# Patient Record
Sex: Female | Born: 1975
Health system: Southern US, Community
[De-identification: ages and names within clinical notes are randomized; demographics above are authoritative.]

## PROBLEM LIST (undated history)

## (undated) DIAGNOSIS — R51 Headache: Secondary | ICD-10-CM

## (undated) DIAGNOSIS — G629 Polyneuropathy, unspecified: Secondary | ICD-10-CM

## (undated) DIAGNOSIS — D649 Anemia, unspecified: Secondary | ICD-10-CM

## (undated) DIAGNOSIS — Z923 Personal history of irradiation: Secondary | ICD-10-CM

## (undated) DIAGNOSIS — M549 Dorsalgia, unspecified: Secondary | ICD-10-CM

## (undated) DIAGNOSIS — F329 Major depressive disorder, single episode, unspecified: Secondary | ICD-10-CM

## (undated) DIAGNOSIS — Z9221 Personal history of antineoplastic chemotherapy: Secondary | ICD-10-CM

## (undated) DIAGNOSIS — K259 Gastric ulcer, unspecified as acute or chronic, without hemorrhage or perforation: Secondary | ICD-10-CM

## (undated) DIAGNOSIS — C50919 Malignant neoplasm of unspecified site of unspecified female breast: Secondary | ICD-10-CM

## (undated) DIAGNOSIS — I10 Essential (primary) hypertension: Secondary | ICD-10-CM

## (undated) DIAGNOSIS — F32A Depression, unspecified: Secondary | ICD-10-CM

## (undated) DIAGNOSIS — Z8739 Personal history of other diseases of the musculoskeletal system and connective tissue: Secondary | ICD-10-CM

## (undated) DIAGNOSIS — E119 Type 2 diabetes mellitus without complications: Secondary | ICD-10-CM

## (undated) DIAGNOSIS — R05 Cough: Secondary | ICD-10-CM

## (undated) DIAGNOSIS — K219 Gastro-esophageal reflux disease without esophagitis: Secondary | ICD-10-CM

## (undated) DIAGNOSIS — M1990 Unspecified osteoarthritis, unspecified site: Secondary | ICD-10-CM

## (undated) HISTORY — DX: Depression, unspecified: F32.A

## (undated) HISTORY — DX: Dorsalgia, unspecified: M54.9

## (undated) HISTORY — DX: Malignant neoplasm of unspecified site of unspecified female breast: C50.919

## (undated) HISTORY — DX: Anemia, unspecified: D64.9

## (undated) HISTORY — DX: Personal history of other diseases of the musculoskeletal system and connective tissue: Z87.39

## (undated) HISTORY — PX: HIP PINNING: SHX1757

## (undated) HISTORY — DX: Type 2 diabetes mellitus without complications: E11.9

## (undated) HISTORY — DX: Headache: R51

## (undated) HISTORY — DX: Personal history of antineoplastic chemotherapy: Z92.21

## (undated) HISTORY — DX: Major depressive disorder, single episode, unspecified: F32.9

---

## 2006-09-25 ENCOUNTER — Emergency Department (HOSPITAL_COMMUNITY): Admission: EM | Admit: 2006-09-25 | Discharge: 2006-09-25 | Payer: Self-pay | Admitting: Emergency Medicine

## 2007-04-26 ENCOUNTER — Emergency Department (HOSPITAL_COMMUNITY): Admission: EM | Admit: 2007-04-26 | Discharge: 2007-04-27 | Payer: Self-pay | Admitting: Emergency Medicine

## 2007-05-16 ENCOUNTER — Ambulatory Visit: Payer: Self-pay | Admitting: Internal Medicine

## 2007-05-16 ENCOUNTER — Ambulatory Visit: Payer: Self-pay | Admitting: *Deleted

## 2007-05-16 LAB — CONVERTED CEMR LAB
Albumin: 3.9 g/dL (ref 3.5–5.2)
Alkaline Phosphatase: 87 units/L (ref 39–117)
BUN: 12 mg/dL (ref 6–23)
Glucose, Bld: 90 mg/dL (ref 70–99)
Sodium: 141 meq/L (ref 135–145)
Total Bilirubin: 0.6 mg/dL (ref 0.3–1.2)
Total CHOL/HDL Ratio: 3.7

## 2007-05-30 ENCOUNTER — Ambulatory Visit: Payer: Self-pay | Admitting: Internal Medicine

## 2007-06-27 ENCOUNTER — Ambulatory Visit: Payer: Self-pay | Admitting: Internal Medicine

## 2007-07-10 ENCOUNTER — Ambulatory Visit: Payer: Self-pay | Admitting: Family Medicine

## 2007-08-10 ENCOUNTER — Ambulatory Visit: Payer: Self-pay | Admitting: Internal Medicine

## 2007-08-10 LAB — CONVERTED CEMR LAB
Eosinophils Relative: 6 % — ABNORMAL HIGH (ref 0–5)
HCT: 35.2 % — ABNORMAL LOW (ref 36.0–46.0)
Lymphocytes Relative: 33 % (ref 12–46)
Lymphs Abs: 1.9 10*3/uL (ref 0.7–4.0)
Monocytes Absolute: 0.5 10*3/uL (ref 0.1–1.0)
RBC: 4.55 M/uL (ref 3.87–5.11)
TSH: 1.484 microintl units/mL (ref 0.350–5.50)

## 2007-08-13 ENCOUNTER — Ambulatory Visit: Payer: Self-pay | Admitting: Family Medicine

## 2007-09-03 ENCOUNTER — Ambulatory Visit: Payer: Self-pay | Admitting: Internal Medicine

## 2007-11-06 ENCOUNTER — Ambulatory Visit: Payer: Self-pay | Admitting: Internal Medicine

## 2008-02-20 ENCOUNTER — Emergency Department (HOSPITAL_COMMUNITY): Admission: EM | Admit: 2008-02-20 | Discharge: 2008-02-20 | Payer: Self-pay | Admitting: Emergency Medicine

## 2008-05-05 ENCOUNTER — Ambulatory Visit: Payer: Self-pay | Admitting: Internal Medicine

## 2008-05-05 LAB — CONVERTED CEMR LAB
Chlamydia, Swab/Urine, PCR: NEGATIVE
GC Probe Amp, Urine: NEGATIVE

## 2008-05-06 ENCOUNTER — Encounter (INDEPENDENT_AMBULATORY_CARE_PROVIDER_SITE_OTHER): Payer: Self-pay | Admitting: Internal Medicine

## 2008-05-26 ENCOUNTER — Ambulatory Visit: Payer: Self-pay | Admitting: Internal Medicine

## 2008-06-27 ENCOUNTER — Encounter (INDEPENDENT_AMBULATORY_CARE_PROVIDER_SITE_OTHER): Payer: Self-pay | Admitting: Adult Health

## 2008-06-27 ENCOUNTER — Ambulatory Visit: Payer: Self-pay | Admitting: Internal Medicine

## 2008-06-27 LAB — CONVERTED CEMR LAB
ALT: 13 units/L (ref 0–35)
Alkaline Phosphatase: 85 units/L (ref 39–117)
BUN: 11 mg/dL (ref 6–23)
Eosinophils Absolute: 0.2 10*3/uL (ref 0.0–0.7)
Eosinophils Relative: 4 % (ref 0–5)
Glucose, Bld: 68 mg/dL — ABNORMAL LOW (ref 70–99)
Lymphocytes Relative: 32 % (ref 12–46)
Lymphs Abs: 1.8 10*3/uL (ref 0.7–4.0)
MCV: 77.9 fL — ABNORMAL LOW (ref 78.0–100.0)
Neutro Abs: 3 10*3/uL (ref 1.7–7.7)
Total Bilirubin: 0.5 mg/dL (ref 0.3–1.2)
Total CHOL/HDL Ratio: 3.4
Triglycerides: 69 mg/dL (ref <150)

## 2008-10-13 ENCOUNTER — Ambulatory Visit: Payer: Self-pay | Admitting: Internal Medicine

## 2008-10-13 ENCOUNTER — Encounter (INDEPENDENT_AMBULATORY_CARE_PROVIDER_SITE_OTHER): Payer: Self-pay | Admitting: Adult Health

## 2008-10-13 LAB — CONVERTED CEMR LAB
ALT: 10 units/L (ref 0–35)
AST: 14 units/L (ref 0–37)
Basophils Absolute: 0 10*3/uL (ref 0.0–0.1)
Basophils Relative: 1 % (ref 0–1)
Chloride: 108 meq/L (ref 96–112)
Cortisol, Plasma: 5.8 ug/dL
Creatinine, Ser: 0.82 mg/dL (ref 0.40–1.20)
Eosinophils Absolute: 0.2 10*3/uL (ref 0.0–0.7)
HCT: 35.9 % — ABNORMAL LOW (ref 36.0–46.0)
Lymphocytes Relative: 28 % (ref 12–46)
Monocytes Absolute: 0.5 10*3/uL (ref 0.1–1.0)
Neutro Abs: 3.1 10*3/uL (ref 1.7–7.7)
Platelets: 366 10*3/uL (ref 150–400)
RBC: 4.53 M/uL (ref 3.87–5.11)
Sodium: 141 meq/L (ref 135–145)
TSH: 1.831 microintl units/mL (ref 0.350–4.50)
Total Protein: 7.1 g/dL (ref 6.0–8.3)

## 2009-01-02 ENCOUNTER — Inpatient Hospital Stay (HOSPITAL_COMMUNITY): Admission: AD | Admit: 2009-01-02 | Discharge: 2009-01-02 | Payer: Self-pay | Admitting: Obstetrics & Gynecology

## 2009-01-12 ENCOUNTER — Ambulatory Visit: Payer: Self-pay | Admitting: Internal Medicine

## 2009-02-12 ENCOUNTER — Ambulatory Visit: Payer: Self-pay | Admitting: Internal Medicine

## 2009-03-30 ENCOUNTER — Ambulatory Visit: Payer: Self-pay | Admitting: Family Medicine

## 2009-03-31 ENCOUNTER — Encounter (INDEPENDENT_AMBULATORY_CARE_PROVIDER_SITE_OTHER): Payer: Self-pay | Admitting: Adult Health

## 2009-07-01 ENCOUNTER — Ambulatory Visit: Payer: Self-pay | Admitting: Family Medicine

## 2009-07-01 ENCOUNTER — Encounter (INDEPENDENT_AMBULATORY_CARE_PROVIDER_SITE_OTHER): Payer: Self-pay | Admitting: Adult Health

## 2009-07-01 LAB — CONVERTED CEMR LAB
Albumin: 4 g/dL (ref 3.5–5.2)
Basophils Absolute: 0 10*3/uL (ref 0.0–0.1)
Basophils Relative: 1 % (ref 0–1)
CO2: 24 meq/L (ref 19–32)
Calcium: 9.5 mg/dL (ref 8.4–10.5)
Cholesterol: 176 mg/dL (ref 0–200)
Creatinine, Ser: 0.81 mg/dL (ref 0.40–1.20)
HCT: 36.5 % (ref 36.0–46.0)
HDL: 39 mg/dL — ABNORMAL LOW (ref >39)
MCHC: 30.1 g/dL (ref 30.0–36.0)
MCV: 81.1 fL (ref 78.0–100.0)
Monocytes Relative: 12 % (ref 3–12)
Neutrophils Relative %: 54 % (ref 43–77)
Platelets: 386 10*3/uL (ref 150–400)
Potassium: 4.4 meq/L (ref 3.5–5.3)
RBC: 4.5 M/uL (ref 3.87–5.11)
Total CHOL/HDL Ratio: 4.5
Total Protein: 7.1 g/dL (ref 6.0–8.3)
Triglycerides: 76 mg/dL (ref <150)
VLDL: 15 mg/dL (ref 0–40)

## 2009-09-03 ENCOUNTER — Ambulatory Visit: Payer: Self-pay | Admitting: Internal Medicine

## 2009-10-16 ENCOUNTER — Encounter (INDEPENDENT_AMBULATORY_CARE_PROVIDER_SITE_OTHER): Payer: Self-pay | Admitting: Adult Health

## 2009-10-16 ENCOUNTER — Ambulatory Visit: Payer: Self-pay | Admitting: Internal Medicine

## 2009-10-16 LAB — CONVERTED CEMR LAB
CO2: 27 meq/L (ref 19–32)
Potassium: 4.3 meq/L (ref 3.5–5.3)
Sodium: 141 meq/L (ref 135–145)

## 2009-11-11 ENCOUNTER — Inpatient Hospital Stay (HOSPITAL_COMMUNITY): Admission: AD | Admit: 2009-11-11 | Discharge: 2009-11-11 | Payer: Self-pay | Admitting: Obstetrics & Gynecology

## 2010-01-29 ENCOUNTER — Ambulatory Visit: Payer: Self-pay | Admitting: Adult Health

## 2010-02-05 ENCOUNTER — Ambulatory Visit: Payer: Self-pay | Admitting: Family Medicine

## 2010-02-11 ENCOUNTER — Ambulatory Visit: Payer: Self-pay | Admitting: Internal Medicine

## 2010-02-24 ENCOUNTER — Ambulatory Visit: Payer: Self-pay | Admitting: Internal Medicine

## 2010-02-24 ENCOUNTER — Encounter (INDEPENDENT_AMBULATORY_CARE_PROVIDER_SITE_OTHER): Payer: Self-pay | Admitting: Adult Health

## 2010-02-24 LAB — CONVERTED CEMR LAB
CO2: 24 meq/L (ref 19–32)
Calcium: 9.3 mg/dL (ref 8.4–10.5)
Chloride: 107 meq/L (ref 96–112)
Creatinine, Ser: 0.79 mg/dL (ref 0.40–1.20)
Glucose, Bld: 87 mg/dL (ref 70–99)
Potassium: 4.5 meq/L (ref 3.5–5.3)

## 2010-03-17 ENCOUNTER — Ambulatory Visit: Payer: Self-pay | Admitting: Internal Medicine

## 2010-04-07 ENCOUNTER — Ambulatory Visit: Payer: Self-pay | Admitting: Family Medicine

## 2010-08-22 DIAGNOSIS — C50919 Malignant neoplasm of unspecified site of unspecified female breast: Secondary | ICD-10-CM

## 2010-08-22 HISTORY — DX: Malignant neoplasm of unspecified site of unspecified female breast: C50.919

## 2010-09-12 ENCOUNTER — Encounter: Payer: Self-pay | Admitting: Internal Medicine

## 2010-10-26 ENCOUNTER — Emergency Department (HOSPITAL_COMMUNITY)
Admission: EM | Admit: 2010-10-26 | Discharge: 2010-10-27 | Disposition: A | Discharge status: Home / Self Care | Payer: BC Managed Care – PPO | Attending: Emergency Medicine | Admitting: Emergency Medicine

## 2010-10-26 DIAGNOSIS — R51 Headache: Secondary | ICD-10-CM | POA: Insufficient documentation

## 2010-10-26 DIAGNOSIS — I10 Essential (primary) hypertension: Secondary | ICD-10-CM | POA: Insufficient documentation

## 2010-10-26 LAB — CBC
HCT: 32.6 % — ABNORMAL LOW (ref 36.0–46.0)
MCHC: 31 g/dL (ref 30.0–36.0)
MCV: 75.3 fL — ABNORMAL LOW (ref 78.0–100.0)
Platelets: 392 10*3/uL (ref 150–400)
WBC: 5.7 10*3/uL (ref 4.0–10.5)

## 2010-10-26 LAB — DIFFERENTIAL
Lymphs Abs: 2 10*3/uL (ref 0.7–4.0)
Monocytes Relative: 11 % (ref 3–12)

## 2010-10-26 LAB — COMPREHENSIVE METABOLIC PANEL
ALT: 16 U/L (ref 0–35)
BUN: 12 mg/dL (ref 6–23)
CO2: 27 mEq/L (ref 19–32)
GFR calc Af Amer: >60 mL/min (ref >60)
Glucose, Bld: 81 mg/dL (ref 70–99)
Sodium: 141 mEq/L (ref 135–145)

## 2010-10-27 LAB — POCT PREGNANCY, URINE: Preg Test, Ur: NEGATIVE

## 2010-10-27 LAB — URINE MICROSCOPIC-ADD ON

## 2010-10-27 LAB — URINALYSIS, ROUTINE W REFLEX MICROSCOPIC
Bilirubin Urine: NEGATIVE
Glucose, UA: NEGATIVE mg/dL
Protein, ur: NEGATIVE mg/dL

## 2010-11-15 LAB — URINALYSIS, ROUTINE W REFLEX MICROSCOPIC
Bilirubin Urine: NEGATIVE
Glucose, UA: NEGATIVE mg/dL
Hgb urine dipstick: NEGATIVE
Ketones, ur: NEGATIVE mg/dL
Protein, ur: NEGATIVE mg/dL
pH: 6.5 (ref 5.0–8.0)

## 2010-11-15 LAB — DIFFERENTIAL
Basophils Relative: 1 % (ref 0–1)
Eosinophils Relative: 4 % (ref 0–5)
Monocytes Relative: 11 % (ref 3–12)
Neutrophils Relative %: 65 % (ref 43–77)

## 2010-11-15 LAB — WET PREP, GENITAL
Trich, Wet Prep: NONE SEEN
Yeast Wet Prep HPF POC: NONE SEEN

## 2010-11-15 LAB — CBC
HCT: 31.6 % — ABNORMAL LOW (ref 36.0–46.0)
Hemoglobin: 10.3 g/dL — ABNORMAL LOW (ref 12.0–15.0)
MCHC: 32.5 g/dL (ref 30.0–36.0)
Platelets: 279 10*3/uL (ref 150–400)
RBC: 4.02 MIL/uL (ref 3.87–5.11)
RDW: 15.7 % — ABNORMAL HIGH (ref 11.5–15.5)

## 2010-11-30 LAB — ABO/RH: ABO/RH(D): O POS

## 2010-11-30 LAB — CBC
MCHC: 32.8 g/dL (ref 30.0–36.0)
MCV: 78.3 fL (ref 78.0–100.0)
Platelets: 339 10*3/uL (ref 150–400)
RDW: 14.8 % (ref 11.5–15.5)

## 2010-11-30 LAB — URINALYSIS, ROUTINE W REFLEX MICROSCOPIC
Leukocytes, UA: NEGATIVE
Protein, ur: 30 mg/dL — AB
Specific Gravity, Urine: 1.025 (ref 1.005–1.030)
Urobilinogen, UA: 0.2 mg/dL (ref 0.0–1.0)

## 2010-11-30 LAB — URINE MICROSCOPIC-ADD ON

## 2010-11-30 LAB — HCG, QUANTITATIVE, PREGNANCY: hCG, Beta Chain, Quant, S: 21 m[IU]/mL — ABNORMAL HIGH (ref <5)

## 2010-11-30 LAB — GC/CHLAMYDIA PROBE AMP, GENITAL: Chlamydia, DNA Probe: NEGATIVE

## 2011-02-14 ENCOUNTER — Emergency Department (HOSPITAL_COMMUNITY)
Admission: EM | Admit: 2011-02-14 | Discharge: 2011-02-14 | Disposition: A | Discharge status: Home / Self Care | Payer: Medicaid Other | Attending: Emergency Medicine | Admitting: Emergency Medicine

## 2011-02-14 DIAGNOSIS — O9989 Other specified diseases and conditions complicating pregnancy, childbirth and the puerperium: Secondary | ICD-10-CM | POA: Insufficient documentation

## 2011-02-14 DIAGNOSIS — R51 Headache: Secondary | ICD-10-CM | POA: Insufficient documentation

## 2011-02-14 DIAGNOSIS — O169 Unspecified maternal hypertension, unspecified trimester: Secondary | ICD-10-CM | POA: Insufficient documentation

## 2011-02-14 DIAGNOSIS — Z79899 Other long term (current) drug therapy: Secondary | ICD-10-CM | POA: Insufficient documentation

## 2011-02-27 ENCOUNTER — Inpatient Hospital Stay (HOSPITAL_COMMUNITY): Payer: Medicaid Other | Admitting: Obstetrics & Gynecology

## 2011-02-27 ENCOUNTER — Inpatient Hospital Stay (HOSPITAL_COMMUNITY): Payer: Medicaid Other

## 2011-02-27 ENCOUNTER — Other Ambulatory Visit: Payer: Self-pay | Admitting: Obstetrics and Gynecology

## 2011-02-27 ENCOUNTER — Encounter (HOSPITAL_COMMUNITY): Payer: Self-pay

## 2011-02-27 ENCOUNTER — Inpatient Hospital Stay (HOSPITAL_COMMUNITY)
Admission: AD | Admit: 2011-02-27 | Discharge: 2011-02-28 | DRG: 779 | Disposition: A | Discharge status: Home / Self Care | Payer: Medicaid Other | Source: Ambulatory Visit | Attending: Obstetrics and Gynecology | Admitting: Obstetrics and Gynecology

## 2011-02-27 DIAGNOSIS — O034 Incomplete spontaneous abortion without complication: Principal | ICD-10-CM | POA: Diagnosis present

## 2011-02-27 DIAGNOSIS — O3430 Maternal care for cervical incompetence, unspecified trimester: Secondary | ICD-10-CM | POA: Diagnosis present

## 2011-02-27 DIAGNOSIS — O99019 Anemia complicating pregnancy, unspecified trimester: Secondary | ICD-10-CM | POA: Diagnosis present

## 2011-02-27 DIAGNOSIS — O10019 Pre-existing essential hypertension complicating pregnancy, unspecified trimester: Secondary | ICD-10-CM | POA: Diagnosis present

## 2011-02-27 DIAGNOSIS — D649 Anemia, unspecified: Secondary | ICD-10-CM | POA: Diagnosis present

## 2011-02-27 DIAGNOSIS — I10 Essential (primary) hypertension: Secondary | ICD-10-CM

## 2011-02-27 HISTORY — DX: Essential (primary) hypertension: I10

## 2011-02-27 LAB — COMPREHENSIVE METABOLIC PANEL
AST: 15 U/L (ref 0–37)
CO2: 22 mEq/L (ref 19–32)
Chloride: 105 mEq/L (ref 96–112)
Creatinine, Ser: 0.55 mg/dL (ref 0.50–1.10)
GFR calc non Af Amer: >60 mL/min (ref >60)
Glucose, Bld: 80 mg/dL (ref 70–99)
Total Bilirubin: 0.3 mg/dL (ref 0.3–1.2)

## 2011-02-27 LAB — DIFFERENTIAL
Basophils Absolute: 0 10*3/uL (ref 0.0–0.1)
Lymphocytes Relative: 18 % (ref 12–46)
Lymphs Abs: 1 10*3/uL (ref 0.7–4.0)
Monocytes Absolute: 0.4 10*3/uL (ref 0.1–1.0)
Monocytes Relative: 8 % (ref 3–12)
Neutro Abs: 3.8 10*3/uL (ref 1.7–7.7)

## 2011-02-27 LAB — CBC
HCT: 31.3 % — ABNORMAL LOW (ref 36.0–46.0)
Hemoglobin: 9.8 g/dL — ABNORMAL LOW (ref 12.0–15.0)
RBC: 4.07 MIL/uL (ref 3.87–5.11)
RDW: 15.5 % (ref 11.5–15.5)
WBC: 5.4 10*3/uL (ref 4.0–10.5)

## 2011-02-27 MED ORDER — DIPHENHYDRAMINE HCL 25 MG PO CAPS: 25.0000 mg | ORAL_CAPSULE | Freq: Four times a day (QID) | ORAL | Status: DC | PRN

## 2011-02-27 MED ORDER — TETANUS-DIPHTH-ACELL PERTUSSIS 5-2.5-18.5 LF-MCG/0.5 IM SUSP
0.5000 mL | Freq: Once | INTRAMUSCULAR | Status: AC
  Administered 2011-02-28: 0.5 mL via INTRAMUSCULAR
  Filled 2011-02-27: qty 0.5

## 2011-02-27 MED ORDER — SENNOSIDES-DOCUSATE SODIUM 8.6-50 MG PO TABS
1.0000 | ORAL_TABLET | Freq: Every day | ORAL | Status: DC
  Administered 2011-02-27: 2 via ORAL
  Filled 2011-02-27: qty 2

## 2011-02-27 MED ORDER — ONDANSETRON HCL 4 MG/2ML IJ SOLN: 4.0000 mg | INTRAMUSCULAR | Status: DC | PRN

## 2011-02-27 MED ORDER — FERROUS SULFATE 325 (65 FE) MG PO TABS
325.0000 mg | ORAL_TABLET | Freq: Two times a day (BID) | ORAL | Status: DC
  Administered 2011-02-27: 325 mg via ORAL
  Filled 2011-02-27: qty 1

## 2011-02-27 MED ORDER — OXYTOCIN 20 UNITS IN LACTATED RINGERS INFUSION - SIMPLE
125.0000 mL/h | INTRAVENOUS | Status: DC
  Filled 2011-02-27: qty 1000

## 2011-02-27 MED ORDER — SIMETHICONE 80 MG PO CHEW: 80.0000 mg | CHEWABLE_TABLET | ORAL | Status: DC | PRN

## 2011-02-27 MED ORDER — BENZOCAINE-MENTHOL 20-0.5 % EX AERO: 1.0000 "application " | INHALATION_SPRAY | CUTANEOUS | Status: DC | PRN

## 2011-02-27 MED ORDER — SODIUM CHLORIDE 0.9 % IJ SOLN
3.0000 mL | INTRAMUSCULAR | Status: DC | PRN
  Administered 2011-02-27: 3 mL via INTRAVENOUS

## 2011-02-27 MED ORDER — OXYTOCIN 20 UNITS IN LACTATED RINGERS INFUSION - SIMPLE
INTRAVENOUS | Status: AC
  Filled 2011-02-27: qty 1000

## 2011-02-27 MED ORDER — KETOROLAC TROMETHAMINE 30 MG/ML IM SOLN
30.0000 mg | Freq: Four times a day (QID) | INTRAMUSCULAR | Status: DC | PRN
  Filled 2011-02-27: qty 1

## 2011-02-27 MED ORDER — OXYCODONE-ACETAMINOPHEN 5-325 MG PO TABS: 1.0000 | ORAL_TABLET | ORAL | Status: DC | PRN

## 2011-02-27 MED ORDER — ONDANSETRON HCL 4 MG PO TABS: 4.0000 mg | ORAL_TABLET | ORAL | Status: DC | PRN

## 2011-02-27 MED ORDER — KETOROLAC TROMETHAMINE 30 MG/ML IJ SOLN
INTRAMUSCULAR | Status: AC
  Administered 2011-02-27: 30 mg via INTRAVENOUS
  Filled 2011-02-27: qty 1

## 2011-02-27 MED ORDER — WITCH HAZEL-GLYCERIN EX PADS: MEDICATED_PAD | CUTANEOUS | Status: DC | PRN

## 2011-02-27 MED ORDER — PRENATAL PLUS 27-1 MG PO TABS: 1.0000 | ORAL_TABLET | Freq: Every day | ORAL | Status: DC

## 2011-02-27 MED ORDER — OXYTOCIN 20 UNITS IN LACTATED RINGERS INFUSION - SIMPLE
20.0000 [IU] | INTRAVENOUS | Status: DC
  Administered 2011-02-27: 20 [IU] via INTRAVENOUS
  Filled 2011-02-27: qty 1000

## 2011-02-27 MED ORDER — IBUPROFEN 600 MG PO TABS
600.0000 mg | ORAL_TABLET | Freq: Four times a day (QID) | ORAL | Status: DC
  Administered 2011-02-27 – 2011-02-28 (×3): 600 mg via ORAL
  Filled 2011-02-27 (×3): qty 1

## 2011-02-27 MED ORDER — SODIUM CHLORIDE 0.9 % IJ SOLN: 3.0000 mL | Freq: Two times a day (BID) | INTRAMUSCULAR | Status: DC

## 2011-02-27 MED ORDER — METHYLDOPA 500 MG PO TABS
500.0000 mg | ORAL_TABLET | Freq: Three times a day (TID) | ORAL | Status: DC
  Administered 2011-02-27 – 2011-02-28 (×3): 500 mg via ORAL
  Filled 2011-02-27: qty 1

## 2011-02-27 MED ORDER — OXYTOCIN 20 UNITS IN LACTATED RINGERS INFUSION - SIMPLE: 125.0000 mL/h | INTRAVENOUS | Status: DC

## 2011-02-27 MED ORDER — ZOLPIDEM TARTRATE 5 MG PO TABS: 5.0000 mg | ORAL_TABLET | Freq: Every evening | ORAL | Status: DC | PRN

## 2011-02-27 MED ORDER — PRENATAL PLUS 27-1 MG PO TABS
1.0000 | ORAL_TABLET | Freq: Every day | ORAL | Status: DC
  Administered 2011-02-27 – 2011-02-28 (×2): 1 via ORAL
  Filled 2011-02-27 (×2): qty 1

## 2011-02-27 NOTE — Progress Notes (Signed)
Got up to go to the bathroom and felt blood running down leg, no pain, constipation small BM last night

## 2011-02-27 NOTE — Plan of Care (Signed)
Problem: Phase II Progression Outcomes Goal: Progress activity as tolerated unless otherwise ordered Outcome: Progressing Patient ambulated to bathroom with assist.Patient stated was a little dizzy but tolerated well Goal: Rh isoimmunization per orders Outcome: Not Applicable Patient is A+ and does not need Rhogam Goal: Tolerating diet Outcome: Not Progressing Patient is upset over loss of infant and does not want to eat.Patient was encouraged to at least drink juice so she was given grape juice and crackers and is tolerating the juice
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
## 2011-02-27 NOTE — Plan of Care (Signed)
Problem: Phase II Progression Outcomes Goal: Rh isoimmunization per orders Outcome: Completed/Met Date Met:  02/27/11 Patient is A+ does not require Rhogam

## 2011-02-27 NOTE — ED Notes (Signed)
Pt arrived in MAU via EMS, c/o vag bleeding at home.  Eustace Pen, CNM in to see pt, performed spec exam, noted bulging BOW at vaginal introitus.  U/s called and in to perform bedside u/s.  Pt spontaneously delivered fetus during u/s.  Marlynn Perking CNM in during delivery, Steelman called to come to bedside, who notified Dr. Estanislado Pandy.  Delivery of non-viable fetus occurred at 1355 with no signs of life.  Dr. Estanislado Pandy in to see pt, assessed status of placenta.  IV started with 18 g in right AC with LR bolus.  Toradol given at 1415 for c/o pain/cramping.  New bag of IVF of LR with 20 u Pitocin hung to infuse at bolus rate.  Delivered placenta at 1300, clean linens given, pericare given.  Bleeding reassessed to be small amt bright red.  Report given to Selena Batten, RN in [employer].  Pt taken to room 305 at 1540 via stretcher.
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
## 2011-02-27 NOTE — Progress Notes (Signed)
S: Called by CNM to come see pt who arrived by EMS with complaint of painless bleeding at 18 weeks.  Upon arrival  membranes were bulging at introitus.Before ultrasound could be performed, patient passed fetus at 13:55. 3 tight nuchal cord loops were noted. Cord was clamped and cut.  Upon my arrival, pt is c/o cramping. Mild to moderate bleeding.   O: Speculum exam revealed clots++++.   Placenta in cervical os removed with ring forceps.  No active bleeding noted.  A: Unexplained 2nd trimester fetal loss c/w  Cervical incompetence  P: Admit to Women's Unit  CBC, CMP  Placenta to pathology to r/o chorioamnionitis

## 2011-02-28 DIAGNOSIS — I10 Essential (primary) hypertension: Secondary | ICD-10-CM | POA: Insufficient documentation

## 2011-02-28 LAB — CBC
MCHC: 31.9 g/dL (ref 30.0–36.0)
MCV: 76.9 fL — ABNORMAL LOW (ref 78.0–100.0)
Platelets: 250 10*3/uL (ref 150–400)
RDW: 15.6 % — ABNORMAL HIGH (ref 11.5–15.5)
WBC: 5.8 10*3/uL (ref 4.0–10.5)

## 2011-02-28 MED ORDER — SENNOSIDES-DOCUSATE SODIUM 8.6-50 MG PO TABS: 1.0000 | ORAL_TABLET | Freq: Every day | ORAL | Status: DC

## 2011-02-28 MED ORDER — IBUPROFEN 600 MG PO TABS: 600.0000 mg | ORAL_TABLET | Freq: Four times a day (QID) | ORAL | Status: AC

## 2011-02-28 MED ORDER — BENZOCAINE-MENTHOL 20-0.5 % EX AERO: 1.0000 "application " | INHALATION_SPRAY | CUTANEOUS | Status: DC | PRN

## 2011-02-28 MED ORDER — FERROUS SULFATE 325 (65 FE) MG PO TABS: 325.0000 mg | ORAL_TABLET | Freq: Two times a day (BID) | ORAL | Status: DC

## 2011-02-28 MED ORDER — METHYLDOPA 500 MG PO TABS: 500.0000 mg | ORAL_TABLET | Freq: Three times a day (TID) | ORAL | Status: DC

## 2011-03-13 ENCOUNTER — Emergency Department (HOSPITAL_COMMUNITY)
Admission: EM | Admit: 2011-03-13 | Discharge: 2011-03-14 | Disposition: A | Discharge status: Home / Self Care | Payer: Medicaid Other | Attending: Emergency Medicine | Admitting: Emergency Medicine

## 2011-03-13 DIAGNOSIS — Z79899 Other long term (current) drug therapy: Secondary | ICD-10-CM | POA: Insufficient documentation

## 2011-03-13 DIAGNOSIS — J3489 Other specified disorders of nose and nasal sinuses: Secondary | ICD-10-CM | POA: Insufficient documentation

## 2011-03-13 DIAGNOSIS — R51 Headache: Secondary | ICD-10-CM | POA: Insufficient documentation

## 2011-03-13 DIAGNOSIS — I10 Essential (primary) hypertension: Secondary | ICD-10-CM | POA: Insufficient documentation

## 2011-03-14 ENCOUNTER — Other Ambulatory Visit: Payer: Self-pay | Admitting: Radiology

## 2011-03-15 ENCOUNTER — Other Ambulatory Visit: Payer: Self-pay | Admitting: Radiology

## 2011-03-15 DIAGNOSIS — C50911 Malignant neoplasm of unspecified site of right female breast: Secondary | ICD-10-CM

## 2011-03-16 ENCOUNTER — Other Ambulatory Visit (INDEPENDENT_AMBULATORY_CARE_PROVIDER_SITE_OTHER): Payer: Self-pay | Admitting: General Surgery

## 2011-03-16 DIAGNOSIS — C50919 Malignant neoplasm of unspecified site of unspecified female breast: Secondary | ICD-10-CM | POA: Insufficient documentation

## 2011-03-18 ENCOUNTER — Ambulatory Visit
Admission: RE | Admit: 2011-03-18 | Discharge: 2011-03-18 | Disposition: A | Discharge status: Home / Self Care | Payer: Medicaid Other | Source: Ambulatory Visit | Attending: Radiology | Admitting: Radiology

## 2011-03-18 DIAGNOSIS — C50911 Malignant neoplasm of unspecified site of right female breast: Secondary | ICD-10-CM

## 2011-03-18 MED ORDER — GADOBENATE DIMEGLUMINE 529 MG/ML IV SOLN
20.0000 mL | Freq: Once | INTRAVENOUS | Status: AC | PRN
  Administered 2011-03-18: 20 mL via INTRAVENOUS

## 2011-03-22 NOTE — Discharge Summary (Signed)
  Obstetric Discharge Summary Reason for Admission: onset of labor Prenatal Procedures: ultrasound Intrapartum Procedures: spontaneous vaginal delivery of non-viable 18.1 wk fetus Postpartum Procedures: none Complications-Operative and Postpartum: none    Hemoglobin  Date Value Range Status  02/28/2011 8.2* 12.0-15.0 (g/dL) Final     HCT  Date Value Range Status  02/28/2011 25.7* 36.0-46.0 (%) Final    Hospital Course:  Presented to MAU by EMS with BRVB. SVD in MAU of non-viable 18.1 wk fetus. Desired d/c home next day after normal pp course.   Discharge Diagnoses: Premature labor and SVD @ 18.1 wks  Discharge Information: Date: 03/22/2011 Activity: pelvic rest Diet: routine Medications:  Medication List  As of 03/22/2011  8:46 PM   START taking these medications         benzocaine-Menthol 20-0.5 % Aero   Commonly known as: DERMOPLAST   Apply 1 application topically as needed (perineal discomfort).      ferrous sulfate 325 (65 FE) MG tablet   Take 1 tablet (325 mg total) by mouth 2 (two) times daily with a meal.      methyldopa 500 MG tablet   Commonly known as: ALDOMET   Take 1 tablet (500 mg total) by mouth 3 (three) times daily.      senna-docusate 8.6-50 MG per tablet   Commonly known as: Senokot-S   Take 1-2 tablets by mouth at bedtime.          Where to get your medications    These are the prescriptions that you need to pick up.   You may get these medications from any pharmacy.         benzocaine-Menthol 20-0.5 % Aero   ferrous sulfate 325 (65 FE) MG tablet   methyldopa 500 MG tablet   senna-docusate 8.6-50 MG per tablet           Condition: stable Instructions: refer to practice specific booklet Discharge to: home   Newborn Data: Non-viable This patient has no babies on file.; APGAR 0/0 , ; weight ;  Home with Infant non-viable.  [name] 03/22/2011, 8:46 PM

## 2011-03-23 ENCOUNTER — Other Ambulatory Visit: Payer: Self-pay | Admitting: Oncology

## 2011-03-23 ENCOUNTER — Ambulatory Visit (HOSPITAL_BASED_OUTPATIENT_CLINIC_OR_DEPARTMENT_OTHER): Payer: Medicaid Other | Admitting: General Surgery

## 2011-03-23 ENCOUNTER — Encounter (INDEPENDENT_AMBULATORY_CARE_PROVIDER_SITE_OTHER): Payer: Self-pay | Admitting: General Surgery

## 2011-03-23 ENCOUNTER — Encounter (HOSPITAL_BASED_OUTPATIENT_CLINIC_OR_DEPARTMENT_OTHER): Payer: Medicaid Other | Admitting: Oncology

## 2011-03-23 VITALS — BP 170/117 | HR 74 | Temp 98.7°F | Resp 20 | Ht 66.5 in | Wt 297.1 lb

## 2011-03-23 DIAGNOSIS — C50919 Malignant neoplasm of unspecified site of unspecified female breast: Secondary | ICD-10-CM

## 2011-03-23 DIAGNOSIS — C50419 Malignant neoplasm of upper-outer quadrant of unspecified female breast: Secondary | ICD-10-CM

## 2011-03-23 LAB — CBC WITH DIFFERENTIAL/PLATELET
BASO%: 0.5 % (ref 0.0–2.0)
EOS%: 7.4 % — ABNORMAL HIGH (ref 0.0–7.0)
HCT: 32.3 % — ABNORMAL LOW (ref 34.8–46.6)
LYMPH%: 28.2 % (ref 14.0–49.7)
MCH: 23.7 pg — ABNORMAL LOW (ref 25.1–34.0)
MCHC: 31 g/dL — ABNORMAL LOW (ref 31.5–36.0)
NEUT%: 55.3 % (ref 38.4–76.8)
RBC: 4.22 10*6/uL (ref 3.70–5.45)
lymph#: 1.2 10*3/uL (ref 0.9–3.3)

## 2011-03-23 LAB — COMPREHENSIVE METABOLIC PANEL
ALT: 12 U/L (ref 0–35)
AST: 17 U/L (ref 0–37)
Creatinine, Ser: 0.75 mg/dL (ref 0.50–1.10)
Sodium: 139 mEq/L (ref 135–145)
Total Bilirubin: 0.5 mg/dL (ref 0.3–1.2)

## 2011-03-23 NOTE — Progress Notes (Signed)
Subjective:     Patient ID: [name], female   DOB: February 15, 1976, 35 y.o.   MRN: [id number]  HPI We are asked to see the patient in consultation by Dr. Isabell Jarvis to evaluate her for a right breast cancer. The patient is a 35 year old black female who recently felt a large mass in the upper outer aspect of the right breast. She has had some discomfort associated with it. She has not had any discharge from her nipple in the last 2-3 years and her previous discharge was whitish. The mass was biopsied and came back as invasive ductal cancer. She has not had any breast problems in the past. She denies any nausea or vomiting. No fevers or chills. No chest pain or shortness of breath.  Review of Systems  Constitutional: Negative.   HENT: Negative.   Eyes: Negative.   Respiratory: Negative.   Cardiovascular: Negative.   Gastrointestinal: Negative.   Genitourinary: Negative.   Musculoskeletal: Negative.   Skin: Negative.   Neurological: Negative.   Hematological: Negative.   Psychiatric/Behavioral: Negative.    Past Medical History  Diagnosis Date  . Hypertension   . Night sweats   . Insomnia   . Breast pain   . Abdominal pain   . Neck pain   . Back pain   . Wears glasses   . Sinus complaint   . Sore throat   . Hoarseness of voice   . Heart palpitations   . Shortness of breath   . Nausea & vomiting   . Heartburn   . Ulcer of abdomen wall   . Headache   . Depression   . Anemia    Past Surgical History  Procedure Date  . Hip pins 35 y/o    pins placed in both hips    Current outpatient prescriptions:methyldopa (ALDOMET) 500 MG tablet, Take 1 tablet (500 mg total) by mouth 3 (three) times daily., Disp: 90 tablet, Rfl: 1;  ranitidine (ZANTAC) 15 MG/ML syrup, Take 50 mg by mouth 2 (two) times daily.  , Disp: , Rfl: ;  benzocaine-Menthol (DERMOPLAST) 20-0.5 % AERO, Apply 1 application topically as needed (perineal discomfort)., Disp: 1 each, Rfl: 1 ferrous sulfate 325 (65 FE) MG  tablet, Take 1 tablet (325 mg total) by mouth 2 (two) times daily with a meal., Disp: 60 tablet, Rfl: 1;  senna-docusate (SENOKOT-S) 8.6-50 MG per tablet, Take 1-2 tablets by mouth at bedtime., Disp: 20 tablet, Rfl: 0  No Known Allergies     Objective:   Physical Exam  Constitutional: She is oriented to person, place, and time. She appears well-developed and well-nourished.  HENT:  Head: Normocephalic and atraumatic.  Eyes: Conjunctivae and EOM are normal. Pupils are equal, round, and reactive to light.  Neck: Normal range of motion. Neck supple.  Cardiovascular: Normal rate, regular rhythm and normal heart sounds.   Pulmonary/Chest: Effort normal and breath sounds normal.       She has a large 4-5 cm mass in the upper outer aspect of the right breast. It is mobile and not tethered to the skin or the chest wall. No palpable mass in the left breast. No axillary supraclavicular or cervical lymphadenopathy on either side.  Abdominal: Soft. Bowel sounds are normal.  Musculoskeletal: Normal range of motion.  Neurological: She is alert and oriented to person, place, and time.  Skin: Skin is warm and dry.  Psychiatric: She has a normal mood and affect. Her behavior is normal.       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    Plan:     At this point I think she would be a very good candidate for neoadjuvant chemotherapy. If she has a good response then she may have the option of both breast conservation and mastectomy in the future. She will also need a port for her treatment. I have discussed with her in detail the risk and benefits of the surgery to put the port inas well as some of the technical aspects and she understands and wishes to proceed.

## 2011-03-23 DEATH — deceased

## 2011-03-24 ENCOUNTER — Ambulatory Visit (HOSPITAL_COMMUNITY)
Admission: RE | Admit: 2011-03-24 | Discharge: 2011-03-24 | Disposition: A | Discharge status: Home / Self Care | Payer: Medicaid Other | Source: Ambulatory Visit | Attending: Oncology | Admitting: Oncology

## 2011-03-24 DIAGNOSIS — C50919 Malignant neoplasm of unspecified site of unspecified female breast: Secondary | ICD-10-CM | POA: Insufficient documentation

## 2011-03-24 DIAGNOSIS — Z01810 Encounter for preprocedural cardiovascular examination: Secondary | ICD-10-CM | POA: Insufficient documentation

## 2011-03-28 ENCOUNTER — Encounter: Payer: Medicaid Other | Admitting: Genetic Counselor

## 2011-03-31 ENCOUNTER — Encounter (HOSPITAL_COMMUNITY): Payer: Self-pay

## 2011-03-31 ENCOUNTER — Encounter (HOSPITAL_COMMUNITY)
Admission: RE | Admit: 2011-03-31 | Discharge: 2011-03-31 | Disposition: A | Discharge status: Home / Self Care | Payer: Medicaid Other | Source: Ambulatory Visit | Attending: Oncology | Admitting: Oncology

## 2011-03-31 DIAGNOSIS — C50919 Malignant neoplasm of unspecified site of unspecified female breast: Secondary | ICD-10-CM

## 2011-03-31 DIAGNOSIS — R599 Enlarged lymph nodes, unspecified: Secondary | ICD-10-CM | POA: Insufficient documentation

## 2011-03-31 MED ORDER — IOHEXOL 300 MG/ML  SOLN
100.0000 mL | Freq: Once | INTRAMUSCULAR | Status: AC | PRN
  Administered 2011-03-31: 100 mL via INTRAVENOUS

## 2011-03-31 MED ORDER — FLUDEOXYGLUCOSE F - 18 (FDG) INJECTION
17.0000 | Freq: Once | INTRAVENOUS | Status: AC | PRN
  Administered 2011-03-31: 17 via INTRAVENOUS

## 2011-04-06 ENCOUNTER — Encounter (HOSPITAL_BASED_OUTPATIENT_CLINIC_OR_DEPARTMENT_OTHER)
Admission: RE | Admit: 2011-04-06 | Discharge: 2011-04-06 | Disposition: A | Discharge status: Home / Self Care | Payer: Medicaid Other | Source: Ambulatory Visit | Attending: General Surgery | Admitting: General Surgery

## 2011-04-06 ENCOUNTER — Ambulatory Visit
Admission: RE | Admit: 2011-04-06 | Discharge: 2011-04-06 | Disposition: A | Discharge status: Home / Self Care | Payer: Medicaid Other | Source: Ambulatory Visit | Attending: General Surgery | Admitting: General Surgery

## 2011-04-06 ENCOUNTER — Other Ambulatory Visit (INDEPENDENT_AMBULATORY_CARE_PROVIDER_SITE_OTHER): Payer: Self-pay | Admitting: General Surgery

## 2011-04-06 DIAGNOSIS — Z01818 Encounter for other preprocedural examination: Secondary | ICD-10-CM

## 2011-04-06 LAB — BASIC METABOLIC PANEL
BUN: 10 mg/dL (ref 6–23)
CO2: 27 mEq/L (ref 19–32)
Chloride: 108 mEq/L (ref 96–112)
Glucose, Bld: 114 mg/dL — ABNORMAL HIGH (ref 70–99)
Potassium: 4 mEq/L (ref 3.5–5.1)

## 2011-04-08 ENCOUNTER — Ambulatory Visit (HOSPITAL_BASED_OUTPATIENT_CLINIC_OR_DEPARTMENT_OTHER)
Admission: RE | Admit: 2011-04-08 | Discharge: 2011-04-08 | Disposition: A | Discharge status: Home / Self Care | Payer: Medicaid Other | Source: Ambulatory Visit | Attending: General Surgery | Admitting: General Surgery

## 2011-04-08 ENCOUNTER — Ambulatory Visit (HOSPITAL_COMMUNITY): Payer: Medicaid Other

## 2011-04-08 ENCOUNTER — Ambulatory Visit (HOSPITAL_COMMUNITY): Payer: Medicaid Other | Attending: General Surgery

## 2011-04-08 DIAGNOSIS — E669 Obesity, unspecified: Secondary | ICD-10-CM | POA: Insufficient documentation

## 2011-04-08 DIAGNOSIS — Z01818 Encounter for other preprocedural examination: Secondary | ICD-10-CM | POA: Insufficient documentation

## 2011-04-08 DIAGNOSIS — C50919 Malignant neoplasm of unspecified site of unspecified female breast: Secondary | ICD-10-CM | POA: Insufficient documentation

## 2011-04-08 DIAGNOSIS — I10 Essential (primary) hypertension: Secondary | ICD-10-CM | POA: Insufficient documentation

## 2011-04-08 DIAGNOSIS — Z01810 Encounter for preprocedural cardiovascular examination: Secondary | ICD-10-CM | POA: Insufficient documentation

## 2011-04-08 DIAGNOSIS — Z01812 Encounter for preprocedural laboratory examination: Secondary | ICD-10-CM | POA: Insufficient documentation

## 2011-04-08 HISTORY — PX: PORTACATH PLACEMENT: SHX2246

## 2011-04-08 LAB — POCT HEMOGLOBIN-HEMACUE: Hemoglobin: 9.6 g/dL — ABNORMAL LOW (ref 12.0–15.0)

## 2011-04-13 ENCOUNTER — Encounter (HOSPITAL_BASED_OUTPATIENT_CLINIC_OR_DEPARTMENT_OTHER): Payer: Medicaid Other | Admitting: Oncology

## 2011-04-13 DIAGNOSIS — C50419 Malignant neoplasm of upper-outer quadrant of unspecified female breast: Secondary | ICD-10-CM

## 2011-04-19 NOTE — Op Note (Addendum)
[name], [name]                 ACCOUNT NO.:  [id number]  MEDICAL RECORD NO.:  [id number]  LOCATION:                                 FACILITY:  PHYSICIAN:  [name]. [name], M.D. DATE OF BIRTH:  [birth year]  DATE OF PROCEDURE:  04/08/2011 DATE OF DISCHARGE:                              OPERATIVE REPORT   PREOPERATIVE DIAGNOSIS:  Locally advanced right breast cancer.  POSTOPERATIVE DIAGNOSIS:  Locally advanced right breast cancer.  PROCEDURE:  Placement of left subclavian vein Port-A-Cath.  SURGEON:  [name]. [name], MD  ANESTHESIA:  General endotracheal.  PROCEDURE:  After informed consent was obtained, the patient was brought to the operating room, placed in supine position on the operating table. After adequate induction of general anesthesia, roll was placed between the patient's shoulder blades to extend the shoulder slightly.  Her left chest and neck area were then prepped with ChloraPrep, allowed to dry and draped in usual sterile manner.  The patient was placed in Trendelenburg position just lateral to the bend of the clavicle.  The chest wall was infiltrated with 0.25% Marcaine.  A small incision was made with #15 blade knife.  A large bore finder needle was then placed through the incision.  It was angled.  It was directed beneath the bend of the clavicle towards the sternal notch and in doing this, we were able to identify the left subclavian vein without difficulty.  A wire was placed through the needle using the Seldinger technique without difficulty.  The wire was confirmed in the central venous system using real time fluoroscopy.  At this point, the incision on the left chest wall was extended medially and laterally, a short distance with a 15 blade knife.  A subcutaneous pocket was then created inferior to the incision using a combination of blunt finger dissection and some sharp dissection with the electrocautery.  The tubing was placed on the reservoir.   The reservoir was placed in the pocket and the length of the tubing was estimated again using real time fluoroscopy.  At this point, attempt was made to place the sheath and dilator over the wire using the Seldinger technique.  At some point, it appeared as though the wire had been pulled back and came out of the vein.  A new kit was obtained.  A new needle was used to go behind the bend of the clavicle aiming towards the sternal notch and again without difficulty.  We were able to find the left subclavian vein.  The wire was fed through the needle again without difficulty using the Seldinger technique.  The sheath and dilator were then placed over the wire again using the Seldinger technique without difficulty and then the dilator and wire were removed. The thumb was placed over the opening of the sheath.  Tubing was then fed through the sheath as far as it could be fed.  The sheath was gently cracked and separated keeping the tubing in place.  Once this was accomplished, another fluoro image showed that the catheter unfortunately was curled up in the right subclavian vein as we pulled the catheter back.  We were unable to  recede it under the clavicle.  We had disconnected its attachment from the reservoir and placed a wire back down the tubing so that it was stiff enough to refeed and in doing this we were able to under direct fluoro vision feed it into the superior vena cava.  The wire was then removed.  Some of the tubing was trimmed back externally, so it was at the proper length.  The tubing was then replaced on the reservoir and the anchor was also placed over the tubing onto the reservoir.  The reservoir was then placed in pocket and anchored in the pocket with two 2-0 Prolene stitches.  The reservoir was then aspirated and it appeared to aspirate blood fairly easily.  It was then flushed initially with a dilute heparin solution then with a more concentrated heparin solution.  The  subcutaneous tissue was then closed over the port with interrupted 3-0 Vicryl stitches and the skin was closed with a running 4-0 Monocryl subcuticular stitch.  Dermabond dressing was applied.  The patient tolerated the procedure well.  At the end of the case, all needle, sponge, and instrument counts were correct. The patient was then awakened and taken to recovery in stable condition.     [name]. [name], M.D.     PST/MEDQ  D:  04/08/2011  T:  04/08/2011  Job:  [id number]  Electronically Signed by Chevis Pretty III M.D. on 04/19/2011 07:55:52 AM

## 2011-04-21 ENCOUNTER — Encounter: Payer: Medicaid Other | Admitting: Oncology

## 2011-04-28 ENCOUNTER — Encounter (HOSPITAL_BASED_OUTPATIENT_CLINIC_OR_DEPARTMENT_OTHER): Payer: Medicaid Other | Admitting: Oncology

## 2011-04-28 ENCOUNTER — Other Ambulatory Visit: Payer: Self-pay | Admitting: Oncology

## 2011-04-28 DIAGNOSIS — Z170 Estrogen receptor positive status [ER+]: Secondary | ICD-10-CM

## 2011-04-28 DIAGNOSIS — Z5111 Encounter for antineoplastic chemotherapy: Secondary | ICD-10-CM

## 2011-04-28 DIAGNOSIS — C50419 Malignant neoplasm of upper-outer quadrant of unspecified female breast: Secondary | ICD-10-CM

## 2011-04-28 LAB — CBC WITH DIFFERENTIAL/PLATELET
Basophils Absolute: 0 10*3/uL (ref 0.0–0.1)
Eosinophils Absolute: 0.2 10*3/uL (ref 0.0–0.5)
HGB: 10.3 g/dL — ABNORMAL LOW (ref 11.6–15.9)
MCV: 75.1 fL — ABNORMAL LOW (ref 79.5–101.0)
MONO#: 0.7 10*3/uL (ref 0.1–0.9)
NEUT#: 3.7 10*3/uL (ref 1.5–6.5)
RDW: 14 % (ref 11.2–14.5)
lymph#: 1.5 10*3/uL (ref 0.9–3.3)

## 2011-04-28 LAB — COMPREHENSIVE METABOLIC PANEL
Albumin: 3.9 g/dL (ref 3.5–5.2)
CO2: 23 mEq/L (ref 19–32)
Glucose, Bld: 72 mg/dL (ref 70–99)
Potassium: 4.2 mEq/L (ref 3.5–5.3)
Sodium: 136 mEq/L (ref 135–145)
Total Protein: 7.1 g/dL (ref 6.0–8.3)

## 2011-04-29 ENCOUNTER — Encounter (HOSPITAL_BASED_OUTPATIENT_CLINIC_OR_DEPARTMENT_OTHER): Payer: Medicaid Other | Admitting: Oncology

## 2011-04-29 DIAGNOSIS — C50419 Malignant neoplasm of upper-outer quadrant of unspecified female breast: Secondary | ICD-10-CM

## 2011-04-29 DIAGNOSIS — Z5189 Encounter for other specified aftercare: Secondary | ICD-10-CM

## 2011-05-05 ENCOUNTER — Encounter (HOSPITAL_BASED_OUTPATIENT_CLINIC_OR_DEPARTMENT_OTHER): Payer: Medicaid Other | Admitting: Oncology

## 2011-05-05 ENCOUNTER — Other Ambulatory Visit: Payer: Self-pay | Admitting: Oncology

## 2011-05-05 DIAGNOSIS — I10 Essential (primary) hypertension: Secondary | ICD-10-CM

## 2011-05-05 DIAGNOSIS — C50419 Malignant neoplasm of upper-outer quadrant of unspecified female breast: Secondary | ICD-10-CM

## 2011-05-05 DIAGNOSIS — M545 Low back pain: Secondary | ICD-10-CM

## 2011-05-05 LAB — CBC WITH DIFFERENTIAL/PLATELET
BASO%: 0.2 % (ref 0.0–2.0)
Eosinophils Absolute: 0.3 10*3/uL (ref 0.0–0.5)
MCHC: 31.4 g/dL — ABNORMAL LOW (ref 31.5–36.0)
MCV: 76.8 fL — ABNORMAL LOW (ref 79.5–101.0)
MONO#: 0.1 10*3/uL (ref 0.1–0.9)
MONO%: 1.3 % (ref 0.0–14.0)
NEUT#: 5.3 10*3/uL (ref 1.5–6.5)
RBC: 4.26 10*6/uL (ref 3.70–5.45)
RDW: 14 % (ref 11.2–14.5)
WBC: 6.8 10*3/uL (ref 3.9–10.3)

## 2011-05-11 ENCOUNTER — Other Ambulatory Visit: Payer: Self-pay | Admitting: Oncology

## 2011-05-11 ENCOUNTER — Encounter (HOSPITAL_BASED_OUTPATIENT_CLINIC_OR_DEPARTMENT_OTHER): Payer: Medicaid Other | Admitting: Oncology

## 2011-05-11 DIAGNOSIS — M545 Low back pain: Secondary | ICD-10-CM

## 2011-05-11 DIAGNOSIS — Z170 Estrogen receptor positive status [ER+]: Secondary | ICD-10-CM

## 2011-05-11 DIAGNOSIS — R51 Headache: Secondary | ICD-10-CM

## 2011-05-11 DIAGNOSIS — C50419 Malignant neoplasm of upper-outer quadrant of unspecified female breast: Secondary | ICD-10-CM

## 2011-05-11 LAB — CBC WITH DIFFERENTIAL/PLATELET
Eosinophils Absolute: 0.2 10*3/uL (ref 0.0–0.5)
LYMPH%: 14.2 % (ref 14.0–49.7)
MONO#: 0.6 10*3/uL (ref 0.1–0.9)
NEUT#: 7.2 10*3/uL — ABNORMAL HIGH (ref 1.5–6.5)
Platelets: 222 10*3/uL (ref 145–400)
RBC: 4.19 10*6/uL (ref 3.70–5.45)
WBC: 9.4 10*3/uL (ref 3.9–10.3)
lymph#: 1.3 10*3/uL (ref 0.9–3.3)
nRBC: 0 % (ref 0–0)

## 2011-05-11 LAB — COMPREHENSIVE METABOLIC PANEL
ALT: 9 U/L (ref 0–35)
AST: 14 U/L (ref 0–37)
Albumin: 3.7 g/dL (ref 3.5–5.2)
CO2: 28 mEq/L (ref 19–32)
Calcium: 9.1 mg/dL (ref 8.4–10.5)
Chloride: 105 mEq/L (ref 96–112)
Creatinine, Ser: 0.82 mg/dL (ref 0.50–1.10)
Potassium: 4.1 mEq/L (ref 3.5–5.3)
Sodium: 142 mEq/L (ref 135–145)
Total Protein: 6.7 g/dL (ref 6.0–8.3)

## 2011-05-12 ENCOUNTER — Encounter (HOSPITAL_BASED_OUTPATIENT_CLINIC_OR_DEPARTMENT_OTHER): Payer: Medicaid Other | Admitting: Oncology

## 2011-05-12 DIAGNOSIS — Z5111 Encounter for antineoplastic chemotherapy: Secondary | ICD-10-CM

## 2011-05-12 DIAGNOSIS — C50419 Malignant neoplasm of upper-outer quadrant of unspecified female breast: Secondary | ICD-10-CM

## 2011-05-13 ENCOUNTER — Encounter (HOSPITAL_BASED_OUTPATIENT_CLINIC_OR_DEPARTMENT_OTHER): Payer: Medicaid Other | Admitting: Oncology

## 2011-05-13 DIAGNOSIS — C50419 Malignant neoplasm of upper-outer quadrant of unspecified female breast: Secondary | ICD-10-CM

## 2011-05-13 DIAGNOSIS — Z170 Estrogen receptor positive status [ER+]: Secondary | ICD-10-CM

## 2011-05-18 ENCOUNTER — Other Ambulatory Visit: Payer: Self-pay | Admitting: Oncology

## 2011-05-18 ENCOUNTER — Encounter (HOSPITAL_BASED_OUTPATIENT_CLINIC_OR_DEPARTMENT_OTHER): Payer: Medicaid Other | Admitting: Oncology

## 2011-05-18 DIAGNOSIS — C50419 Malignant neoplasm of upper-outer quadrant of unspecified female breast: Secondary | ICD-10-CM

## 2011-05-18 DIAGNOSIS — B370 Candidal stomatitis: Secondary | ICD-10-CM

## 2011-05-18 DIAGNOSIS — Z170 Estrogen receptor positive status [ER+]: Secondary | ICD-10-CM

## 2011-05-18 LAB — CBC WITH DIFFERENTIAL/PLATELET
BASO%: 0.3 % (ref 0.0–2.0)
EOS%: 4.1 % (ref 0.0–7.0)
MCH: 24.3 pg — ABNORMAL LOW (ref 25.1–34.0)
MCHC: 32.1 g/dL (ref 31.5–36.0)
MONO#: 0.1 10*3/uL (ref 0.1–0.9)
RBC: 4.17 10*6/uL (ref 3.70–5.45)
WBC: 5.2 10*3/uL (ref 3.9–10.3)
lymph#: 0.9 10*3/uL (ref 0.9–3.3)

## 2011-05-25 ENCOUNTER — Other Ambulatory Visit: Payer: Self-pay | Admitting: Oncology

## 2011-05-25 ENCOUNTER — Encounter (HOSPITAL_BASED_OUTPATIENT_CLINIC_OR_DEPARTMENT_OTHER): Payer: Medicaid Other | Admitting: Oncology

## 2011-05-25 DIAGNOSIS — Z170 Estrogen receptor positive status [ER+]: Secondary | ICD-10-CM

## 2011-05-25 DIAGNOSIS — T451X5A Adverse effect of antineoplastic and immunosuppressive drugs, initial encounter: Secondary | ICD-10-CM

## 2011-05-25 DIAGNOSIS — C50419 Malignant neoplasm of upper-outer quadrant of unspecified female breast: Secondary | ICD-10-CM

## 2011-05-25 LAB — CBC WITH DIFFERENTIAL/PLATELET
BASO%: 0.2 % (ref 0.0–2.0)
Basophils Absolute: 0 10*3/uL (ref 0.0–0.1)
EOS%: 0.8 % (ref 0.0–7.0)
HCT: 30.1 % — ABNORMAL LOW (ref 34.8–46.6)
HGB: 9.6 g/dL — ABNORMAL LOW (ref 11.6–15.9)
MCH: 24.4 pg — ABNORMAL LOW (ref 25.1–34.0)
MCHC: 32.1 g/dL (ref 31.5–36.0)
MONO#: 0.8 10*3/uL (ref 0.1–0.9)
NEUT%: 79.1 % — ABNORMAL HIGH (ref 38.4–76.8)
RDW: 14.4 % (ref 11.2–14.5)
WBC: 9.4 10*3/uL (ref 3.9–10.3)
lymph#: 1.1 10*3/uL (ref 0.9–3.3)

## 2011-05-25 LAB — COMPREHENSIVE METABOLIC PANEL
Alkaline Phosphatase: 107 U/L (ref 39–117)
BUN: 10 mg/dL (ref 6–23)
Glucose, Bld: 110 mg/dL — ABNORMAL HIGH (ref 70–99)
Sodium: 142 mEq/L (ref 135–145)
Total Bilirubin: 0.2 mg/dL — ABNORMAL LOW (ref 0.3–1.2)
Total Protein: 6.5 g/dL (ref 6.0–8.3)

## 2011-05-26 ENCOUNTER — Encounter (HOSPITAL_BASED_OUTPATIENT_CLINIC_OR_DEPARTMENT_OTHER): Payer: Medicaid Other | Admitting: Oncology

## 2011-05-26 DIAGNOSIS — C50419 Malignant neoplasm of upper-outer quadrant of unspecified female breast: Secondary | ICD-10-CM

## 2011-05-26 DIAGNOSIS — Z5111 Encounter for antineoplastic chemotherapy: Secondary | ICD-10-CM

## 2011-05-27 ENCOUNTER — Encounter (HOSPITAL_BASED_OUTPATIENT_CLINIC_OR_DEPARTMENT_OTHER): Payer: Medicaid Other | Admitting: Oncology

## 2011-05-27 ENCOUNTER — Other Ambulatory Visit: Payer: Self-pay | Admitting: Oncology

## 2011-05-27 DIAGNOSIS — Z5189 Encounter for other specified aftercare: Secondary | ICD-10-CM

## 2011-05-27 DIAGNOSIS — R42 Dizziness and giddiness: Secondary | ICD-10-CM

## 2011-05-27 DIAGNOSIS — C50911 Malignant neoplasm of unspecified site of right female breast: Secondary | ICD-10-CM

## 2011-05-27 DIAGNOSIS — R110 Nausea: Secondary | ICD-10-CM

## 2011-05-27 DIAGNOSIS — C50419 Malignant neoplasm of upper-outer quadrant of unspecified female breast: Secondary | ICD-10-CM

## 2011-06-02 ENCOUNTER — Other Ambulatory Visit: Payer: Self-pay | Admitting: Oncology

## 2011-06-02 ENCOUNTER — Encounter (HOSPITAL_BASED_OUTPATIENT_CLINIC_OR_DEPARTMENT_OTHER): Payer: Medicaid Other | Admitting: Oncology

## 2011-06-02 DIAGNOSIS — D6481 Anemia due to antineoplastic chemotherapy: Secondary | ICD-10-CM

## 2011-06-02 DIAGNOSIS — Z79899 Other long term (current) drug therapy: Secondary | ICD-10-CM

## 2011-06-02 DIAGNOSIS — C50919 Malignant neoplasm of unspecified site of unspecified female breast: Secondary | ICD-10-CM

## 2011-06-02 DIAGNOSIS — C50419 Malignant neoplasm of upper-outer quadrant of unspecified female breast: Secondary | ICD-10-CM

## 2011-06-02 DIAGNOSIS — Z170 Estrogen receptor positive status [ER+]: Secondary | ICD-10-CM

## 2011-06-02 DIAGNOSIS — Z5189 Encounter for other specified aftercare: Secondary | ICD-10-CM

## 2011-06-02 DIAGNOSIS — Z5111 Encounter for antineoplastic chemotherapy: Secondary | ICD-10-CM

## 2011-06-02 DIAGNOSIS — T451X5A Adverse effect of antineoplastic and immunosuppressive drugs, initial encounter: Secondary | ICD-10-CM

## 2011-06-02 LAB — URINALYSIS, MICROSCOPIC - CHCC
Leukocyte Esterase: NEGATIVE
Nitrite: NEGATIVE
pH: 6.5 (ref 4.6–8.0)

## 2011-06-02 LAB — CBC WITH DIFFERENTIAL/PLATELET
Basophils Absolute: 0 10*3/uL (ref 0.0–0.1)
Eosinophils Absolute: 0.2 10*3/uL (ref 0.0–0.5)
HCT: 27.5 % — ABNORMAL LOW (ref 34.8–46.6)
HGB: 8.9 g/dL — ABNORMAL LOW (ref 11.6–15.9)
LYMPH%: 13 % — ABNORMAL LOW (ref 14.0–49.7)
MCV: 75.3 fL — ABNORMAL LOW (ref 79.5–101.0)
MONO%: 4.2 % (ref 0.0–14.0)
NEUT#: 4.2 10*3/uL (ref 1.5–6.5)
NEUT%: 78.5 % — ABNORMAL HIGH (ref 38.4–76.8)
Platelets: 170 10*3/uL (ref 145–400)

## 2011-06-04 LAB — URINE CULTURE

## 2011-06-09 ENCOUNTER — Encounter (HOSPITAL_BASED_OUTPATIENT_CLINIC_OR_DEPARTMENT_OTHER): Payer: Medicaid Other | Admitting: Oncology

## 2011-06-09 ENCOUNTER — Other Ambulatory Visit: Payer: Self-pay | Admitting: Oncology

## 2011-06-09 DIAGNOSIS — C50419 Malignant neoplasm of upper-outer quadrant of unspecified female breast: Secondary | ICD-10-CM

## 2011-06-09 DIAGNOSIS — Z170 Estrogen receptor positive status [ER+]: Secondary | ICD-10-CM

## 2011-06-09 DIAGNOSIS — Z5111 Encounter for antineoplastic chemotherapy: Secondary | ICD-10-CM

## 2011-06-09 LAB — CBC WITH DIFFERENTIAL/PLATELET
Eosinophils Absolute: 0.1 10*3/uL (ref 0.0–0.5)
HCT: 29.4 % — ABNORMAL LOW (ref 34.8–46.6)
LYMPH%: 12.5 % — ABNORMAL LOW (ref 14.0–49.7)
MONO#: 1.2 10*3/uL — ABNORMAL HIGH (ref 0.1–0.9)
NEUT#: 7.4 10*3/uL — ABNORMAL HIGH (ref 1.5–6.5)
Platelets: 278 10*3/uL (ref 145–400)
RBC: 3.99 10*6/uL (ref 3.70–5.45)
WBC: 10 10*3/uL (ref 3.9–10.3)
lymph#: 1.3 10*3/uL (ref 0.9–3.3)
nRBC: 1 % — ABNORMAL HIGH (ref 0–0)

## 2011-06-10 ENCOUNTER — Encounter (HOSPITAL_BASED_OUTPATIENT_CLINIC_OR_DEPARTMENT_OTHER): Payer: Medicaid Other | Admitting: Oncology

## 2011-06-10 DIAGNOSIS — C50419 Malignant neoplasm of upper-outer quadrant of unspecified female breast: Secondary | ICD-10-CM

## 2011-06-10 DIAGNOSIS — Z5189 Encounter for other specified aftercare: Secondary | ICD-10-CM

## 2011-06-10 LAB — COMPREHENSIVE METABOLIC PANEL
ALT: 13 U/L (ref 0–35)
AST: 15 U/L (ref 0–37)
Albumin: 3.6 g/dL (ref 3.5–5.2)
CO2: 28 mEq/L (ref 19–32)
Calcium: 9.5 mg/dL (ref 8.4–10.5)
Chloride: 101 mEq/L (ref 96–112)
Creatinine, Ser: 0.69 mg/dL (ref 0.50–1.10)
Potassium: 3.6 mEq/L (ref 3.5–5.3)
Total Protein: 6.8 g/dL (ref 6.0–8.3)

## 2011-06-11 ENCOUNTER — Other Ambulatory Visit: Payer: Medicaid Other

## 2011-06-14 ENCOUNTER — Ambulatory Visit
Admission: RE | Admit: 2011-06-14 | Discharge: 2011-06-14 | Disposition: A | Discharge status: Home / Self Care | Payer: Medicaid Other | Source: Ambulatory Visit | Attending: Oncology | Admitting: Oncology

## 2011-06-14 DIAGNOSIS — C50911 Malignant neoplasm of unspecified site of right female breast: Secondary | ICD-10-CM

## 2011-06-14 MED ORDER — GADOBENATE DIMEGLUMINE 529 MG/ML IV SOLN
20.0000 mL | Freq: Once | INTRAVENOUS | Status: AC | PRN
  Administered 2011-06-14: 20 mL via INTRAVENOUS

## 2011-06-23 ENCOUNTER — Other Ambulatory Visit: Payer: Self-pay | Admitting: Oncology

## 2011-06-23 ENCOUNTER — Encounter (HOSPITAL_BASED_OUTPATIENT_CLINIC_OR_DEPARTMENT_OTHER): Payer: Medicaid Other | Admitting: Oncology

## 2011-06-23 DIAGNOSIS — Z5111 Encounter for antineoplastic chemotherapy: Secondary | ICD-10-CM

## 2011-06-23 DIAGNOSIS — C50419 Malignant neoplasm of upper-outer quadrant of unspecified female breast: Secondary | ICD-10-CM

## 2011-06-23 DIAGNOSIS — Z170 Estrogen receptor positive status [ER+]: Secondary | ICD-10-CM

## 2011-06-23 DIAGNOSIS — Z452 Encounter for adjustment and management of vascular access device: Secondary | ICD-10-CM

## 2011-06-23 DIAGNOSIS — T451X5A Adverse effect of antineoplastic and immunosuppressive drugs, initial encounter: Secondary | ICD-10-CM

## 2011-06-23 DIAGNOSIS — D6481 Anemia due to antineoplastic chemotherapy: Secondary | ICD-10-CM

## 2011-06-23 LAB — CBC WITH DIFFERENTIAL/PLATELET
BASO%: 1.1 % (ref 0.0–2.0)
EOS%: 1.8 % (ref 0.0–7.0)
HCT: 29.1 % — ABNORMAL LOW (ref 34.8–46.6)
MCH: 23.4 pg — ABNORMAL LOW (ref 25.1–34.0)
MCHC: 31.3 g/dL — ABNORMAL LOW (ref 31.5–36.0)
MONO#: 0.7 10*3/uL (ref 0.1–0.9)
RBC: 3.89 10*6/uL (ref 3.70–5.45)
RDW: 19.8 % — ABNORMAL HIGH (ref 11.2–14.5)
WBC: 6.2 10*3/uL (ref 3.9–10.3)
lymph#: 0.8 10*3/uL — ABNORMAL LOW (ref 0.9–3.3)
nRBC: 2 % — ABNORMAL HIGH (ref 0–0)

## 2011-06-23 LAB — COMPREHENSIVE METABOLIC PANEL
ALT: 12 U/L (ref 0–35)
AST: 20 U/L (ref 0–37)
Albumin: 3.4 g/dL — ABNORMAL LOW (ref 3.5–5.2)
Alkaline Phosphatase: 108 U/L (ref 39–117)
BUN: 14 mg/dL (ref 6–23)
Calcium: 9.6 mg/dL (ref 8.4–10.5)
Chloride: 101 mEq/L (ref 96–112)
Potassium: 3.6 mEq/L (ref 3.5–5.3)
Sodium: 138 mEq/L (ref 135–145)

## 2011-06-29 ENCOUNTER — Encounter: Payer: Self-pay | Admitting: Physician Assistant

## 2011-06-29 ENCOUNTER — Ambulatory Visit (HOSPITAL_BASED_OUTPATIENT_CLINIC_OR_DEPARTMENT_OTHER): Payer: Medicaid Other | Admitting: Physician Assistant

## 2011-06-29 ENCOUNTER — Ambulatory Visit (HOSPITAL_BASED_OUTPATIENT_CLINIC_OR_DEPARTMENT_OTHER): Payer: Medicaid Other | Admitting: Lab

## 2011-06-29 ENCOUNTER — Other Ambulatory Visit: Payer: Self-pay | Admitting: Oncology

## 2011-06-29 VITALS — BP 138/89 | HR 94 | Temp 98.9°F

## 2011-06-29 VITALS — BP 114/77 | HR 109 | Temp 98.4°F | Ht 66.5 in | Wt 310.0 lb

## 2011-06-29 DIAGNOSIS — C50919 Malignant neoplasm of unspecified site of unspecified female breast: Secondary | ICD-10-CM

## 2011-06-29 DIAGNOSIS — Z5189 Encounter for other specified aftercare: Secondary | ICD-10-CM

## 2011-06-29 DIAGNOSIS — Z170 Estrogen receptor positive status [ER+]: Secondary | ICD-10-CM

## 2011-06-29 DIAGNOSIS — R5381 Other malaise: Secondary | ICD-10-CM

## 2011-06-29 DIAGNOSIS — C50419 Malignant neoplasm of upper-outer quadrant of unspecified female breast: Secondary | ICD-10-CM

## 2011-06-29 DIAGNOSIS — D509 Iron deficiency anemia, unspecified: Secondary | ICD-10-CM

## 2011-06-29 DIAGNOSIS — Z5111 Encounter for antineoplastic chemotherapy: Secondary | ICD-10-CM

## 2011-06-29 LAB — CBC WITH DIFFERENTIAL/PLATELET
BASO%: 0.8 % (ref 0.0–2.0)
Basophils Absolute: 0 10*3/uL (ref 0.0–0.1)
EOS%: 2.3 % (ref 0.0–7.0)
HGB: 8.9 g/dL — ABNORMAL LOW (ref 11.6–15.9)
MCH: 24.5 pg — ABNORMAL LOW (ref 25.1–34.0)
MCHC: 32.3 g/dL (ref 31.5–36.0)
MCV: 76 fL — ABNORMAL LOW (ref 79.5–101.0)
MONO%: 6.2 % (ref 0.0–14.0)
NEUT%: 76.5 % (ref 38.4–76.8)
RDW: 22.8 % — ABNORMAL HIGH (ref 11.2–14.5)
lymph#: 0.8 10*3/uL — ABNORMAL LOW (ref 0.9–3.3)

## 2011-06-29 MED ORDER — SODIUM CHLORIDE 0.9 % IV SOLN
200.0000 mg | Freq: Once | INTRAVENOUS | Status: DC
  Filled 2011-06-29: qty 10

## 2011-06-29 MED ORDER — SODIUM CHLORIDE 0.9 % IV SOLN: 200.0000 mg | Freq: Once | INTRAVENOUS | Status: DC

## 2011-06-29 NOTE — Progress Notes (Signed)
Hematology and Oncology Follow Up Visit  [name] [id number] [birth year] 35 y.o. 06/29/2011 4:49 PM   Interim History:  Patient returns today for followup of her right breast carcinoma, currently receiving neoadjuvant chemotherapy. She is due for her second of 12 planned weekly doses of paclitaxel tomorrow, November 8. Overall the patient tolerated her first dose of paclitaxel last week well. She has had some side effects and complaints since that time, however. In fact over half of our 45 minute appointment today was spent going over her multiple concerns. She has developed a slight "cold ". She has a runny nose, but no fevers chills night sweats or coughs. Over the weekend, 2-3 days following her first dose of paclitaxel, she began to notice increased weakness and dizziness. By Monday she began to feel a bit better but continues to feel tired. With activity, she still becomes a little dizzy, and has shortness of breath with even minor exertion. She denies any signs of abnormal bleeding, but does note that she had her last menstrual cycle 2 weeks ago. It lasted for 4 days and was normal for her, not especially heavy. She had a slight headache on Sunday for which she took one dose of Aleve, and the headache resolved. She's had no change in vision other than the usual blurred vision she notes with the dexamethasone.  Otherwise she has had some occasional abdominal cramps, loose stools, but no one 3 stools, and some intermittent nausea but no emesis. She notes an occasional shooting pain in the left breast, but denies any erythema and has had no pain or tenderness around the port. She continues to have chronic back pain which is not new, but denies any additional pain today. No skin changes, no bed changes, or note that sensitivity, other than some mild hyperpigmentation. A detailed review of systems is otherwise noncontributory as noted below.   Review of Systems: Constitutional:  no weight loss, fever,  night sweats, fatigued and generally weak Eyes: blurry vision NWG:NFAOZ congestion, nasal discharge Cardiovascular: positive for - dyspnea on exertion and shortness of breath Respiratory: positive for - shortness of breath Neurological: negative Dermatological: negative Gastrointestinal: positive for - abdominal cramping, loose stools, and nausea Genito-Urinary: no dysuria, trouble voiding, or hematuria Hematological and Lymphatic: negative Breast: negative Musculoskeletal: negative Remaining ROS negative.  Medications: I have reviewed the patient's current medications.  Allergies: No Known Allergies   Physical Exam:  Blood pressure 114/77, pulse 109, temperature 98.4 F (36.9 C), temperature source Oral, height 5' 6.5" (1.689 m), weight 310 lb (140.615 kg), last menstrual period 10/24/2010. HEENT:  Sclerae anicteric, conjunctivae pale.  Oropharynx clear.  No mucositis or candidiasis.  Nodes:  No cervical, supraclavicular, or axillary lymphadenopathy palpated.  Breast Exam:  Deferred.  Lungs:  Clear to auscultation bilaterally.  No crackles, rhonchi, or wheezes.  Heart:  Regular rate and rhythm.  Abdomen: Obese, soft, nontender.  Positive bowel sounds.  No organomegaly or masses palpated.  Musculoskeletal:  No focal spinal tenderness to palpation.  Extremities:  Benign.  No peripheral edema or cyanosis.  Skin:  Benign with the exception of hyperpigmentation on the upper extremities.  Neuro:  Nonfocal.   Lab Results: Lab Results  Component Value Date   WBC 5.8 02/28/2011   HGB 8.9* 06/29/2011   HCT 27.7* 06/29/2011   MCV 76.0* 06/29/2011   PLT 271 06/29/2011   NEUTROABS 3.8 02/27/2011     Chemistry      Component Value Date/Time   NA 138 06/23/2011  1034   K 3.6 06/23/2011 1034   CL 101 06/23/2011 1034   CO2 28 06/23/2011 1034   BUN 14 06/23/2011 1034   CREATININE 0.59 06/23/2011 1034      Component Value Date/Time   CALCIUM 9.6 06/23/2011 1034   ALKPHOS 108 06/23/2011 1034   AST 20  06/23/2011 1034   ALT 12 06/23/2011 1034   BILITOT 0.2* 06/23/2011 1034           Impression and Plan: 35 year old [place] woman, status post right breast biopsy in July 2012 for a clinical T3 N1 invasive ductal carcinoma. Tumor was strongly ER positive, PR positive, HER-2/neu negative, with a very elevated MIB-1 of 98%. Grade 3. Currently receiving neoadjuvant chemotherapy, status post 4 dose dense cycles of doxorubicin/cyclophosphamide with Neulasta on day 2 for granulocyte support. Now receiving weekly paclitaxel, due for her second of 12 planned weekly doses tomorrow, November 8. The plan is to complete 4 doses of doxorubicin and cyclophosphamide, followed by 12 weekly doses of paclitaxel in the neoadjuvant setting prior to definitive surgery. Patient also has a history of iron deficiency anemia, now symptomatic.  This case was reviewed with Dr. Darnelle Catalan. Patient will return tomorrow as scheduled for her second weekly dose of paclitaxel. At that time we will also repeat a metabolic panel. We'll also obtain a type and cross, and we'll plan on transfusing one unit of packed red blood cells on Friday, November 9. She does need some IV iron. She admits that she has not been taking her oral iron regularly as it has been upsetting her stomach during chemotherapy. Accordingly Dr. Darnelle Catalan would like to give her 2 doses of Jennifer. She received her first dose next week when she returns for paclitaxel on November 15. She will have the following week off to celebrate Thanksgiving and she plans to be out of town. She will then return on November 29 for labs, followup, and paclitaxel in addition to her second dose of IV iron.  All of this was reviewed with the patient today and she voices understanding and agreement with this plan.  Spent more than half the time coordinating care.    Caron Tardif, PA-C 11/7/20124:49 PM

## 2011-06-30 ENCOUNTER — Other Ambulatory Visit: Payer: Self-pay | Admitting: Physician Assistant

## 2011-06-30 ENCOUNTER — Encounter (HOSPITAL_COMMUNITY)
Admission: RE | Admit: 2011-06-30 | Discharge: 2011-06-30 | Disposition: A | Discharge status: Home / Self Care | Payer: Medicaid Other | Source: Ambulatory Visit | Attending: Oncology | Admitting: Oncology

## 2011-06-30 ENCOUNTER — Telehealth: Payer: Self-pay | Admitting: *Deleted

## 2011-06-30 ENCOUNTER — Other Ambulatory Visit (HOSPITAL_BASED_OUTPATIENT_CLINIC_OR_DEPARTMENT_OTHER): Payer: Medicaid Other | Admitting: Lab

## 2011-06-30 ENCOUNTER — Other Ambulatory Visit: Payer: Self-pay | Admitting: Oncology

## 2011-06-30 ENCOUNTER — Ambulatory Visit (HOSPITAL_BASED_OUTPATIENT_CLINIC_OR_DEPARTMENT_OTHER): Payer: Medicaid Other

## 2011-06-30 DIAGNOSIS — Z5111 Encounter for antineoplastic chemotherapy: Secondary | ICD-10-CM

## 2011-06-30 DIAGNOSIS — C50919 Malignant neoplasm of unspecified site of unspecified female breast: Secondary | ICD-10-CM

## 2011-06-30 DIAGNOSIS — C50419 Malignant neoplasm of upper-outer quadrant of unspecified female breast: Secondary | ICD-10-CM

## 2011-06-30 DIAGNOSIS — D509 Iron deficiency anemia, unspecified: Secondary | ICD-10-CM

## 2011-06-30 DIAGNOSIS — Z170 Estrogen receptor positive status [ER+]: Secondary | ICD-10-CM

## 2011-06-30 DIAGNOSIS — D649 Anemia, unspecified: Secondary | ICD-10-CM | POA: Insufficient documentation

## 2011-06-30 LAB — ABO/RH: ABO/RH(D): O POS

## 2011-06-30 LAB — COMPREHENSIVE METABOLIC PANEL
ALT: 18 U/L (ref 0–35)
AST: 22 U/L (ref 0–37)
Chloride: 100 mEq/L (ref 96–112)
Creatinine, Ser: 0.72 mg/dL (ref 0.50–1.10)
Total Bilirubin: 0.2 mg/dL — ABNORMAL LOW (ref 0.3–1.2)

## 2011-06-30 LAB — PREPARE RBC (CROSSMATCH)

## 2011-06-30 MED ORDER — DEXAMETHASONE SODIUM PHOSPHATE 4 MG/ML IJ SOLN
10.0000 mg | Freq: Once | INTRAMUSCULAR | Status: AC
  Administered 2011-06-30: 10 mg via INTRAVENOUS

## 2011-06-30 MED ORDER — PACLITAXEL CHEMO INJECTION 300 MG/50ML
80.0000 mg/m2 | Freq: Once | INTRAVENOUS | Status: AC
  Administered 2011-06-30: 204 mg via INTRAVENOUS
  Filled 2011-06-30: qty 34

## 2011-06-30 MED ORDER — SODIUM CHLORIDE 0.9 % IV SOLN
Freq: Once | INTRAVENOUS | Status: AC
  Administered 2011-06-30: 14:00:00 via INTRAVENOUS

## 2011-06-30 MED ORDER — HEPARIN SOD (PORK) LOCK FLUSH 100 UNIT/ML IV SOLN
500.0000 [IU] | Freq: Once | INTRAVENOUS | Status: AC | PRN
  Administered 2011-06-30: 500 [IU]
  Filled 2011-06-30: qty 5

## 2011-06-30 MED ORDER — FAMOTIDINE IN NACL 20-0.9 MG/50ML-% IV SOLN
20.0000 mg | Freq: Once | INTRAVENOUS | Status: AC
  Administered 2011-06-30: 20 mg via INTRAVENOUS

## 2011-06-30 MED ORDER — SODIUM CHLORIDE 0.9 % IJ SOLN
10.0000 mL | INTRAMUSCULAR | Status: DC | PRN
  Administered 2011-06-30: 10 mL
  Filled 2011-06-30: qty 10

## 2011-06-30 MED ORDER — DIPHENHYDRAMINE HCL 50 MG/ML IJ SOLN
25.0000 mg | Freq: Once | INTRAMUSCULAR | Status: AC
  Administered 2011-06-30: 25 mg via INTRAVENOUS

## 2011-06-30 MED ORDER — ONDANSETRON 8 MG/50ML IVPB (CHCC)
8.0000 mg | Freq: Once | INTRAVENOUS | Status: AC
  Administered 2011-06-30: 8 mg via INTRAVENOUS

## 2011-06-30 NOTE — Telephone Encounter (Signed)
Per AB, left pt a message reminding her of an appointment in the infusion room to rec. 1 unit of blood at 1015

## 2011-06-30 NOTE — Patient Instructions (Signed)
1635-Pt discharged ambulatory with next appointment confirmed.  Pt aware to call with any questions or concerns.  

## 2011-07-01 ENCOUNTER — Ambulatory Visit (HOSPITAL_BASED_OUTPATIENT_CLINIC_OR_DEPARTMENT_OTHER): Payer: Medicaid Other

## 2011-07-01 VITALS — BP 155/101 | HR 95 | Temp 98.8°F

## 2011-07-01 DIAGNOSIS — D509 Iron deficiency anemia, unspecified: Secondary | ICD-10-CM

## 2011-07-01 LAB — HOLD TUBE, BLOOD BANK

## 2011-07-01 MED ORDER — ACETAMINOPHEN 325 MG PO TABS
650.0000 mg | ORAL_TABLET | Freq: Once | ORAL | Status: AC
  Administered 2011-07-01: 650 mg via ORAL

## 2011-07-01 MED ORDER — DIPHENHYDRAMINE HCL 25 MG PO CAPS
25.0000 mg | ORAL_CAPSULE | Freq: Once | ORAL | Status: AC
  Administered 2011-07-01: 25 mg via ORAL

## 2011-07-01 MED ORDER — SODIUM CHLORIDE 0.9 % IV SOLN: Freq: Once | INTRAVENOUS | Status: DC

## 2011-07-01 MED ORDER — FUROSEMIDE 10 MG/ML IJ SOLN
20.0000 mg | Freq: Once | INTRAMUSCULAR | Status: AC
  Administered 2011-07-01: 20 mg via INTRAVENOUS

## 2011-07-02 LAB — TYPE AND SCREEN: Unit division: 0

## 2011-07-04 NOTE — Progress Notes (Signed)
Second unit blood completed at 4:45 pm, and portacath deaccessed at 4:50 pm.  Site was clean, dry and intact. Patient tolerated well.  Was unable to record progress note at that time.

## 2011-07-06 ENCOUNTER — Other Ambulatory Visit: Payer: Self-pay | Admitting: Oncology

## 2011-07-07 ENCOUNTER — Other Ambulatory Visit: Payer: Self-pay | Admitting: Oncology

## 2011-07-07 ENCOUNTER — Other Ambulatory Visit (HOSPITAL_BASED_OUTPATIENT_CLINIC_OR_DEPARTMENT_OTHER): Payer: Medicaid Other

## 2011-07-07 ENCOUNTER — Ambulatory Visit (HOSPITAL_BASED_OUTPATIENT_CLINIC_OR_DEPARTMENT_OTHER): Payer: Medicaid Other

## 2011-07-07 DIAGNOSIS — C50419 Malignant neoplasm of upper-outer quadrant of unspecified female breast: Secondary | ICD-10-CM

## 2011-07-07 DIAGNOSIS — C50919 Malignant neoplasm of unspecified site of unspecified female breast: Secondary | ICD-10-CM

## 2011-07-07 DIAGNOSIS — Z5111 Encounter for antineoplastic chemotherapy: Secondary | ICD-10-CM

## 2011-07-07 DIAGNOSIS — Z170 Estrogen receptor positive status [ER+]: Secondary | ICD-10-CM

## 2011-07-07 DIAGNOSIS — D509 Iron deficiency anemia, unspecified: Secondary | ICD-10-CM

## 2011-07-07 LAB — CBC WITH DIFFERENTIAL/PLATELET
Basophils Absolute: 0.1 10*3/uL (ref 0.0–0.1)
EOS%: 3.6 % (ref 0.0–7.0)
Eosinophils Absolute: 0.2 10*3/uL (ref 0.0–0.5)
HCT: 31.7 % — ABNORMAL LOW (ref 34.8–46.6)
HGB: 10.3 g/dL — ABNORMAL LOW (ref 11.6–15.9)
MONO#: 0.6 10*3/uL (ref 0.1–0.9)
NEUT#: 4.5 10*3/uL (ref 1.5–6.5)
NEUT%: 70.1 % (ref 38.4–76.8)
RDW: 21.6 % — ABNORMAL HIGH (ref 11.2–14.5)
WBC: 6.4 10*3/uL (ref 3.9–10.3)
lymph#: 1 10*3/uL (ref 0.9–3.3)

## 2011-07-07 MED ORDER — SODIUM CHLORIDE 0.9 % IJ SOLN
10.0000 mL | INTRAMUSCULAR | Status: DC | PRN
  Administered 2011-07-07: 10 mL
  Filled 2011-07-07: qty 10

## 2011-07-07 MED ORDER — FAMOTIDINE IN NACL 20-0.9 MG/50ML-% IV SOLN
20.0000 mg | Freq: Once | INTRAVENOUS | Status: AC
  Administered 2011-07-07: 20 mg via INTRAVENOUS

## 2011-07-07 MED ORDER — ONDANSETRON 8 MG/50ML IVPB (CHCC)
8.0000 mg | Freq: Once | INTRAVENOUS | Status: AC
  Administered 2011-07-07: 8 mg via INTRAVENOUS

## 2011-07-07 MED ORDER — PACLITAXEL CHEMO INJECTION 300 MG/50ML
80.0000 mg/m2 | Freq: Once | INTRAVENOUS | Status: AC
  Administered 2011-07-07: 204 mg via INTRAVENOUS
  Filled 2011-07-07: qty 34

## 2011-07-07 MED ORDER — DIPHENHYDRAMINE HCL 50 MG/ML IJ SOLN
25.0000 mg | Freq: Once | INTRAMUSCULAR | Status: AC
  Administered 2011-07-07: 25 mg via INTRAVENOUS

## 2011-07-07 MED ORDER — HEPARIN SOD (PORK) LOCK FLUSH 100 UNIT/ML IV SOLN
500.0000 [IU] | Freq: Once | INTRAVENOUS | Status: AC | PRN
  Administered 2011-07-07: 500 [IU]
  Filled 2011-07-07: qty 5

## 2011-07-07 MED ORDER — SODIUM CHLORIDE 0.9 % IV SOLN
Freq: Once | INTRAVENOUS | Status: AC
  Administered 2011-07-07: 14:00:00 via INTRAVENOUS

## 2011-07-07 MED ORDER — DEXAMETHASONE SODIUM PHOSPHATE 4 MG/ML IJ SOLN
4.0000 mg | Freq: Once | INTRAMUSCULAR | Status: AC
  Administered 2011-07-07: 4 mg via INTRAVENOUS

## 2011-07-08 ENCOUNTER — Encounter (INDEPENDENT_AMBULATORY_CARE_PROVIDER_SITE_OTHER): Payer: Self-pay | Admitting: General Surgery

## 2011-07-08 ENCOUNTER — Ambulatory Visit (INDEPENDENT_AMBULATORY_CARE_PROVIDER_SITE_OTHER): Payer: Medicaid Other | Admitting: General Surgery

## 2011-07-08 VITALS — BP 156/104 | HR 104 | Temp 97.4°F | Resp 20 | Ht 67.0 in | Wt 312.0 lb

## 2011-07-08 DIAGNOSIS — C50919 Malignant neoplasm of unspecified site of unspecified female breast: Secondary | ICD-10-CM

## 2011-07-08 NOTE — Progress Notes (Signed)
Subjective:     Patient ID: [name], female   DOB: [birth year], 35 y.o.   MRN: [id number]  HPI The patient is a 35 year old black female who we saw back in August with a large invasive ductal cancer in the upper outer right breast. She's been getting neoadjuvant chemotherapy and tolerating it well. Her most recent MRI showed the tumor to be just over 3 cm in size compared to 5.5 originally. She has 9 more treatments to go which she is getting on a weekly basis .  Review of Systems     Objective:   Physical Exam On exam she no longer has a palpable mass in the right breast. No palpable axillary supraclavicular or cervical lymphadenopathy.    Assessment:     She is tolerating neoadjuvant chemotherapy well for a large upper-outer right breast cancer    Plan:     We will plan to see her back in about 6 weeks at which time she will be ready to schedule surgery. I believe she will be a good candidate for breast conservation and she would also like to preserve her breast if at all possible. She will need a wire localized lumpectomy and some amount mapping. I discussed with her in detail the risks and benefits of the operation as well as some of the technical aspects and she understands and wishes to proceed

## 2011-07-11 ENCOUNTER — Ambulatory Visit: Payer: Medicaid Other

## 2011-07-11 ENCOUNTER — Other Ambulatory Visit: Payer: Medicaid Other | Admitting: Lab

## 2011-07-13 ENCOUNTER — Ambulatory Visit: Payer: Medicaid Other

## 2011-07-13 ENCOUNTER — Other Ambulatory Visit: Payer: Medicaid Other | Admitting: Lab

## 2011-07-21 ENCOUNTER — Telehealth: Payer: Self-pay | Admitting: Oncology

## 2011-07-21 ENCOUNTER — Ambulatory Visit: Payer: Medicaid Other

## 2011-07-21 ENCOUNTER — Other Ambulatory Visit: Payer: Medicaid Other | Admitting: Lab

## 2011-07-21 ENCOUNTER — Ambulatory Visit (HOSPITAL_BASED_OUTPATIENT_CLINIC_OR_DEPARTMENT_OTHER): Payer: Medicaid Other

## 2011-07-21 ENCOUNTER — Other Ambulatory Visit (HOSPITAL_BASED_OUTPATIENT_CLINIC_OR_DEPARTMENT_OTHER): Payer: Medicaid Other | Admitting: Lab

## 2011-07-21 ENCOUNTER — Ambulatory Visit: Payer: Medicaid Other | Admitting: Physician Assistant

## 2011-07-21 ENCOUNTER — Other Ambulatory Visit: Payer: Self-pay | Admitting: Oncology

## 2011-07-21 ENCOUNTER — Encounter: Payer: Self-pay | Admitting: Physician Assistant

## 2011-07-21 ENCOUNTER — Ambulatory Visit (HOSPITAL_BASED_OUTPATIENT_CLINIC_OR_DEPARTMENT_OTHER): Payer: Medicaid Other | Admitting: Physician Assistant

## 2011-07-21 VITALS — BP 158/121 | HR 91 | Temp 98.2°F | Ht 67.0 in | Wt 318.1 lb

## 2011-07-21 DIAGNOSIS — Z170 Estrogen receptor positive status [ER+]: Secondary | ICD-10-CM

## 2011-07-21 DIAGNOSIS — C50419 Malignant neoplasm of upper-outer quadrant of unspecified female breast: Secondary | ICD-10-CM

## 2011-07-21 DIAGNOSIS — I10 Essential (primary) hypertension: Secondary | ICD-10-CM

## 2011-07-21 DIAGNOSIS — C50919 Malignant neoplasm of unspecified site of unspecified female breast: Secondary | ICD-10-CM

## 2011-07-21 DIAGNOSIS — Z5111 Encounter for antineoplastic chemotherapy: Secondary | ICD-10-CM

## 2011-07-21 DIAGNOSIS — D509 Iron deficiency anemia, unspecified: Secondary | ICD-10-CM

## 2011-07-21 DIAGNOSIS — R5381 Other malaise: Secondary | ICD-10-CM

## 2011-07-21 LAB — COMPREHENSIVE METABOLIC PANEL
ALT: 24 U/L (ref 0–35)
BUN: 8 mg/dL (ref 6–23)
CO2: 30 mEq/L (ref 19–32)
Calcium: 9.7 mg/dL (ref 8.4–10.5)
Chloride: 100 mEq/L (ref 96–112)
Creatinine, Ser: 0.66 mg/dL (ref 0.50–1.10)
Glucose, Bld: 97 mg/dL (ref 70–99)

## 2011-07-21 LAB — CBC WITH DIFFERENTIAL/PLATELET
Basophils Absolute: 0 10*3/uL (ref 0.0–0.1)
EOS%: 2.3 % (ref 0.0–7.0)
HGB: 9.8 g/dL — ABNORMAL LOW (ref 11.6–15.9)
LYMPH%: 11.9 % — ABNORMAL LOW (ref 14.0–49.7)
MCH: 25.5 pg (ref 25.1–34.0)
MCV: 79.7 fL (ref 79.5–101.0)
MONO%: 12.5 % (ref 0.0–14.0)
Platelets: 309 10*3/uL (ref 145–400)
RDW: 21.7 % — ABNORMAL HIGH (ref 11.2–14.5)

## 2011-07-21 MED ORDER — DIPHENHYDRAMINE HCL 50 MG/ML IJ SOLN
25.0000 mg | Freq: Once | INTRAMUSCULAR | Status: AC
  Administered 2011-07-21: 25 mg via INTRAVENOUS

## 2011-07-21 MED ORDER — FAMOTIDINE IN NACL 20-0.9 MG/50ML-% IV SOLN
20.0000 mg | Freq: Once | INTRAVENOUS | Status: AC
  Administered 2011-07-21: 20 mg via INTRAVENOUS

## 2011-07-21 MED ORDER — SODIUM CHLORIDE 0.9 % IJ SOLN
10.0000 mL | INTRAMUSCULAR | Status: DC | PRN
  Administered 2011-07-21: 10 mL
  Filled 2011-07-21: qty 10

## 2011-07-21 MED ORDER — DEXAMETHASONE SODIUM PHOSPHATE 4 MG/ML IJ SOLN
4.0000 mg | Freq: Once | INTRAMUSCULAR | Status: AC
  Administered 2011-07-21: 4 mg via INTRAVENOUS

## 2011-07-21 MED ORDER — CLONIDINE HCL 0.1 MG PO TABS
0.1000 mg | ORAL_TABLET | Freq: Once | ORAL | Status: AC
  Administered 2011-07-21: 0.1 mg via ORAL

## 2011-07-21 MED ORDER — PACLITAXEL CHEMO INJECTION 300 MG/50ML
80.0000 mg/m2 | Freq: Once | INTRAVENOUS | Status: AC
  Administered 2011-07-21: 204 mg via INTRAVENOUS
  Filled 2011-07-21: qty 34

## 2011-07-21 MED ORDER — SODIUM CHLORIDE 0.9 % IV SOLN
Freq: Once | INTRAVENOUS | Status: AC
  Administered 2011-07-21: 12:00:00 via INTRAVENOUS

## 2011-07-21 MED ORDER — HEPARIN SOD (PORK) LOCK FLUSH 100 UNIT/ML IV SOLN
500.0000 [IU] | Freq: Once | INTRAVENOUS | Status: AC | PRN
  Administered 2011-07-21: 500 [IU]
  Filled 2011-07-21: qty 5

## 2011-07-21 MED ORDER — SODIUM CHLORIDE 0.9 % IV SOLN
1020.0000 mg | Freq: Once | INTRAVENOUS | Status: AC
  Administered 2011-07-21: 1020 mg via INTRAVENOUS
  Filled 2011-07-21: qty 34

## 2011-07-21 MED ORDER — ONDANSETRON 8 MG/50ML IVPB (CHCC)
8.0000 mg | Freq: Once | INTRAVENOUS | Status: AC
  Administered 2011-07-21: 8 mg via INTRAVENOUS

## 2011-07-21 NOTE — Telephone Encounter (Signed)
per Amy okay to not schedule md visit on 08/18/2011

## 2011-07-21 NOTE — Telephone Encounter (Signed)
Gv pt appt for [id number] ° °

## 2011-07-22 NOTE — Progress Notes (Signed)
Hematology and Oncology Follow Up Visit  [name] [id number] 1975/11/09 35 y.o. 07/21/11  Interim History:   The patient returns today for followup of locally advanced right breast carcinoma, due for her fourth of 12 planned weekly doses of neoadjuvant paclitaxel today. She did not receive treatment last week due to the Thanksgiving holiday. She continues to have quite a bit of fatigue. She also has a diagnosis of iron deficiency anemia, which is certainly contributing to the fatigue. She denies any signs of abnormal bleeding. She is status post blood transfusion 2 weeks ago and is also due for a dose of Fereheme today.   The patient has had some occasional tingling in her feet bilaterally but notes that this comes and goes. She's had no signs of neuropathy in the upper extremities. She continues to have some difficulties controlling her blood pressure, and during our exam today it was elevated at 142/105. She typically sees Dr. Tomma Lightning, and is scheduled to see her again in the next 2-3 weeks per her report. She has occasional headaches which are not unusual for her. In fact she does not even need pain medication. She's had no mouth ulcers or oral sensitivity. She has some mild taste aversion, but no nausea. No change in bowel habits. No excessive tearing. She has some hyperpigmentation of the skin and nail beds, but no nailbed sensitivity. She has occasional back pain which is chronic. She denies any weakness in the extremities.  A detailed review of systems is otherwise noncontributory as noted below.  Review of Systems: Constitutional: fatigued,  no weight loss, fever, night sweats Eyes: negative QIO:[id number] Cardiovascular: no chest pain or dyspnea on exertion Respiratory: no cough, shortness of breath, or wheezing Neurological: positive for - headaches Dermatological: positive for  hyperpigmentation Gastrointestinal: no abdominal pain, change in bowel habits, or black or bloody  stools Genito-Urinary: no dysuria, trouble voiding, or hematuria Hematological and Lymphatic: negative Breast: negative Musculoskeletal: positive for - pain in back Remaining ROS negative.  Medications: I have reviewed the patient's current medications.  Allergies: No Known Allergies   Physical Exam: Blood pressure 158/121, pulse 91, temperature 98.2 F (36.8 C), temperature source Oral, height 5\' 7"  (1.702 m), weight 318 lb 1.6 oz (144.289 kg), last menstrual period 10/24/2010. HEENT:  Sclerae anicteric, conjunctivae pale.  Oropharynx clear.  No mucositis or candidiasis.  Nodes:  No cervical, supraclavicular, or axillary lymphadenopathy palpated.  Breast Exam:  Unable to palpate a distinct mass in the right breast today. No obvious skin changes, no nipple inversion. Left breast is benign.  No masses, discharge, skin change, or nipple inversion..  Lungs:  Clear to auscultation bilaterally.  No crackles, rhonchi, or wheezes.  Heart:  Regular rate and rhythm.  Abdomen:  Soft, obese, nontender.  Positive bowel sounds. Musculoskeletal:  No focal spinal tenderness to palpation.  Extremities:  Benign.  No peripheral edema or cyanosis.  Skin:  Benign with the exception of hyperpigmentation bilaterally on the hands and nailbeds.  Neuro:  Nonfocal.   Lab Results: Lab Results  Component Value Date   WBC 7.0 07/21/2011   HGB 9.8* 07/21/2011   HCT 30.7* 07/21/2011   MCV 79.7 07/21/2011   PLT 309 07/21/2011   NEUTROABS 5.1 07/21/2011     Chemistry      Component Value Date/Time   NA 138 07/21/2011 1026   K 3.4* 07/21/2011 1026   CL 100 07/21/2011 1026   CO2 30 07/21/2011 1026   BUN 8 07/21/2011 1026  CREATININE 0.66 07/21/2011 1026      Component Value Date/Time   CALCIUM 9.7 07/21/2011 1026   ALKPHOS 106 07/21/2011 1026   AST 27 07/21/2011 1026   ALT 24 07/21/2011 1026   BILITOT 0.4 07/21/2011 1026         Impression and Plan: 35 year old [place] woman, status post right  breast biopsy in July 2012 for a clinical T3 N1 invasive ductal carcinoma. Tumor was strongly ER positive, PR positive, HER-2/neu negative, with a very elevated MIB-1 of 98%. Grade 3. Currently receiving neoadjuvant chemotherapy, status post 4 dose dense cycles of doxorubicin/cyclophosphamide with Neulasta on day 2 for granulocyte support. Now receiving weekly paclitaxel, due for her fourth of 12 planned doses today. The plan is to complete 4 doses of doxorubicin and cyclophosphamide, followed by 12 weekly doses of paclitaxel in the neoadjuvant setting prior to definitive surgery. Patient also has a history of hypertension, poorly controlled, and iron deficiency anemia, now symptomatic. Status post blood transfusion 2 weeks ago with some improvement, also due for [facility] today.  This case was reviewed with Dr. Darnelle Catalan. The patient will proceed to treatment today as scheduled for her fourth weekly dose of paclitaxel as well as her first of 2 planned weekly doses of Fereheme. We are giving her a dose of clonidine here in the office. She will continue with her blood pressure medications at home, and we'll keep her appointment with her primary care physician in the next couple of weeks. I have also advised her to monitor her blood pressure at home, and of course we'll continue to monitor that closely here in our office as well.  She'll return next week for followup with Dr. Darnelle Catalan, prior to her fifth dose of paclitaxel and her second dose of Fereheme. I will see her in 2 weeks, prior to cycle 6.  This plan was reviewed with the patient, who voices understanding and agreement.  She knows to call with any changes or problems.    Emanuelle Hammerstrom, PA-C 11/30/201210:49 AM

## 2011-07-28 ENCOUNTER — Ambulatory Visit: Payer: Medicaid Other | Admitting: Oncology

## 2011-07-28 ENCOUNTER — Other Ambulatory Visit: Payer: Self-pay | Admitting: Oncology

## 2011-07-28 ENCOUNTER — Ambulatory Visit (HOSPITAL_BASED_OUTPATIENT_CLINIC_OR_DEPARTMENT_OTHER): Payer: Medicaid Other | Admitting: Oncology

## 2011-07-28 ENCOUNTER — Other Ambulatory Visit (HOSPITAL_BASED_OUTPATIENT_CLINIC_OR_DEPARTMENT_OTHER): Payer: Medicaid Other | Admitting: Lab

## 2011-07-28 ENCOUNTER — Ambulatory Visit: Payer: Medicaid Other

## 2011-07-28 VITALS — BP 166/104 | HR 105 | Temp 98.4°F | Ht 67.0 in | Wt 311.5 lb

## 2011-07-28 DIAGNOSIS — Z170 Estrogen receptor positive status [ER+]: Secondary | ICD-10-CM

## 2011-07-28 DIAGNOSIS — C50419 Malignant neoplasm of upper-outer quadrant of unspecified female breast: Secondary | ICD-10-CM

## 2011-07-28 DIAGNOSIS — R52 Pain, unspecified: Secondary | ICD-10-CM

## 2011-07-28 DIAGNOSIS — R209 Unspecified disturbances of skin sensation: Secondary | ICD-10-CM

## 2011-07-28 DIAGNOSIS — Z5111 Encounter for antineoplastic chemotherapy: Secondary | ICD-10-CM

## 2011-07-28 DIAGNOSIS — C50919 Malignant neoplasm of unspecified site of unspecified female breast: Secondary | ICD-10-CM

## 2011-07-28 DIAGNOSIS — Z5189 Encounter for other specified aftercare: Secondary | ICD-10-CM

## 2011-07-28 LAB — CBC WITH DIFFERENTIAL/PLATELET
BASO%: 1 % (ref 0.0–2.0)
HCT: 30.4 % — ABNORMAL LOW (ref 34.8–46.6)
HGB: 9.6 g/dL — ABNORMAL LOW (ref 11.6–15.9)
MCHC: 31.6 g/dL (ref 31.5–36.0)
MONO#: 0.5 10*3/uL (ref 0.1–0.9)
NEUT#: 3.5 10*3/uL (ref 1.5–6.5)
NEUT%: 66.5 % (ref 38.4–76.8)
WBC: 5.2 10*3/uL (ref 3.9–10.3)
lymph#: 1 10*3/uL (ref 0.9–3.3)
nRBC: 0 % (ref 0–0)

## 2011-07-28 LAB — COMPREHENSIVE METABOLIC PANEL
ALT: 43 U/L — ABNORMAL HIGH (ref 0–35)
CO2: 31 mEq/L (ref 19–32)
Calcium: 10.2 mg/dL (ref 8.4–10.5)
Chloride: 100 mEq/L (ref 96–112)
Sodium: 139 mEq/L (ref 135–145)
Total Bilirubin: 0.6 mg/dL (ref 0.3–1.2)
Total Protein: 7.5 g/dL (ref 6.0–8.3)

## 2011-07-28 NOTE — Progress Notes (Signed)
ID: Clemetine Marker  HPI:  35 year old [place] woman who developed pain in her right breast and brought it to her primary care physician's attention. Mammography at [facility] 03/10/2011 found the breasts to be heterogeneously dense. There was a mildly irregular mass posteriorly in the upper outer quadrant of the right breast. On ultrasound this was hypoechoic and measured approximately 2.4 cm, with irregular margins. The left breast was unremarkable.  On 03/14/2011 biopsy of the right breast mass showed ([id number]) an invasive ductal carcinoma, grade 3, 99% estrogen receptor and 100% progesterone receptor positive, with an MIB-1-1 of 98%, and no HER-2 amplification. Bilateral breast MRIs were obtained 03/18/2011, showing in the upper outer quadrant of the right breast an irregular mass measuring 5.5 cm. There was a suggestion of cortical thickening in the level I right axillary lymph node, which measured 1.2 cm. The patient was staged with chest CT scan and PET scan, which showed no distant disease. These studies did confirm the abnormality in the right breast as well as a hypermetabolic right axillary lymph node, making this a clinical T3 N1 or stage III invasive ductal carcinoma. The patient was presented at the multidisciplinary breast cancer clinic and it was felt neoadjuvant chemotherapy would be in her best interest, to be followed by surgery, then radiation, then anti-estrogen therapy.  Interval History: Today Roe Coombs would be due for her sixth cycle of weekly Taxol. She is a bit depressed partly because of family issues (she is having a legal concerns regarding her sister).  ROS: She's also having numbness in her feet, which feel like they are "sleep all the time". She has no peripheral neuropathy symptoms involving her hands. She does get pain in her knees which pretty much in mobilizer for about 4 days after each Taxol treatment. She has taken Aleve for this with very little benefit.  She describes  herself as having chills, pain in the left breast, which is intermittent and sharp, some blurred vision, problems with a runny nose after every cycle, a little bit of hoarseness and a dry cough, some back pain, and occasional headaches. There have been no fevers no bleeding and no rash. The port is functioning well. She had 2 menstrual cycles in October, none since. She is not having hot flashes.    Medications: I have reviewed the patient's current medications.  Current Outpatient Prescriptions  Medication Sig Dispense Refill  . LORazepam (ATIVAN) 0.5 MG tablet Take 0.5 mg by mouth 2 (two) times daily as needed.        . metoCLOPramide (REGLAN) 10 MG tablet Take 10 mg by mouth 4 (four) times daily as needed.        Marland Kitchen olmesartan-hydrochlorothiazide (BENICAR HCT) 20-12.5 MG per tablet Take 1 tablet by mouth daily.        . pantoprazole (PROTONIX) 40 MG tablet Take 40 mg by mouth daily.        . prochlorperazine (COMPAZINE) 10 MG tablet Take 10 mg by mouth every 6 (six) hours as needed.        . ranitidine (ZANTAC) 15 MG/ML syrup Take 50 mg by mouth 2 (two) times daily.        Marland Kitchen senna-docusate (SENOKOT-S) 8.6-50 MG per tablet Take 1-2 tablets by mouth at bedtime.  20 tablet  0  . fluconazole (DIFLUCAN) 100 MG tablet Take 100 mg by mouth daily.         No current facility-administered medications for this visit.   Facility-Administered Medications Ordered in Other Visits  Medication Dose Route Frequency Provider Last Rate Last Dose  . 0.9 %  sodium chloride infusion   Intravenous Once Zollie Scale, PA         Objective:  Filed Vitals:   07/28/11 1154  BP: 166/104  Pulse: 105  Temp: 98.4 F (36.9 C)    Physical Exam:    Sclerae unicteric  Oropharynx clear  No peripheral adenopathy including no palpable mass in the right axilla.  Lungs clear -- no rales or rhonchi  Heart regular rate and rhythm  Abdomen benign  MSK no focal spinal tenderness, no peripheral edema  Neuro  nonfocal  Breast exam: I do not palpate any definite mass in the right breast. Skin is not affected; there is no nipple retraction. The left breast is unremarkable.  Lab Results:   CMP    Chemistry      Component Value Date/Time   NA 138 07/21/2011 1026   K 3.4* 07/21/2011 1026   CL 100 07/21/2011 1026   CO2 30 07/21/2011 1026   BUN 8 07/21/2011 1026   CREATININE 0.66 07/21/2011 1026      Component Value Date/Time   CALCIUM 9.7 07/21/2011 1026   ALKPHOS 106 07/21/2011 1026   AST 27 07/21/2011 1026   ALT 24 07/21/2011 1026   BILITOT 0.4 07/21/2011 1026       CBC Lab Results  Component Value Date   WBC 5.2 07/28/2011   HGB 9.6* 07/28/2011   HCT 30.4* 07/28/2011   MCV 81.1 07/28/2011   PLT 286 07/28/2011   NEUTROABS 3.5 07/28/2011    Studies/Results  *RADIOLOGY REPORT*  Jun 14, 2011 Clinical Data: Invasive ductal carcinoma of the right breast.  Follow-up evaluation while on neoadjuvant therapy.  BILATERAL BREAST MRI WITH AND WITHOUT CONTRAST  Technique: Multiplanar, multisequence MR images of both breasts  were obtained prior to and following the intravenous administration  of 20ml of Multihance. Three dimensional images were evaluated at  the independent DynaCad workstation.  Comparison: 03/18/2011.  Findings: There is a biopsy-proven enhancing malignancy within the  upper-outer quadrant of the right breast posteriorly located. The  intensity of enhancement has decreased as has the size of the mass.  The mass previously measured 5.5 x 2.6 x 3.7 cm in size and now  measures 3.7 x 2.0 x 1.9 cm in size. Also, the previously  described mildly prominent level I right axillary lymph node has  decreased in size and now measures 7 mm in greatest dimension  (previously 1.2 cm in greatest dimension). There are no new masses  and the remainder of the study is unchanged.  IMPRESSION:  Decrease in size and intensity of enhancement associated with the  biopsy proven malignancy  located within the upper-outer quadrant of  the right breast. The mass now measures 3.7 x 2.0 x 1.9 cm in  size.  THREE-DIMENSIONAL MR IMAGE RENDERING ON INDEPENDENT WORKSTATION:  Three-dimensional MR images were rendered by post-processing of the  original MR data on an independent workstation. The three-  dimensional MR images were interpreted, and findings were reported  in the accompanying complete MRI report for this study.  BI-RADS CATEGORY 6: Known biopsy-proven malignancy - appropriate  action should be taken.  Original Report Authenticated By: Rolla Plate, M.D.      Assessment: 35 year old [place] woman status post right breast biopsy July of 2012 for a clinical T3 N1 (stage III) invasive ductal carcinoma, grade 3, strongly estrogen and progesterone receptor positive, HER-2 negative, with an MIB-1-1 of  98%.  (1) Status post 4 cycles of dose dense doxorubicin and cyclophosphamide, followed by weekly paclitaxel currently to receive cycle 8 of 12 planned.   Plan: I am concerned with the fact that her feet are still numb. We are holding her treatment today. We will see her again in one week. If the numbness has largely resolved, we will resume Taxol. Otherwise we will change her treatment to Taxotere, and seeing that she has a ready to receive 5 doses of weekly Taxol I would only plan for 2 cycles of Taxotere given 3 weeks apart, before repeating her MRI.  Today I wrote her for Percocet 325/5 to take twice daily for the 4 days that she has pain after each Taxol treatment. She understands the fact that this medication may constipate her and cause nausea or sleepiness.  MAGRINAT,GUSTAV C 07/28/2011

## 2011-08-04 ENCOUNTER — Ambulatory Visit (HOSPITAL_BASED_OUTPATIENT_CLINIC_OR_DEPARTMENT_OTHER): Payer: Medicaid Other

## 2011-08-04 ENCOUNTER — Encounter: Payer: Self-pay | Admitting: Physician Assistant

## 2011-08-04 ENCOUNTER — Ambulatory Visit (HOSPITAL_BASED_OUTPATIENT_CLINIC_OR_DEPARTMENT_OTHER): Payer: Medicaid Other | Admitting: Physician Assistant

## 2011-08-04 ENCOUNTER — Other Ambulatory Visit: Payer: Self-pay | Admitting: Oncology

## 2011-08-04 ENCOUNTER — Other Ambulatory Visit (HOSPITAL_BASED_OUTPATIENT_CLINIC_OR_DEPARTMENT_OTHER): Payer: Medicaid Other | Admitting: Lab

## 2011-08-04 VITALS — BP 151/111 | HR 90 | Temp 98.3°F | Ht 67.0 in | Wt 314.5 lb

## 2011-08-04 DIAGNOSIS — I10 Essential (primary) hypertension: Secondary | ICD-10-CM

## 2011-08-04 DIAGNOSIS — Z5111 Encounter for antineoplastic chemotherapy: Secondary | ICD-10-CM

## 2011-08-04 DIAGNOSIS — C50419 Malignant neoplasm of upper-outer quadrant of unspecified female breast: Secondary | ICD-10-CM

## 2011-08-04 DIAGNOSIS — Z170 Estrogen receptor positive status [ER+]: Secondary | ICD-10-CM

## 2011-08-04 DIAGNOSIS — D509 Iron deficiency anemia, unspecified: Secondary | ICD-10-CM

## 2011-08-04 DIAGNOSIS — C50919 Malignant neoplasm of unspecified site of unspecified female breast: Secondary | ICD-10-CM

## 2011-08-04 LAB — CBC WITH DIFFERENTIAL/PLATELET
BASO%: 0.2 % (ref 0.0–2.0)
EOS%: 2.4 % (ref 0.0–7.0)
LYMPH%: 12 % — ABNORMAL LOW (ref 14.0–49.7)
MCHC: 31.4 g/dL — ABNORMAL LOW (ref 31.5–36.0)
MONO#: 0.8 10*3/uL (ref 0.1–0.9)
MONO%: 13.3 % (ref 0.0–14.0)
Platelets: 337 10*3/uL (ref 145–400)
RBC: 3.66 10*6/uL — ABNORMAL LOW (ref 3.70–5.45)
WBC: 6.3 10*3/uL (ref 3.9–10.3)
nRBC: 1 % — ABNORMAL HIGH (ref 0–0)

## 2011-08-04 MED ORDER — SODIUM CHLORIDE 0.9 % IJ SOLN
10.0000 mL | INTRAMUSCULAR | Status: DC | PRN
  Administered 2011-08-04: 10 mL
  Filled 2011-08-04: qty 10

## 2011-08-04 MED ORDER — CLONIDINE HCL 0.1 MG PO TABS
0.2000 mg | ORAL_TABLET | Freq: Once | ORAL | Status: DC
  Administered 2011-08-04: 0.2 mg via ORAL

## 2011-08-04 MED ORDER — SODIUM CHLORIDE 0.9 % IV SOLN
1020.0000 mg | Freq: Once | INTRAVENOUS | Status: AC
  Administered 2011-08-04: 1020 mg via INTRAVENOUS
  Filled 2011-08-04: qty 34

## 2011-08-04 MED ORDER — FAMOTIDINE IN NACL 20-0.9 MG/50ML-% IV SOLN
20.0000 mg | Freq: Once | INTRAVENOUS | Status: AC
  Administered 2011-08-04: 20 mg via INTRAVENOUS

## 2011-08-04 MED ORDER — DIPHENHYDRAMINE HCL 50 MG/ML IJ SOLN
25.0000 mg | Freq: Once | INTRAMUSCULAR | Status: AC
  Administered 2011-08-04: 25 mg via INTRAVENOUS

## 2011-08-04 MED ORDER — ONDANSETRON 8 MG/50ML IVPB (CHCC)
8.0000 mg | Freq: Once | INTRAVENOUS | Status: AC
  Administered 2011-08-04: 8 mg via INTRAVENOUS

## 2011-08-04 MED ORDER — PACLITAXEL CHEMO INJECTION 300 MG/50ML
80.0000 mg/m2 | Freq: Once | INTRAVENOUS | Status: AC
  Administered 2011-08-04: 204 mg via INTRAVENOUS
  Filled 2011-08-04: qty 34

## 2011-08-04 MED ORDER — SODIUM CHLORIDE 0.9 % IV SOLN
Freq: Once | INTRAVENOUS | Status: AC
  Administered 2011-08-04: 12:00:00 via INTRAVENOUS

## 2011-08-04 MED ORDER — DEXAMETHASONE SODIUM PHOSPHATE 10 MG/ML IJ SOLN
4.0000 mg | Freq: Once | INTRAMUSCULAR | Status: AC
  Administered 2011-08-04: 4 mg via INTRAVENOUS

## 2011-08-04 MED ORDER — HEPARIN SOD (PORK) LOCK FLUSH 100 UNIT/ML IV SOLN
500.0000 [IU] | Freq: Once | INTRAVENOUS | Status: AC | PRN
  Administered 2011-08-04: 500 [IU]
  Filled 2011-08-04: qty 5

## 2011-08-04 NOTE — Progress Notes (Signed)
ID: [name]  HPI:  35 year old [place] woman who developed pain in her right breast and brought it to her primary care physician's attention. Mammography at [facility] 03/10/2011 found the breasts to be heterogeneously dense. There was a mildly irregular mass posteriorly in the upper outer quadrant of the right breast. On ultrasound this was hypoechoic and measured approximately 2.4 cm, with irregular margins. The left breast was unremarkable.  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 Bilateral breast MRIs were obtained 03/18/2011, showing in the upper outer quadrant of the right breast an irregular mass measuring 5.5 cm. There was a suggestion of cortical thickening in the level I right axillary lymph node, which measured 1.2 cm. The patient was staged with chest CT scan and PET scan, which showed no distant disease. These studies did confirm the abnormality in the right breast as well as a hypermetabolic right axillary lymph node, making this a clinical T3 N1 or stage III invasive ductal carcinoma. The patient was presented at the multidisciplinary breast cancer clinic and it was felt neoadjuvant chemotherapy would be in her best interest, to be followed by surgery, then radiation, then anti-estrogen therapy.  Interval History:  Patient returns today for followup of her locally advanced right breast carcinoma. She is being treated in the neoadjuvant setting and is due for her sixth weekly dose of paclitaxel today. Paclitaxel was held one week ago on 07/28/2011 due to increased peripheral neuropathy in her feet. This has improved significantly. The patient tells me that if the numbness was a 2 on a scale of 1-10 last week, it is an 8 on a scale of 1-10 today (this would be with one being the worst). She describes as being  persistent but very mild at this point. Is not affecting her walking, or any of her day-to-day activities. She's had no signs of numbness or tingling in the upper extremities.  Otherwise the patient's blood pressure is very elevated again today and this has been a chronic problem. She admits that she has been out of her blood pressure medication for about 3 days, but is scheduled to see her primary care physician, Dr.  Gayleen Orem tomorrow. She's had some occasional headaches. She's had no change in vision or dizziness. She is some occasional nausea, but no emesis. She denies any chest pain, pressure or palpitations. She admits to some anxiety and has been under a lot of stress with some family situations. 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   A detailed review of systems is otherwise noncontributory as noted below.  Review of Systems: Constitutional: fatigue,  no weight loss, fever, night sweats Eyes: negative [id number] Cardiovascular: no chest pain or dyspnea on exertion Respiratory: no cough, shortness of breath, or wheezing Neurological: numbness/tingling in lower extremities, but improved Dermatological: negative Gastrointestinal: occasional nausea,no abdominal pain, change in bowel habits, or black or bloody stools Genito-Urinary: no dysuria, trouble voiding, or hematuria Hematological and Lymphatic: negative Breast: negative Musculoskeletal: negative Remaining ROS negative.      Medications: I have reviewed the patient's current medications.  Current Outpatient Prescriptions  Medication Sig Dispense Refill  . LORazepam (ATIVAN)  0.5 MG tablet Take 0.5 mg by mouth 2 (two) times daily as needed.        . metoCLOPramide (REGLAN) 10 MG tablet Take 10 mg by mouth 4 (four) times daily as needed.         Marland Kitchen olmesartan-hydrochlorothiazide (BENICAR HCT) 20-12.5 MG per tablet Take 1 tablet by mouth daily.        Marland Kitchen oxyCODONE-acetaminophen (PERCOCET) 5-325 MG per tablet Take 1 tablet by mouth every 6 (six) hours as needed.        . pantoprazole (PROTONIX) 40 MG tablet Take 40 mg by mouth daily.        . prochlorperazine (COMPAZINE) 10 MG tablet Take 10 mg by mouth every 6 (six) hours as needed.        . ranitidine (ZANTAC) 15 MG/ML syrup Take 50 mg by mouth 2 (two) times daily.        Marland Kitchen senna-docusate (SENOKOT-S) 8.6-50 MG per tablet Take 1-2 tablets by mouth at bedtime.  20 tablet  0  . fluconazole (DIFLUCAN) 100 MG tablet Take 100 mg by mouth daily.         Current Facility-Administered Medications  Medication Dose Route Frequency Provider Last Rate Last Dose  . cloNIDine (CATAPRES) tablet 0.2 mg  0.2 mg Oral Once Zollie Scale, PA       Facility-Administered Medications Ordered in Other Visits  Medication Dose Route Frequency Provider Last Rate Last Dose  . 0.9 %  sodium chloride infusion   Intravenous Once Zollie Scale, PA         Objective:   Physical Exam: Filed Vitals:   08/04/11 1049  BP: 151/111  Pulse: 90  Temp: 98.3 F (36.8 C)   HEENT:  Sclerae anicteric, conjunctivae pale.  Oropharynx clear.  No mucositis or candidiasis.   Nodes:  No cervical, supraclavicular, or axillary lymphadenopathy palpated.  Breast Exam: Deferred Lungs:  Clear to auscultation bilaterally.  No crackles, rhonchi, or wheezes.   Heart:  Regular rate and rhythm.   Abdomen:  Soft, obese, nontender.  Positive bowel sounds.   Musculoskeletal:  No focal spinal tenderness to palpation.  Extremities:  Benign.  No peripheral edema or cyanosis.   Skin:  Benign.   Neuro:  Nonfocal.  Lab Results:   CMP    Chemistry      Component Value Date/Time   NA 139 07/28/2011 1140   K 3.5 07/28/2011 1140   CL 100 07/28/2011 1140   CO2 31 07/28/2011 1140   BUN 12 07/28/2011 1140   CREATININE 0.71 07/28/2011 1140        Component Value Date/Time   CALCIUM 10.2 07/28/2011 1140   ALKPHOS 109 07/28/2011 1140   AST 36 07/28/2011 1140   ALT 43* 07/28/2011 1140   BILITOT 0.6 07/28/2011 1140       CBC Lab Results  Component Value Date   WBC 6.3 08/04/2011   HGB 9.4* 08/04/2011   HCT 29.9* 08/04/2011   MCV 81.7 08/04/2011   PLT 337 08/04/2011   NEUTROABS 4.5 08/04/2011    Studies/Results  *RADIOLOGY REPORT*  Jun 14, 2011 Clinical Data: Invasive ductal carcinoma of the right breast.  Follow-up evaluation while on neoadjuvant therapy.  BILATERAL BREAST MRI WITH AND WITHOUT CONTRAST  Technique: Multiplanar, multisequence MR images of both breasts  were obtained prior to and following the intravenous administration  of 20ml of Multihance. Three dimensional images were evaluated at  the independent DynaCad workstation.  Comparison: 03/18/2011.  Findings: There is a  biopsy-proven enhancing malignancy within the  upper-outer quadrant of the right breast posteriorly located. The  intensity of enhancement has decreased as has the size of the mass.  The mass previously measured 5.5 x 2.6 x 3.7 cm in size and now  measures 3.7 x 2.0 x 1.9 cm in size. Also, the previously  described mildly prominent level I right axillary lymph node has  decreased in size and now measures 7 mm in greatest dimension  (previously 1.2 cm in greatest dimension). There are no new masses  and the remainder of the study is unchanged.  IMPRESSION:  Decrease in size and intensity of enhancement associated with the  biopsy proven malignancy located within the upper-outer quadrant of  the right breast. The mass now measures 3.7 x 2.0 x 1.9 cm in  size.  THREE-DIMENSIONAL MR IMAGE RENDERING ON INDEPENDENT WORKSTATION:  Three-dimensional MR images were rendered by post-processing of the  original MR data on an independent workstation. The three-  dimensional MR images were interpreted, and findings were reported  in the accompanying  complete MRI report for this study.  BI-RADS CATEGORY 6: Known biopsy-proven malignancy - appropriate  action should be taken.  Original Report Authenticated By: Rolla Plate, M.D.      Assessment: 35 year old [place] woman status post right breast biopsy July of 2012 for a clinical T3 N1 (stage III) invasive ductal carcinoma, grade 3, strongly estrogen and progesterone receptor positive, HER-2 negative, with an MIB-1-1 of 98%.  (1) Status post 4 cycles of dose dense doxorubicin and cyclophosphamide, followed by weekly paclitaxel currently to receive cycle 5 of 12 planned.  (2) Iron deficiency anemia, due to [facility] today.  (3) Hypertension, poorly controlled.   Plan: The patient will proceed to treatment today as scheduled for her fifth weekly dose of paclitaxel. It seems to her peripheral neuropathy has improved significantly since holding her treatment last week, and the patient is very much in favor of proceeding with treatment today as scheduled. We'll continue to follow this very closely, and in fact she scheduled to see me again next week on December 20 prior to her sixth scheduled dose of paclitaxel.  Also as noted above, the patient is scheduled to see her primary care physician tomorrow to evaluate her uncontrolled hypertension. We are giving her a dose of clonidine here in the office today and will be monitoring her blood pressure very closely.  This plan was reviewed with the patient who voices her understanding and agreement. She knows as always to call with any changes or problems.   Maritza Goldsborough, PA-C 08/04/2011

## 2011-08-08 ENCOUNTER — Other Ambulatory Visit: Payer: Self-pay | Admitting: *Deleted

## 2011-08-08 DIAGNOSIS — C50919 Malignant neoplasm of unspecified site of unspecified female breast: Secondary | ICD-10-CM

## 2011-08-10 MED ORDER — OXYCODONE-ACETAMINOPHEN 5-325 MG PO TABS: 1.0000 | ORAL_TABLET | Freq: Two times a day (BID) | ORAL | Status: DC | PRN

## 2011-08-11 ENCOUNTER — Ambulatory Visit (HOSPITAL_BASED_OUTPATIENT_CLINIC_OR_DEPARTMENT_OTHER): Payer: Medicaid Other

## 2011-08-11 ENCOUNTER — Other Ambulatory Visit (HOSPITAL_BASED_OUTPATIENT_CLINIC_OR_DEPARTMENT_OTHER): Payer: Medicaid Other | Admitting: Lab

## 2011-08-11 ENCOUNTER — Ambulatory Visit (HOSPITAL_BASED_OUTPATIENT_CLINIC_OR_DEPARTMENT_OTHER): Payer: Medicaid Other | Admitting: Physician Assistant

## 2011-08-11 ENCOUNTER — Other Ambulatory Visit: Payer: Self-pay | Admitting: Oncology

## 2011-08-11 ENCOUNTER — Encounter: Payer: Self-pay | Admitting: Physician Assistant

## 2011-08-11 VITALS — BP 151/85 | HR 101 | Temp 98.3°F | Ht 67.0 in | Wt 312.0 lb

## 2011-08-11 DIAGNOSIS — Z5111 Encounter for antineoplastic chemotherapy: Secondary | ICD-10-CM

## 2011-08-11 DIAGNOSIS — C50419 Malignant neoplasm of upper-outer quadrant of unspecified female breast: Secondary | ICD-10-CM

## 2011-08-11 DIAGNOSIS — Z170 Estrogen receptor positive status [ER+]: Secondary | ICD-10-CM

## 2011-08-11 DIAGNOSIS — G609 Hereditary and idiopathic neuropathy, unspecified: Secondary | ICD-10-CM

## 2011-08-11 DIAGNOSIS — C50919 Malignant neoplasm of unspecified site of unspecified female breast: Secondary | ICD-10-CM

## 2011-08-11 DIAGNOSIS — D509 Iron deficiency anemia, unspecified: Secondary | ICD-10-CM

## 2011-08-11 DIAGNOSIS — Z5189 Encounter for other specified aftercare: Secondary | ICD-10-CM

## 2011-08-11 DIAGNOSIS — I10 Essential (primary) hypertension: Secondary | ICD-10-CM

## 2011-08-11 LAB — CBC WITH DIFFERENTIAL/PLATELET
BASO%: 0.2 % (ref 0.0–2.0)
Basophils Absolute: 0 10*3/uL (ref 0.0–0.1)
HCT: 29 % — ABNORMAL LOW (ref 34.8–46.6)
HGB: 9.4 g/dL — ABNORMAL LOW (ref 11.6–15.9)
LYMPH%: 17.7 % (ref 14.0–49.7)
MCHC: 32.5 g/dL (ref 31.5–36.0)
MONO#: 0.3 10*3/uL (ref 0.1–0.9)
NEUT%: 74.6 % (ref 38.4–76.8)
Platelets: 331 10*3/uL (ref 145–400)
WBC: 5.2 10*3/uL (ref 3.9–10.3)
lymph#: 0.9 10*3/uL (ref 0.9–3.3)

## 2011-08-11 LAB — COMPREHENSIVE METABOLIC PANEL
ALT: 35 U/L (ref 0–35)
AST: 30 U/L (ref 0–37)
BUN: 13 mg/dL (ref 6–23)
CO2: 30 mEq/L (ref 19–32)
Calcium: 9.9 mg/dL (ref 8.4–10.5)
Creatinine, Ser: 0.67 mg/dL (ref 0.50–1.10)
Total Bilirubin: 0.5 mg/dL (ref 0.3–1.2)

## 2011-08-11 MED ORDER — HEPARIN SOD (PORK) LOCK FLUSH 100 UNIT/ML IV SOLN
500.0000 [IU] | Freq: Once | INTRAVENOUS | Status: AC | PRN
  Administered 2011-08-11: 500 [IU]
  Filled 2011-08-11: qty 5

## 2011-08-11 MED ORDER — FAMOTIDINE IN NACL 20-0.9 MG/50ML-% IV SOLN
20.0000 mg | Freq: Once | INTRAVENOUS | Status: AC
  Administered 2011-08-11: 20 mg via INTRAVENOUS

## 2011-08-11 MED ORDER — DIPHENHYDRAMINE HCL 50 MG/ML IJ SOLN
25.0000 mg | Freq: Once | INTRAMUSCULAR | Status: AC
  Administered 2011-08-11: 25 mg via INTRAVENOUS

## 2011-08-11 MED ORDER — ONDANSETRON 8 MG/50ML IVPB (CHCC)
8.0000 mg | Freq: Once | INTRAVENOUS | Status: AC
  Administered 2011-08-11: 8 mg via INTRAVENOUS

## 2011-08-11 MED ORDER — SODIUM CHLORIDE 0.9 % IJ SOLN
10.0000 mL | INTRAMUSCULAR | Status: DC | PRN
  Administered 2011-08-11: 10 mL
  Filled 2011-08-11: qty 10

## 2011-08-11 MED ORDER — DEXAMETHASONE SODIUM PHOSPHATE 4 MG/ML IJ SOLN
4.0000 mg | Freq: Once | INTRAMUSCULAR | Status: AC
  Administered 2011-08-11: 4 mg via INTRAVENOUS

## 2011-08-11 MED ORDER — SODIUM CHLORIDE 0.9 % IV SOLN
Freq: Once | INTRAVENOUS | Status: DC
Start: 2011-08-11 — End: 2011-08-11

## 2011-08-11 MED ORDER — PACLITAXEL CHEMO INJECTION 300 MG/50ML
80.0000 mg/m2 | Freq: Once | INTRAVENOUS | Status: AC
  Administered 2011-08-11: 204 mg via INTRAVENOUS
  Filled 2011-08-11: qty 34

## 2011-08-11 NOTE — Progress Notes (Signed)
ID: [name]  HPI:  35 year old [name] woman who developed pain in her right breast and brought it to her primary care physician's attention. Mammography at [facility] 03/10/2011 found the breasts to be heterogeneously dense. There was a mildly irregular mass posteriorly in the upper outer quadrant of the right breast. On ultrasound this was hypoechoic and measured approximately 2.4 cm, with irregular margins. The left breast was unremarkable.  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 Bilateral breast MRIs were obtained 03/18/2011, showing in the upper outer quadrant of the right breast an irregular mass measuring 5.5 cm. There was a suggestion of cortical thickening in the level I right axillary lymph node, which measured 1.2 cm. The patient was staged with chest CT scan and PET scan, which showed no distant disease. These studies did confirm the abnormality in the right breast as well as a hypermetabolic right axillary lymph node, making this a clinical T3 N1 or stage III invasive ductal carcinoma. The patient was presented at the multidisciplinary breast cancer clinic and it was felt neoadjuvant chemotherapy would be in her best interest, to be followed by surgery, then radiation, then anti-estrogen therapy.  Interval History:  Patient returns today for followup of her locally advanced right breast carcinoma. She is being treated in the neoadjuvant setting and is due for her sixth weekly dose of paclitaxel today.   We have been following the patient very closely for some early signs of peripheral neuropathy. At this point she tells me the neuropathy is actually improved. She still has some numbness and tingling in her feet but tells me it is mild, comes and goes, and is not affecting any of her walking or day-to-day  activities. She's had no numbness or tingling in the upper extremities.  The patient was seen by her primary care physician for hypertension last week. They have increased her hypertensive meds, and her blood pressure is a little better controlled today at 151/85. She's had no chest pain or pressure. She denies any abnormal headaches, dizziness, change in vision.  Otherwise, the patient continues to have some joint pain and bony pain treated effectively with Percocet. She has some occasional abdominal pain. She is having regular bowel movements, neither diarrhea nor constipation, and denies any blood in the stool. No nausea.  A detailed review of systems is otherwise noncontributory as noted below.  Review of Systems: Constitutional: fatigue,  no weight loss, fever, night sweats Eyes: negative [id number] Cardiovascular: no chest pain or dyspnea on exertion Respiratory: no cough, shortness of breath, or wheezing Neurological: numbness/tingling in lower extremities, but improved Dermatological: negative Gastrointestinal: occasional abdominal pain, no nausea, change in bowel habits, or black or bloody stools Genito-Urinary: no dysuria, trouble voiding, or hematuria Hematological and Lymphatic: negative Breast: negative Musculoskeletal: joint pain and muscle pain Remaining ROS negative.   PAST MEDICAL HISTORY:  Significant for hypertension, anemia, obesity, remote history of migraine (in childhood) and GERD.  The patient has had both her hips pinned because of falls at age 30.  FAMILY HISTORY:  [name] is alive at age 55.  The patient's father died from a myocardial infarction at the age of 60.  She has one sister and one brother.  There is no history of breast or ovarian cancer in the family.  GYNECOLOGIC HISTORY:  Menarche age 75.  Most recent period  July 8.  Her periods usually last about 4 days of which the first 2 are heavy.  She has never used birth control pills.  She is  GX P2.  First pregnancy to term age 15.  She also had a miscarriage.  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  Her husband of 3 years, [name], is present today.  He works in Holiday representative.  Daughter, [name], 15, and [name], 12, both live at home with the patient and her husband as does the patient's mother.  Medications: I have reviewed the patient's current medications.  Current Outpatient Prescriptions  Medication Sig Dispense Refill  . LORazepam (ATIVAN) 0.5 MG tablet Take 0.5 mg by mouth 2 (two) times daily as needed.        . metoCLOPramide (REGLAN) 10 MG tablet Take 10 mg by mouth 4 (four) times daily as needed.        Marland Kitchen olmesartan-hydrochlorothiazide (BENICAR HCT) 40-25 MG per tablet Take 1 tablet by mouth 2 (two) times daily.        Marland Kitchen oxyCODONE-acetaminophen (PERCOCET) 5-325 MG per tablet Take 1 tablet by mouth every 12 (twelve) hours as needed.  64 tablet  0  . pantoprazole (PROTONIX) 40 MG tablet Take 40 mg by mouth daily.        . prochlorperazine (COMPAZINE) 10 MG tablet Take 10 mg by mouth every 6 (six) hours as needed.        . ranitidine (ZANTAC) 15 MG/ML syrup Take 50 mg by mouth 2 (two) times daily.        Marland Kitchen senna-docusate (SENOKOT-S) 8.6-50 MG per tablet Take 1-2 tablets by mouth at bedtime.  20 tablet  0  . fluconazole (DIFLUCAN) 100 MG tablet Take 100 mg by mouth daily.         No current facility-administered medications for this visit.   Facility-Administered Medications Ordered in Other Visits  Medication Dose Route Frequency Provider Last Rate Last Dose  . 0.9 %  sodium chloride infusion   Intravenous Once Zollie Scale, PA         Objective:   Physical Exam: Filed Vitals:   08/11/11 1147  BP: 151/85  Pulse: 101  Temp: 98.3 F (36.8 C)   HEENT:  Sclerae anicteric, conjunctivae pale.  Oropharynx clear.  No mucositis or candidiasis.   Nodes:  No cervical, supraclavicular, or axillary lymphadenopathy palpated.  Breast  Exam: Deferred Lungs:  Clear to auscultation bilaterally.  No crackles, rhonchi, or wheezes.   Heart:  Regular rate and rhythm.   Abdomen:  Soft, obese, nontender.  Positive bowel sounds.   Musculoskeletal:  No focal spinal tenderness to palpation.  Extremities:  Benign.  No peripheral edema or cyanosis.   Skin:  Benign.   Neuro:  Nonfocal.  Lab Results:   CMP    Chemistry      Component Value Date/Time   NA 139 07/28/2011 1140   K 3.5 07/28/2011 1140   CL 100 07/28/2011 1140   CO2 31 07/28/2011 1140   BUN 12 07/28/2011 1140   CREATININE 0.71 07/28/2011 1140      Component Value Date/Time   CALCIUM 10.2 07/28/2011 1140   ALKPHOS 109 07/28/2011 1140   AST 36 07/28/2011 1140   ALT 43* 07/28/2011 1140   BILITOT 0.6 07/28/2011 1140       CBC Lab Results  Component Value Date   WBC 5.2 08/11/2011   HGB 9.4* 08/11/2011   HCT 29.0* 08/11/2011  MCV 83.8 08/11/2011   PLT 331 08/11/2011   NEUTROABS 3.9 08/11/2011    Studies/Results  *RADIOLOGY REPORT*  Jun 14, 2011 Clinical Data: Invasive ductal carcinoma of the right breast.  Follow-up evaluation while on neoadjuvant therapy.  BILATERAL BREAST MRI WITH AND WITHOUT CONTRAST  Technique: Multiplanar, multisequence MR images of both breasts  were obtained prior to and following the intravenous administration  of 20ml of Multihance. Three dimensional images were evaluated at  the independent DynaCad workstation.  Comparison: 03/18/2011.  Findings: There is a biopsy-proven enhancing malignancy within the  upper-outer quadrant of the right breast posteriorly located. The  intensity of enhancement has decreased as has the size of the mass.  The mass previously measured 5.5 x 2.6 x 3.7 cm in size and now  measures 3.7 x 2.0 x 1.9 cm in size. Also, the previously  described mildly prominent level I right axillary lymph node has  decreased in size and now measures 7 mm in greatest dimension  (previously 1.2 cm in greatest dimension).  There are no new masses  and the remainder of the study is unchanged.  IMPRESSION:  Decrease in size and intensity of enhancement associated with the  biopsy proven malignancy located within the upper-outer quadrant of  the right breast. The mass now measures 3.7 x 2.0 x 1.9 cm in  size.  THREE-DIMENSIONAL MR IMAGE RENDERING ON INDEPENDENT WORKSTATION:  Three-dimensional MR images were rendered by post-processing of the  original MR data on an independent workstation. The three-  dimensional MR images were interpreted, and findings were reported  in the accompanying complete MRI report for this study.  BI-RADS CATEGORY 6: Known biopsy-proven malignancy - appropriate  action should be taken.  Original Report Authenticated By: Rolla Plate, M.D.      Assessment: 35 year old [name] woman status post right breast biopsy July of 2012 for a clinical T3 N1 (stage III) invasive ductal carcinoma, grade 3, strongly estrogen and progesterone receptor positive, HER-2 negative, with an MIB-1-1 of 98%.  (1) Status post 4 cycles of dose dense doxorubicin and cyclophosphamide, followed by weekly paclitaxel x 12  (2) Iron deficiency anemia, treated with Fereheme  (3) Hypertension, poorly controlled.   Plan: The patient will proceed to treatment today as scheduled for her 6th weekly dose of paclitaxel. We'll continue to follow her very closely for any signs of increased peripheral neuropathy, which at this point appears to be mild and stable. She return next week for her seventh dose of paclitaxel, and I will see her in 2 weeks for followup prior to cycle 8.  This plan was reviewed with the patient who voices her understanding and agreement. She knows as always to call with any changes or problems.   Ayslin Kundert, PA-C 08/11/2011

## 2011-08-18 ENCOUNTER — Ambulatory Visit (HOSPITAL_BASED_OUTPATIENT_CLINIC_OR_DEPARTMENT_OTHER): Payer: Medicaid Other

## 2011-08-18 ENCOUNTER — Other Ambulatory Visit (HOSPITAL_BASED_OUTPATIENT_CLINIC_OR_DEPARTMENT_OTHER): Payer: Medicaid Other | Admitting: Lab

## 2011-08-18 VITALS — BP 110/65 | HR 117 | Temp 98.8°F

## 2011-08-18 DIAGNOSIS — Z5111 Encounter for antineoplastic chemotherapy: Secondary | ICD-10-CM

## 2011-08-18 DIAGNOSIS — Z170 Estrogen receptor positive status [ER+]: Secondary | ICD-10-CM

## 2011-08-18 DIAGNOSIS — C50919 Malignant neoplasm of unspecified site of unspecified female breast: Secondary | ICD-10-CM

## 2011-08-18 DIAGNOSIS — C50419 Malignant neoplasm of upper-outer quadrant of unspecified female breast: Secondary | ICD-10-CM

## 2011-08-18 LAB — CBC WITH DIFFERENTIAL/PLATELET
Basophils Absolute: 0 10*3/uL (ref 0.0–0.1)
HCT: 30 % — ABNORMAL LOW (ref 34.8–46.6)
HGB: 9.3 g/dL — ABNORMAL LOW (ref 11.6–15.9)
MCH: 26.6 pg (ref 25.1–34.0)
MONO#: 0.3 10*3/uL (ref 0.1–0.9)
NEUT%: 72.9 % (ref 38.4–76.8)
lymph#: 0.9 10*3/uL (ref 0.9–3.3)

## 2011-08-18 MED ORDER — PACLITAXEL CHEMO INJECTION 300 MG/50ML
80.0000 mg/m2 | Freq: Once | INTRAVENOUS | Status: AC
  Administered 2011-08-18: 204 mg via INTRAVENOUS
  Filled 2011-08-18: qty 34

## 2011-08-18 MED ORDER — ONDANSETRON 8 MG/50ML IVPB (CHCC)
8.0000 mg | Freq: Once | INTRAVENOUS | Status: AC
  Administered 2011-08-18: 8 mg via INTRAVENOUS

## 2011-08-18 MED ORDER — SODIUM CHLORIDE 0.9 % IJ SOLN
10.0000 mL | INTRAMUSCULAR | Status: DC | PRN
Start: 2011-08-18 — End: 2011-08-18
  Administered 2011-08-18: 10 mL
  Filled 2011-08-18: qty 10

## 2011-08-18 MED ORDER — FAMOTIDINE IN NACL 20-0.9 MG/50ML-% IV SOLN
20.0000 mg | Freq: Once | INTRAVENOUS | Status: AC
  Administered 2011-08-18: 20 mg via INTRAVENOUS

## 2011-08-18 MED ORDER — HEPARIN SOD (PORK) LOCK FLUSH 100 UNIT/ML IV SOLN
500.0000 [IU] | Freq: Once | INTRAVENOUS | Status: AC | PRN
  Administered 2011-08-18: 500 [IU]
  Filled 2011-08-18: qty 5

## 2011-08-18 MED ORDER — DIPHENHYDRAMINE HCL 50 MG/ML IJ SOLN
25.0000 mg | Freq: Once | INTRAMUSCULAR | Status: AC
  Administered 2011-08-18: 25 mg via INTRAVENOUS

## 2011-08-18 MED ORDER — SODIUM CHLORIDE 0.9 % IV SOLN
Freq: Once | INTRAVENOUS | Status: AC
  Administered 2011-08-18: 12:00:00 via INTRAVENOUS

## 2011-08-18 MED ORDER — DEXAMETHASONE SODIUM PHOSPHATE 4 MG/ML IJ SOLN
4.0000 mg | Freq: Once | INTRAMUSCULAR | Status: AC
  Administered 2011-08-18: 4 mg via INTRAVENOUS

## 2011-08-22 ENCOUNTER — Encounter (INDEPENDENT_AMBULATORY_CARE_PROVIDER_SITE_OTHER): Payer: Self-pay | Admitting: General Surgery

## 2011-08-22 ENCOUNTER — Ambulatory Visit (INDEPENDENT_AMBULATORY_CARE_PROVIDER_SITE_OTHER): Payer: Medicaid Other | Admitting: General Surgery

## 2011-08-22 VITALS — BP 124/68 | HR 92 | Temp 97.2°F | Resp 18 | Ht 67.0 in | Wt 311.0 lb

## 2011-08-22 DIAGNOSIS — C50919 Malignant neoplasm of unspecified site of unspecified female breast: Secondary | ICD-10-CM

## 2011-08-24 ENCOUNTER — Encounter (INDEPENDENT_AMBULATORY_CARE_PROVIDER_SITE_OTHER): Payer: Self-pay | Admitting: General Surgery

## 2011-08-24 NOTE — Progress Notes (Signed)
Subjective:     Patient ID: [name], female   DOB: 1975-08-28, 36 y.o.   MRN: [id number]  HPI The patient is a 36 year old black female who has a locally advanced right breast cancer in the upper outer quadrant. She has been getting neoadjuvant chemotherapy and the tumor has been responding. She is tolerating chemotherapy well. She has about 5 more treatments to go that she is getting on a weekly basis.  Review of Systems     Objective:   Physical Exam On physical exam she has now no palpable mass in either breast. No axillary subclavicular cervical lymphadenopathy.    Assessment:     Locally advanced right breast cancer in the upper-outer quadrant responding to neoadjuvant chemotherapy    Plan:     At this point she still is favoring breast conservation. If the tumor continues to shrink I think this will be a reasonable option for her. She also had genetic testing done and is reported as negative. We will plan to see her back in one month at which time will start planning for surgery.

## 2011-08-25 ENCOUNTER — Encounter: Payer: Self-pay | Admitting: Physician Assistant

## 2011-08-25 ENCOUNTER — Other Ambulatory Visit (HOSPITAL_BASED_OUTPATIENT_CLINIC_OR_DEPARTMENT_OTHER): Payer: Medicaid Other | Admitting: Lab

## 2011-08-25 ENCOUNTER — Other Ambulatory Visit: Payer: Self-pay | Admitting: Physician Assistant

## 2011-08-25 ENCOUNTER — Ambulatory Visit (HOSPITAL_BASED_OUTPATIENT_CLINIC_OR_DEPARTMENT_OTHER): Payer: Medicaid Other

## 2011-08-25 ENCOUNTER — Ambulatory Visit (HOSPITAL_BASED_OUTPATIENT_CLINIC_OR_DEPARTMENT_OTHER): Payer: Medicaid Other | Admitting: Physician Assistant

## 2011-08-25 VITALS — BP 108/76 | HR 118 | Temp 98.3°F | Ht 67.0 in | Wt 306.6 lb

## 2011-08-25 DIAGNOSIS — C50919 Malignant neoplasm of unspecified site of unspecified female breast: Secondary | ICD-10-CM

## 2011-08-25 DIAGNOSIS — Z170 Estrogen receptor positive status [ER+]: Secondary | ICD-10-CM

## 2011-08-25 DIAGNOSIS — Z5111 Encounter for antineoplastic chemotherapy: Secondary | ICD-10-CM

## 2011-08-25 DIAGNOSIS — I10 Essential (primary) hypertension: Secondary | ICD-10-CM

## 2011-08-25 DIAGNOSIS — C50419 Malignant neoplasm of upper-outer quadrant of unspecified female breast: Secondary | ICD-10-CM

## 2011-08-25 DIAGNOSIS — D509 Iron deficiency anemia, unspecified: Secondary | ICD-10-CM

## 2011-08-25 LAB — CBC WITH DIFFERENTIAL/PLATELET
BASO%: 0.6 % (ref 0.0–2.0)
EOS%: 2.1 % (ref 0.0–7.0)
MCH: 26.9 pg (ref 25.1–34.0)
MCV: 85.6 fL (ref 79.5–101.0)
MONO%: 5.7 % (ref 0.0–14.0)
RBC: 3.2 10*6/uL — ABNORMAL LOW (ref 3.70–5.45)
RDW: 16.8 % — ABNORMAL HIGH (ref 11.2–14.5)
nRBC: 0 % (ref 0–0)

## 2011-08-25 LAB — COMPREHENSIVE METABOLIC PANEL
ALT: 24 U/L (ref 0–35)
AST: 19 U/L (ref 0–37)
CO2: 29 mEq/L (ref 19–32)
Sodium: 138 mEq/L (ref 135–145)
Total Bilirubin: 0.3 mg/dL (ref 0.3–1.2)
Total Protein: 7.7 g/dL (ref 6.0–8.3)

## 2011-08-25 LAB — FERRITIN: Ferritin: 1435 ng/mL — ABNORMAL HIGH (ref 10–291)

## 2011-08-25 MED ORDER — ONDANSETRON 8 MG/50ML IVPB (CHCC)
8.0000 mg | Freq: Once | INTRAVENOUS | Status: AC
  Administered 2011-08-25: 8 mg via INTRAVENOUS

## 2011-08-25 MED ORDER — FAMOTIDINE IN NACL 20-0.9 MG/50ML-% IV SOLN
20.0000 mg | Freq: Once | INTRAVENOUS | Status: AC
  Administered 2011-08-25: 20 mg via INTRAVENOUS

## 2011-08-25 MED ORDER — PACLITAXEL CHEMO INJECTION 300 MG/50ML
80.0000 mg/m2 | Freq: Once | INTRAVENOUS | Status: AC
  Administered 2011-08-25: 204 mg via INTRAVENOUS
  Filled 2011-08-25: qty 34

## 2011-08-25 MED ORDER — DEXAMETHASONE SODIUM PHOSPHATE 4 MG/ML IJ SOLN
4.0000 mg | Freq: Once | INTRAMUSCULAR | Status: AC
  Administered 2011-08-25: 4 mg via INTRAVENOUS

## 2011-08-25 MED ORDER — SODIUM CHLORIDE 0.9 % IV SOLN
Freq: Once | INTRAVENOUS | Status: AC
  Administered 2011-08-25: 100 mL via INTRAVENOUS

## 2011-08-25 MED ORDER — DIPHENHYDRAMINE HCL 50 MG/ML IJ SOLN
25.0000 mg | Freq: Once | INTRAMUSCULAR | Status: AC
  Administered 2011-08-25: 25 mg via INTRAVENOUS

## 2011-08-25 NOTE — Progress Notes (Signed)
ID: [name]  HPI:  36 year old [place] woman who developed pain in her right breast and brought it to her primary care physician's attention. Mammography at [facility] 03/10/2011 found the breasts to be heterogeneously dense. There was a mildly irregular mass posteriorly in the upper outer quadrant of the right breast. On ultrasound this was hypoechoic and measured approximately 2.4 cm, with irregular margins. The left breast was unremarkable.  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 Bilateral breast MRIs were obtained 03/18/2011, showing in the upper outer quadrant of the right breast an irregular mass measuring 5.5 cm. There was a suggestion of cortical thickening in the level I right axillary lymph node, which measured 1.2 cm. The patient was staged with chest CT scan and PET scan, which showed no distant disease. These studies did confirm the abnormality in the right breast as well as a hypermetabolic right axillary lymph node, making this a clinical T3 N1 or stage III invasive ductal carcinoma. The patient was presented at the multidisciplinary breast cancer clinic and it was felt neoadjuvant chemotherapy would be in her best interest, to be followed by surgery, then radiation, then anti-estrogen therapy.  Interval History:  Patient returns today for followup of her locally advanced right breast carcinoma. She is being treated in the neoadjuvant setting and is due for her 8th weekly dose of paclitaxel today.   We have been following the patient very closely for some early signs of peripheral neuropathy. She continues to have neuropathy, primarily tingling in the soles of her feet bilaterally. She notes that this has not changed whatsoever in the last couple of weeks. In fact she still believes it is better today than it  was a few weeks ago with Dr. Darnelle Catalan held one weekly dose. Is not affecting any of her day-to-day activities. It is a little comfortable at night and she is trying to go to sleep, but she tells me it is not interrupting her sleep pattern. She's had no signs of peripheral neuropathy in the upper extremities.  [name] did have some increased nausea with this last dose. She persistently finds that the nausea increases on day 4 (usually Sunday) and she often has one episode of emesis on day 4 as well. She is using either metoclopramide her prochlorperazine on the evening of chemotherapy in the next morning. She's not been taking any additional antinausea medication. She's had some frequent bowel movements but no diarrhea. She continues to have quite a bit of fatigue, with shortness of breath associated with exertion. No orthopnea. No peripheral swelling. No abnormal bleeding. She has occasional headaches. She continues to have some back pain and joint pain, for which she utilizes Percocet appropriately and affectively. This has not worsened.  A detailed review of systems is otherwise noncontributory as noted below.  Review of Systems: Constitutional: fatigue,  no weight loss, fever, night sweats Eyes: negative [id number] Cardiovascular: no chest pain, positive for dyspnea on exertion Respiratory: shortness of breath with exertion, no orthopnea, no cough or wheezing Neurological: numbness/tingling in lower extremities, but stable Dermatological: negative Gastrointestinal: occasional abdominal pain and nausea, no change in bowel habits, or black or bloody stools Genito-Urinary: no dysuria, trouble voiding, or hematuria Hematological and Lymphatic: negative Breast: negative Musculoskeletal: joint pain and muscle pain Remaining ROS negative.   PAST MEDICAL HISTORY:  Significant for hypertension, anemia, obesity,  remote history of migraine (in childhood) and GERD.  The patient has had both her hips  pinned because of falls at age 63.  FAMILY HISTORY:  [name] is alive at age 49.  The patient's father died from a myocardial infarction at the age of 55.  She has one sister and one brother.  There is no history of breast or ovarian cancer in the family.  GYNECOLOGIC HISTORY:  Menarche age 48.  Most recent period July 8.  Her periods usually last about 4 days of which the first 2 are heavy.  She has never used birth control pills.  She is GX P2.  First pregnancy to term age 71.  She also had a miscarriage.  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  Her husband of 3 years, [name], is present today.  He works in Holiday representative.  Daughter, [name], 15, and [name], 12, both live at home with the patient and her husband as does the patient's mother.  Medications: I have reviewed the patient's current medications.  Current Outpatient Prescriptions  Medication Sig Dispense Refill  . fluconazole (DIFLUCAN) 100 MG tablet Take 100 mg by mouth daily.        Marland Kitchen LORazepam (ATIVAN) 0.5 MG tablet Take 0.5 mg by mouth 2 (two) times daily as needed.        . metoCLOPramide (REGLAN) 10 MG tablet Take 10 mg by mouth 4 (four) times daily as needed.        Marland Kitchen olmesartan-hydrochlorothiazide (BENICAR HCT) 40-25 MG per tablet Take 1 tablet by mouth 2 (two) times daily.       Marland Kitchen oxyCODONE-acetaminophen (PERCOCET) 5-325 MG per tablet Take 1 tablet by mouth every 12 (twelve) hours as needed.  64 tablet  0  . pantoprazole (PROTONIX) 40 MG tablet Take 40 mg by mouth daily.        . prochlorperazine (COMPAZINE) 10 MG tablet Take 10 mg by mouth every 6 (six) hours as needed.        . ranitidine (ZANTAC) 15 MG/ML syrup Take 50 mg by mouth 2 (two) times daily.        Marland Kitchen senna-docusate (SENOKOT-S) 8.6-50 MG per tablet Take 1-2 tablets by mouth at bedtime.  20 tablet  0   No current facility-administered medications for this visit.   Facility-Administered Medications Ordered in  Other Visits  Medication Dose Route Frequency Provider Last Rate Last Dose  . 0.9 %  sodium chloride infusion   Intravenous Once Zollie Scale, PA         Objective:   Physical Exam: Filed Vitals:   08/25/11 1010  BP: 108/76  Pulse: 118  Temp: 98.3 F (36.8 C)   HEENT:  Sclerae anicteric, conjunctivae pale.  Oropharynx clear.  No mucositis or candidiasis.   Nodes:  No cervical, supraclavicular, or axillary lymphadenopathy palpated.  Breast Exam: Right breast, unable to palpate a distinct mass in the right breast today. No skin changes or nipple inversion noted. Left breast is benign, no masses, discharge, and nipple inversion, or skin changes. Port is intact in the left upper chest wall with no erythema, edema, or evidence of infection. Lungs:  Clear to auscultation bilaterally.  No crackles, rhonchi, or wheezes.   Heart:  Regular rate and rhythm.   Abdomen:  Soft, obese, nontender.  Positive bowel sounds.   Musculoskeletal:  No focal spinal tenderness to palpation.  Extremities:  Benign.  No peripheral edema or cyanosis.  Skin:  Benign.   Neuro:  Nonfocal.  Lab Results:   CMP    Chemistry      Component Value Date/Time   NA 137 08/11/2011 1133   K 3.5 08/11/2011 1133   CL 101 08/11/2011 1133   CO2 30 08/11/2011 1133   BUN 13 08/11/2011 1133   CREATININE 0.67 08/11/2011 1133      Component Value Date/Time   CALCIUM 9.9 08/11/2011 1133   ALKPHOS 98 08/11/2011 1133   AST 30 08/11/2011 1133   ALT 35 08/11/2011 1133   BILITOT 0.5 08/11/2011 1133       CBC Lab Results  Component Value Date   WBC 4.8 08/25/2011   HGB 8.6* 08/25/2011   HCT 27.4* 08/25/2011   MCV 85.6 08/25/2011   PLT 367 08/25/2011   NEUTROABS 3.2 08/25/2011    Studies/Results  *RADIOLOGY REPORT*  Jun 14, 2011 Clinical Data: Invasive ductal carcinoma of the right breast.  Follow-up evaluation while on neoadjuvant therapy.  BILATERAL BREAST MRI WITH AND WITHOUT CONTRAST  Technique: Multiplanar,  multisequence MR images of both breasts  were obtained prior to and following the intravenous administration  of 20ml of Multihance. Three dimensional images were evaluated at  the independent DynaCad workstation.  Comparison: 03/18/2011.  Findings: There is a biopsy-proven enhancing malignancy within the  upper-outer quadrant of the right breast posteriorly located. The  intensity of enhancement has decreased as has the size of the mass.  The mass previously measured 5.5 x 2.6 x 3.7 cm in size and now  measures 3.7 x 2.0 x 1.9 cm in size. Also, the previously  described mildly prominent level I right axillary lymph node has  decreased in size and now measures 7 mm in greatest dimension  (previously 1.2 cm in greatest dimension). There are no new masses  and the remainder of the study is unchanged.  IMPRESSION:  Decrease in size and intensity of enhancement associated with the  biopsy proven malignancy located within the upper-outer quadrant of  the right breast. The mass now measures 3.7 x 2.0 x 1.9 cm in  size.  THREE-DIMENSIONAL MR IMAGE RENDERING ON INDEPENDENT WORKSTATION:  Three-dimensional MR images were rendered by post-processing of the  original MR data on an independent workstation. The three-  dimensional MR images were interpreted, and findings were reported  in the accompanying complete MRI report for this study.  BI-RADS CATEGORY 6: Known biopsy-proven malignancy - appropriate  action should be taken.  Original Report Authenticated By: Rolla Plate, M.D.      Assessment: 36 year old [place] woman status post right breast biopsy July of 2012 for a clinical T3 N1 (stage III) invasive ductal carcinoma, grade 3, strongly estrogen and progesterone receptor positive, HER-2 negative, with an MIB-1-1 of 98%.  (1) Status post 4 cycles of dose dense doxorubicin and cyclophosphamide, followed by weekly paclitaxel x 12  (2) Iron deficiency anemia, treated with  Fereheme  (3) Hypertension, poorly controlled.   Plan: The patient will proceed to treatment today as scheduled for her 8th weekly dose of paclitaxel. We'll continue to follow her very closely for any signs of increased peripheral neuropathy, which at this point appears to be mild and stable. In fact he'll continue seeing her on a weekly basis with each scheduled dose of paclitaxel.  We are rechecking Lavender's ferritin level today. It is likely we will need to add fereheme to her treatment plan over the next couple of weeks for her increasingly symptomatic anemia. I have asked  her to contact us if her shortness of breath or fatigue worsens between now and her appointment next week on January 10. We certainly can consider blood transfusion if needed.  This plan was reviewed with the patient who voices her understanding and agreement. She knows as always to call with any changes or problems.   Artie Takayama, PA-C 08/25/2011

## 2011-08-26 ENCOUNTER — Other Ambulatory Visit: Payer: Self-pay | Admitting: Certified Registered Nurse Anesthetist

## 2011-08-29 ENCOUNTER — Other Ambulatory Visit: Payer: Self-pay | Admitting: *Deleted

## 2011-08-31 ENCOUNTER — Ambulatory Visit (HOSPITAL_BASED_OUTPATIENT_CLINIC_OR_DEPARTMENT_OTHER): Payer: Medicaid Other

## 2011-08-31 ENCOUNTER — Encounter: Payer: Self-pay | Admitting: Physician Assistant

## 2011-08-31 ENCOUNTER — Ambulatory Visit (HOSPITAL_BASED_OUTPATIENT_CLINIC_OR_DEPARTMENT_OTHER): Payer: Medicaid Other | Admitting: Physician Assistant

## 2011-08-31 ENCOUNTER — Other Ambulatory Visit: Payer: Self-pay | Admitting: Physician Assistant

## 2011-08-31 ENCOUNTER — Other Ambulatory Visit (HOSPITAL_BASED_OUTPATIENT_CLINIC_OR_DEPARTMENT_OTHER): Payer: Medicaid Other | Admitting: Lab

## 2011-08-31 VITALS — BP 104/69 | HR 111 | Temp 97.2°F | Ht 67.0 in | Wt 307.8 lb

## 2011-08-31 DIAGNOSIS — Z170 Estrogen receptor positive status [ER+]: Secondary | ICD-10-CM

## 2011-08-31 DIAGNOSIS — C50919 Malignant neoplasm of unspecified site of unspecified female breast: Secondary | ICD-10-CM

## 2011-08-31 DIAGNOSIS — D509 Iron deficiency anemia, unspecified: Secondary | ICD-10-CM

## 2011-08-31 DIAGNOSIS — I10 Essential (primary) hypertension: Secondary | ICD-10-CM

## 2011-08-31 DIAGNOSIS — E876 Hypokalemia: Secondary | ICD-10-CM | POA: Insufficient documentation

## 2011-08-31 LAB — COMPREHENSIVE METABOLIC PANEL
ALT: 30 U/L (ref 0–35)
AST: 28 U/L (ref 0–37)
Alkaline Phosphatase: 86 U/L (ref 39–117)
Sodium: 140 mEq/L (ref 135–145)
Total Bilirubin: 0.4 mg/dL (ref 0.3–1.2)
Total Protein: 7.7 g/dL (ref 6.0–8.3)

## 2011-08-31 LAB — CBC WITH DIFFERENTIAL/PLATELET
BASO%: 0.7 % (ref 0.0–2.0)
EOS%: 2.5 % (ref 0.0–7.0)
LYMPH%: 20.1 % (ref 14.0–49.7)
MCH: 28.3 pg (ref 25.1–34.0)
MCHC: 32.3 g/dL (ref 31.5–36.0)
MCV: 87.6 fL (ref 79.5–101.0)
MONO#: 0.3 10*3/uL (ref 0.1–0.9)
MONO%: 6.9 % (ref 0.0–14.0)
Platelets: 417 10*3/uL — ABNORMAL HIGH (ref 145–400)
RBC: 3 10*6/uL — ABNORMAL LOW (ref 3.70–5.45)
WBC: 3.7 10*3/uL — ABNORMAL LOW (ref 3.9–10.3)

## 2011-08-31 MED ORDER — HYDROCODONE-ACETAMINOPHEN 5-500 MG PO CAPS: 1.0000 | ORAL_CAPSULE | Freq: Four times a day (QID) | ORAL | Status: AC | PRN

## 2011-08-31 MED ORDER — POTASSIUM CHLORIDE CRYS ER 20 MEQ PO TBCR: 20.0000 meq | EXTENDED_RELEASE_TABLET | Freq: Two times a day (BID) | ORAL | Status: DC

## 2011-08-31 NOTE — Progress Notes (Signed)
ID: [name]  HPI:  36 year old [place] woman who developed pain in her right breast and brought it to her primary care physician's attention. Mammography at [facility] 03/10/2011 found the breasts to be heterogeneously dense. There was a mildly irregular mass posteriorly in the upper outer quadrant of the right breast. On ultrasound this was hypoechoic and measured approximately 2.4 cm, with irregular margins. The left breast was unremarkable.  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 Bilateral breast MRIs were obtained 03/18/2011, showing in the upper outer quadrant of the right breast an irregular mass measuring 5.5 cm. There was a suggestion of cortical thickening in the level I right axillary lymph node, which measured 1.2 cm. The patient was staged with chest CT scan and PET scan, which showed no distant disease. These studies did confirm the abnormality in the right breast as well as a hypermetabolic right axillary lymph node, making this a clinical T3 N1 or stage III invasive ductal carcinoma. The patient was presented at the multidisciplinary breast cancer clinic and it was felt neoadjuvant chemotherapy would be in her best interest, to be followed by surgery, then radiation, then anti-estrogen therapy.  Interval History:  Patient returns today for followup of her locally advanced right breast carcinoma. She is being treated in the neoadjuvant setting and is due for her 9th weekly dose of paclitaxel today.   We have been following the patient very closely for some early signs of peripheral neuropathy.  Unfortunately, since her eighth dose of paclitaxel last week, [name]'s neuropathy does seem to have increased significantly. Today, her upper chamber these appear to be spared. On her lower extremities, however, she tells  me that her feet burn or feel cold all of the time. The numbness actually extends up into the lower leg at night. Again this is a significant increase compared to last week, when the neuropathy was mild and unchanged.   [name] has several other problems as well today. She continues to have problems with anemia. A recent ferritin level, however, was high at 1435. His hemoglobin continues to drop slowly, down to 8.5 today. [name] does admit to having some rectal bleeding this past week. She was having increased frequency with bowel movements, but denies either diarrhea or constipation. She had small amounts of blood in the toilet water with each bowel movement up until 2 days ago. Was also painful to have bowel movements, and from her description it sounds like a rectal fissure. She denies any known hemorrhoids. Again, the bleeding has now resolved. She denies any dark or tarry stools, and no evidence of blood in the stool itself. She's had no additional abnormal bleeding.  [name]'s anemia has become even more symptomatic. She is extremely weak and tired. Her legs feel "heavy". She has shortness of breath with even minor exertion. She denies any orthopnea. She's had no peripheral swelling.  Finally, [name] has continued to have headaches, and notes that these have increased since having her head on her car trunk in December.  She tells me the trunk lid closed, hitting her in the top of the head. She denies any lacerations or bleeding. No bumps noted on the head. She has, however, noted increased headaches with the last few weeks, although I was unaware of this incident.   A detailed review of systems is otherwise noncontributory as noted  below.  Review of Systems: Constitutional: fatigue, general weakness, no weight loss, fever, night sweats Eyes: occasional blurred vision [id number] Cardiovascular: no chest pain, positive for dyspnea on exertion Respiratory: shortness of breath with exertion, no  orthopnea, no cough or wheezing Neurological: increased headaches, numbness/tingling in lower extremities, worsened Dermatological: negative Gastrointestinal: recent rectal bleeding, resolved;  occasional abdominal pain and nausea, no change in bowel habits, or black or bloody stools Genito-Urinary: no dysuria, trouble voiding, or hematuria Hematological and Lymphatic: negative Breast: negative Musculoskeletal: joint pain and muscle pain Remaining ROS negative.   PAST MEDICAL HISTORY:  Significant for hypertension, anemia, obesity, remote history of migraine (in childhood) and GERD.  The patient has had both her hips pinned because of falls at age 24.  FAMILY HISTORY:  [name] is alive at age 42.  The patient's father died from a myocardial infarction at the age of 93.  She has one sister and one brother.  There is no history of breast or ovarian cancer in the family.  GYNECOLOGIC HISTORY:  Menarche age 36.  Most recent period July 8.  Her periods usually last about 4 days of which the first 2 are heavy.  She has never used birth control pills.  She is GX P2.  First pregnancy to term age 89.  She also had a miscarriage.  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  Her husband of 3 years, [name], is present today.  He works in Holiday representative.  Daughter, [name], 15, and [name], 12, both live at home with the patient and her husband as does the patient's mother.  Medications: I have reviewed the patient's current medications.  Current Outpatient Prescriptions  Medication Sig Dispense Refill  . fluconazole (DIFLUCAN) 100 MG tablet Take 100 mg by mouth daily.        . hydrocodone-acetaminophen (LORCET-HD) 5-500 MG per capsule Take 1 capsule by mouth every 6 (six) hours as needed for pain.  30 capsule  0  . LORazepam (ATIVAN) 0.5 MG tablet Take 0.5 mg by mouth 2 (two) times daily as needed.        . metoCLOPramide (REGLAN) 10 MG tablet Take 10 mg  by mouth 4 (four) times daily as needed.        Marland Kitchen olmesartan-hydrochlorothiazide (BENICAR HCT) 40-25 MG per tablet Take 1 tablet by mouth 2 (two) times daily.       . pantoprazole (PROTONIX) 40 MG tablet Take 40 mg by mouth daily.        . potassium chloride SA (K-DUR,KLOR-CON) 20 MEQ tablet Take 1 tablet (20 mEq total) by mouth 2 (two) times daily.  60 tablet  1  . prochlorperazine (COMPAZINE) 10 MG tablet Take 10 mg by mouth every 6 (six) hours as needed.        . ranitidine (ZANTAC) 15 MG/ML syrup Take 50 mg by mouth 2 (two) times daily.        Marland Kitchen senna-docusate (SENOKOT-S) 8.6-50 MG per tablet Take 1-2 tablets by mouth at bedtime.  20 tablet  0   No current facility-administered medications for this visit.   Facility-Administered Medications Ordered in Other Visits  Medication Dose Route Frequency Provider Last Rate Last Dose  . 0.9 %  sodium chloride infusion   Intravenous Once Zollie Scale, PA         Objective:   Physical Exam: Filed Vitals:   08/31/11 1434  BP: 104/69  Pulse: 111  Temp: 97.2 F (36.2 C)  HEENT:  Sclerae anicteric, conjunctivae pale.  Oropharynx clear.  No mucositis or candidiasis.   Nodes:  No cervical, supraclavicular, or axillary lymphadenopathy palpated.  Breast Exam: Deferred  Lungs:  Clear to auscultation bilaterally.  No crackles, rhonchi, or wheezes.   Heart:  Regular rhythm. slightly tachycardic. No gallops, murmurs, or rubs.   Abdomen:  Soft, obese, nontender.  Positive bowel sounds.   Musculoskeletal:  Slight  focal spinal tenderness to palpation in the cervical region.  Extremities:  Benign.  No peripheral edema or cyanosis.   Skin:  Benign with the exception of hyperpigmentation bilaterally in the upper extremities, including the nailbeds. Is   Neuro:  Nonfocal.  Lab Results:   CMP    Chemistry      Component Value Date/Time   NA 140 08/31/2011 1422   K 3.4* 08/31/2011 1422   CL 102 08/31/2011 1422   CO2 29 08/31/2011 1422   BUN 12 08/31/2011  1422   CREATININE 0.81 08/31/2011 1422      Component Value Date/Time   CALCIUM 10.1 08/31/2011 1422   ALKPHOS 86 08/31/2011 1422   AST 28 08/31/2011 1422   ALT 30 08/31/2011 1422   BILITOT 0.4 08/31/2011 1422       CBC Lab Results  Component Value Date   WBC 3.7* 08/31/2011   HGB 8.5* 08/31/2011   HCT 26.3* 08/31/2011   MCV 87.6 08/31/2011   PLT 417* 08/31/2011   NEUTROABS 2.6 08/31/2011    Studies/Results  *RADIOLOGY REPORT*  Jun 14, 2011 Clinical Data: Invasive ductal carcinoma of the right breast.  Follow-up evaluation while on neoadjuvant therapy.  BILATERAL BREAST MRI WITH AND WITHOUT CONTRAST  Technique: Multiplanar, multisequence MR images of both breasts  were obtained prior to and following the intravenous administration  of 20ml of Multihance. Three dimensional images were evaluated at  the independent DynaCad workstation.  Comparison: 03/18/2011.  Findings: There is a biopsy-proven enhancing malignancy within the  upper-outer quadrant of the right breast posteriorly located. The  intensity of enhancement has decreased as has the size of the mass.  The mass previously measured 5.5 x 2.6 x 3.7 cm in size and now  measures 3.7 x 2.0 x 1.9 cm in size. Also, the previously  described mildly prominent level I right axillary lymph node has  decreased in size and now measures 7 mm in greatest dimension  (previously 1.2 cm in greatest dimension). There are no new masses  and the remainder of the study is unchanged.  IMPRESSION:  Decrease in size and intensity of enhancement associated with the  biopsy proven malignancy located within the upper-outer quadrant of  the right breast. The mass now measures 3.7 x 2.0 x 1.9 cm in  size.  THREE-DIMENSIONAL MR IMAGE RENDERING ON INDEPENDENT WORKSTATION:  Three-dimensional MR images were rendered by post-processing of the  original MR data on an independent workstation. The three-  dimensional MR images were interpreted, and findings were  reported  in the accompanying complete MRI report for this study.  BI-RADS CATEGORY 6: Known biopsy-proven malignancy - appropriate  action should be taken.  Original Report Authenticated By: Rolla Plate, M.D.      Assessment: 36 year old [place] woman status post right breast biopsy July of 2012 for a clinical T3 N1 (stage III) invasive ductal carcinoma, grade 3, strongly estrogen and progesterone receptor positive, HER-2 negative, with an MIB-1-1 of 98%.  (1) Status post 4 cycles of dose dense doxorubicin and cyclophosphamide, followed by weekly paclitaxel x 12  (  2) Iron deficiency anemia, treated with Fereheme  (3) Hypertension, poorly controlled.   Plan: This case was reviewed with Dr. Darnelle Catalan today. We both agree that it would be best to hold Allanah's paclitaxel, and reassess her increased peripheral neuropathy when she returns next week to see me on January 16.  With regard to the anemia, we're sending Trevia back to the lab today to check reticulocyte count, folate, and vitamin B12. We will recheck her CBC when she returns next week on the 16th and we'll reassess at that time. I have asked her to contact her primary care physician if she has any additional rectal bleeding. I do not believe this is going to be directly associated, however, with her breast cancer diagnosis or treatment.  Regards to the headaches and history of recent head injury, we are ordering a noncontrast head CT to evaluate for any subdural bleed. Helmi does believe taking oxycodone (she takes this for leg pain and body aches) actually increases her headaches. She would like to try hydrocodone rather than refill the oxycodone today.  I will also mention that Imara's potassium level was low at 3.0 last week. Recall that she is on Benicar twice daily for hypertension. We will start her on potassium, 20 mEq by mouth twice a day, and she was given this prescription today as well. We are rechecking her seem that  today.  This plan was reviewed with the patient who voices her understanding and agreement. She knows as always to call with any changes or problems.   Loredana Medellin, PA-C 08/31/2011

## 2011-09-01 ENCOUNTER — Ambulatory Visit: Payer: Medicaid Other

## 2011-09-01 ENCOUNTER — Other Ambulatory Visit: Payer: Medicaid Other | Admitting: Lab

## 2011-09-05 ENCOUNTER — Ambulatory Visit (HOSPITAL_COMMUNITY)
Admission: RE | Admit: 2011-09-05 | Discharge: 2011-09-05 | Disposition: A | Discharge status: Home / Self Care | Payer: Medicaid Other | Source: Ambulatory Visit | Attending: Physician Assistant | Admitting: Physician Assistant

## 2011-09-05 DIAGNOSIS — R51 Headache: Secondary | ICD-10-CM | POA: Insufficient documentation

## 2011-09-05 DIAGNOSIS — C50919 Malignant neoplasm of unspecified site of unspecified female breast: Secondary | ICD-10-CM

## 2011-09-07 ENCOUNTER — Encounter: Payer: Self-pay | Admitting: Physician Assistant

## 2011-09-07 ENCOUNTER — Ambulatory Visit (HOSPITAL_BASED_OUTPATIENT_CLINIC_OR_DEPARTMENT_OTHER): Payer: Medicaid Other | Admitting: Physician Assistant

## 2011-09-07 ENCOUNTER — Other Ambulatory Visit: Payer: Medicaid Other | Admitting: Lab

## 2011-09-07 ENCOUNTER — Encounter (HOSPITAL_COMMUNITY)
Admission: RE | Admit: 2011-09-07 | Discharge: 2011-09-07 | Disposition: A | Discharge status: Home / Self Care | Payer: Medicaid Other | Source: Ambulatory Visit | Attending: Oncology | Admitting: Oncology

## 2011-09-07 VITALS — BP 127/87 | HR 108 | Temp 98.3°F | Ht 67.0 in | Wt 312.9 lb

## 2011-09-07 DIAGNOSIS — D509 Iron deficiency anemia, unspecified: Secondary | ICD-10-CM

## 2011-09-07 DIAGNOSIS — C50919 Malignant neoplasm of unspecified site of unspecified female breast: Secondary | ICD-10-CM

## 2011-09-07 DIAGNOSIS — I10 Essential (primary) hypertension: Secondary | ICD-10-CM

## 2011-09-07 LAB — COMPREHENSIVE METABOLIC PANEL
ALT: 19 U/L (ref 0–35)
AST: 18 U/L (ref 0–37)
Albumin: 3.7 g/dL (ref 3.5–5.2)
Alkaline Phosphatase: 100 U/L (ref 39–117)
BUN: 15 mg/dL (ref 6–23)
CO2: 27 mEq/L (ref 19–32)
Calcium: 9.9 mg/dL (ref 8.4–10.5)
Chloride: 107 mEq/L (ref 96–112)
Creatinine, Ser: 0.86 mg/dL (ref 0.50–1.10)
Glucose, Bld: 136 mg/dL — ABNORMAL HIGH (ref 70–99)
Potassium: 3.9 mEq/L (ref 3.5–5.3)
Sodium: 143 mEq/L (ref 135–145)
Total Bilirubin: 0.2 mg/dL — ABNORMAL LOW (ref 0.3–1.2)
Total Protein: 7.4 g/dL (ref 6.0–8.3)

## 2011-09-07 LAB — CBC WITH DIFFERENTIAL/PLATELET
BASO%: 0.4 % (ref 0.0–2.0)
EOS%: 5.8 % (ref 0.0–7.0)
HCT: 26.5 % — ABNORMAL LOW (ref 34.8–46.6)
MCH: 28.4 pg (ref 25.1–34.0)
MCHC: 31.9 g/dL (ref 31.5–36.0)
NEUT#: 3.4 10*3/uL (ref 1.5–6.5)
RDW: 17.2 % — ABNORMAL HIGH (ref 11.2–14.5)
lymph#: 0.8 10*3/uL — ABNORMAL LOW (ref 0.9–3.3)

## 2011-09-07 LAB — TYPE AND SCREEN
ABO/RH(D): O POS
Unit division: 0

## 2011-09-07 LAB — LACTATE DEHYDROGENASE: LDH: 195 U/L (ref 94–250)

## 2011-09-07 MED ORDER — DIPHENHYDRAMINE HCL 25 MG PO CAPS: 25.0000 mg | ORAL_CAPSULE | Freq: Once | ORAL | Status: DC

## 2011-09-07 NOTE — Progress Notes (Signed)
ID: [name]  HPI:  36 year old [place] woman who developed pain in her right breast and brought it to her primary care physician's attention. Mammography at [facility] 03/10/2011 found the breasts to be heterogeneously dense. There was a mildly irregular mass posteriorly in the upper outer quadrant of the right breast. On ultrasound this was hypoechoic and measured approximately 2.4 cm, with irregular margins. The left breast was unremarkable.  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 Bilateral breast MRIs were obtained 03/18/2011, showing in the upper outer quadrant of the right breast an irregular mass measuring 5.5 cm. There was a suggestion of cortical thickening in the level I right axillary lymph node, which measured 1.2 cm. The patient was staged with chest CT scan and PET scan, which showed no distant disease. These studies did confirm the abnormality in the right breast as well as a hypermetabolic right axillary lymph node, making this a clinical T3 N1 or stage III invasive ductal carcinoma.   The patient was presented at the multidisciplinary breast cancer clinic and it was felt neoadjuvant chemotherapy would be in her best interest, to be followed by surgery, then radiation, then anti-estrogen therapy. Currently receiving neoadjuvant chemotherapy, the goal being to complete 4 cycles of dose dense doxorubicin cyclophosphamide, followed by 12 weekly doses of paclitaxel.    Interval History:  [name] returns today for followup of her locally advanced right breast carcinoma, due for her ninth weekly dose of paclitaxel today, being given in the neoadjuvant setting. Her chemotherapy was held last week due to the development of increased peripheral neuropathy. This has not improved at all, and is stable compared to last  week. The neuropathy is primarily affecting her lower extremities, causing discomfort and burning in the feet, and numbness at night extending up into the lower leg. She's had no increased peripheral swelling.  [name] continues to have headaches a regular basis. We did obtain a head CT last week which was normal. She feels a little dizzy at times. She denies any dizziness associated with the headaches, however. She's had no change in vision. No gait disturbance. No nausea or emesis.  [name] still extremely fatigued. She has shortness of breath with minor exertion. She continues to have some occasional nosebleed but has had no additional abnormal bleeding elsewhere.  A detailed review of systems is otherwise noncontributory as noted below.  Review of Systems: Constitutional: fatigue, general weakness, no weight loss, fever, night sweats Eyes: negative [id number] Cardiovascular: no chest pain, positive for dyspnea on exertion Respiratory: shortness of breath with exertion, no orthopnea, no cough or wheezing Neurological:  headaches, numbness/tingling in lower extremities Dermatological: negative Gastrointestinal: , no change in bowel habits, nausea, or black or bloody stools Genito-Urinary: no dysuria, trouble voiding, or hematuria Hematological and Lymphatic: negative Breast: negative Musculoskeletal: joint pain and muscle pain Remaining ROS negative.   PAST MEDICAL HISTORY:  Significant for hypertension, anemia, obesity, remote history of migraine (in childhood) and GERD.  The patient has had both her hips pinned because of falls at age 26.  FAMILY HISTORY:  [name] is alive at age 14.  The patient's father died from a myocardial infarction at the age of 41.  She has one sister and one brother.  There is no history of breast or ovarian cancer in the family.  GYNECOLOGIC HISTORY:  Menarche age  11.  Most recent period July 8.  Her periods usually last about 4 days of which the  first 2 are heavy.  She has never used birth control pills.  She is GX P2.  First pregnancy to term age 51.  She also had a miscarriage.  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  Her husband of 3 years, Mintie Witherington, is present today.  He works in Holiday representative.  Daughter, [name], 15, and [name], 12, both live at home with the patient and her husband as does the patient's mother.  Medications: I have reviewed the patient's current medications.  Current Outpatient Prescriptions  Medication Sig Dispense Refill  . fluconazole (DIFLUCAN) 100 MG tablet Take 100 mg by mouth daily.        . hydrocodone-acetaminophen (LORCET-HD) 5-500 MG per capsule Take 1 capsule by mouth every 6 (six) hours as needed for pain.  30 capsule  0  . LORazepam (ATIVAN) 0.5 MG tablet Take 0.5 mg by mouth 2 (two) times daily as needed.        . metoCLOPramide (REGLAN) 10 MG tablet Take 10 mg by mouth 4 (four) times daily as needed.        Marland Kitchen olmesartan-hydrochlorothiazide (BENICAR HCT) 40-25 MG per tablet Take 1 tablet by mouth 2 (two) times daily.       . pantoprazole (PROTONIX) 40 MG tablet Take 40 mg by mouth daily.        . potassium chloride SA (K-DUR,KLOR-CON) 20 MEQ tablet Take 1 tablet (20 mEq total) by mouth 2 (two) times daily.  60 tablet  1  . prochlorperazine (COMPAZINE) 10 MG tablet Take 10 mg by mouth every 6 (six) hours as needed.        . ranitidine (ZANTAC) 15 MG/ML syrup Take 50 mg by mouth 2 (two) times daily.        Marland Kitchen senna-docusate (SENOKOT-S) 8.6-50 MG per tablet Take 1-2 tablets by mouth at bedtime.  20 tablet  0   Current Facility-Administered Medications  Medication Dose Route Frequency Provider Last Rate Last Dose  . diphenhydrAMINE (BENADRYL) capsule 25 mg  25 mg Oral Once Zollie Scale, PA       Facility-Administered Medications Ordered in Other Visits  Medication Dose Route Frequency Provider Last Rate Last Dose  . 0.9 %  sodium chloride infusion   Intravenous  Once Zollie Scale, PA         Objective:   Physical Exam: Filed Vitals:   09/07/11 1136  BP: 127/87  Pulse: 108  Temp: 98.3 F (36.8 C)   HEENT:  Sclerae anicteric, conjunctivae pale.  Oropharynx clear.  No mucositis or candidiasis.   Nodes:  No cervical, supraclavicular, or axillary lymphadenopathy palpated.  Breast Exam: Deferred  Lungs:  Clear to auscultation bilaterally.  No crackles, rhonchi, or wheezes.   Heart:  Regular rate and  rhythm.  No gallops, murmurs, or rubs.   Abdomen:  Soft, obese, nontender.  Positive bowel sounds.   Musculoskeletal:  Slight  focal spinal tenderness to palpation in the cervical region.  Extremities:  Benign.  No peripheral edema or cyanosis.   Skin:  Benign with the exception of hyperpigmentation bilaterally in the upper extremities, including the nailbeds.   Neuro:  Nonfocal.  Lab Results:   CMP    Chemistry      Component Value Date/Time   NA 143 09/07/2011 1124   K 3.9 09/07/2011 1124   CL 107 09/07/2011 1124  CO2 27 09/07/2011 1124   BUN 15 09/07/2011 1124   CREATININE 0.86 09/07/2011 1124      Component Value Date/Time   CALCIUM 9.9 09/07/2011 1124   ALKPHOS 100 09/07/2011 1124   AST 18 09/07/2011 1124   ALT 19 09/07/2011 1124   BILITOT 0.2* 09/07/2011 1124       CBC Lab Results  Component Value Date   WBC 5.1 09/07/2011   HGB 8.5* 09/07/2011   HCT 26.5* 09/07/2011   MCV 88.8 09/07/2011   PLT 357 09/07/2011   NEUTROABS 3.4 09/07/2011    Studies/Results  *RADIOLOGY REPORT*  Jun 14, 2011 Clinical Data: Invasive ductal carcinoma of the right breast.  Follow-up evaluation while on neoadjuvant therapy.  BILATERAL BREAST MRI WITH AND WITHOUT CONTRAST  Technique: Multiplanar, multisequence MR images of both breasts  were obtained prior to and following the intravenous administration  of 20ml of Multihance. Three dimensional images were evaluated at  the independent DynaCad workstation.  Comparison: 03/18/2011.  Findings: There is a  biopsy-proven enhancing malignancy within the  upper-outer quadrant of the right breast posteriorly located. The  intensity of enhancement has decreased as has the size of the mass.  The mass previously measured 5.5 x 2.6 x 3.7 cm in size and now  measures 3.7 x 2.0 x 1.9 cm in size. Also, the previously  described mildly prominent level I right axillary lymph node has  decreased in size and now measures 7 mm in greatest dimension  (previously 1.2 cm in greatest dimension). There are no new masses  and the remainder of the study is unchanged.  IMPRESSION:  Decrease in size and intensity of enhancement associated with the  biopsy proven malignancy located within the upper-outer quadrant of  the right breast. The mass now measures 3.7 x 2.0 x 1.9 cm in  size.  THREE-DIMENSIONAL MR IMAGE RENDERING ON INDEPENDENT WORKSTATION:  Three-dimensional MR images were rendered by post-processing of the  original MR data on an independent workstation. The three-  dimensional MR images were interpreted, and findings were reported  in the accompanying complete MRI report for this study.  BI-RADS CATEGORY 6: Known biopsy-proven malignancy - appropriate  action should be taken.  Original Report Authenticated By: Rolla Plate, M.D.      Assessment: 36 year old [place] woman status post right breast biopsy July of 2012 for a clinical T3 N1 (stage III) invasive ductal carcinoma, grade 3, strongly estrogen and progesterone receptor positive, HER-2 negative, with an MIB-1-1 of 98%.  (1) Status post 4 cycles of dose dense doxorubicin and cyclophosphamide, followed by weekly paclitaxel x 12  (2) Iron deficiency anemia, treated with Fereheme  (3) Hypertension  Plan: This case was reviewed with Dr. Darnelle Catalan, we both agree that it would be in Damia's best interest to discontinue her paclitaxel this point secondary to her peripheral neuropathy. Dajuana's very much in favor of this plan.  Accordingly, we  will cancel her chemotherapy appointment tomorrow, and we'll have her come in for a blood transfusion instead. She'll receive 2 units of packed red blood cells for hemoglobin today is 8.5. We'll continue to follow her hemoglobin closely.  Trannie will have a repeat breast MRI next week to assess response to neoadjuvant chemotherapy. She is already scheduled to meet with her surgeon, Dr. Carolynne Edouard, on January 29, and we'll see Dr. Darnelle Catalan on January 31 her to definitive surgery.  This plan was reviewed with the patient who voices her understanding and agreement. She knows as always to call  with any changes or problems.   Donelle Baba, PA-C 09/07/2011

## 2011-09-08 ENCOUNTER — Other Ambulatory Visit: Payer: Self-pay | Admitting: Physician Assistant

## 2011-09-08 ENCOUNTER — Ambulatory Visit (HOSPITAL_BASED_OUTPATIENT_CLINIC_OR_DEPARTMENT_OTHER): Payer: Medicaid Other

## 2011-09-08 VITALS — BP 127/87 | HR 97 | Temp 98.9°F | Resp 18

## 2011-09-08 DIAGNOSIS — D649 Anemia, unspecified: Secondary | ICD-10-CM

## 2011-09-08 DIAGNOSIS — D509 Iron deficiency anemia, unspecified: Secondary | ICD-10-CM

## 2011-09-08 MED ORDER — DIPHENHYDRAMINE HCL 25 MG PO CAPS
25.0000 mg | ORAL_CAPSULE | Freq: Once | ORAL | Status: AC
  Administered 2011-09-08: 25 mg via ORAL

## 2011-09-08 MED ORDER — HEPARIN SOD (PORK) LOCK FLUSH 100 UNIT/ML IV SOLN
500.0000 [IU] | Freq: Once | INTRAVENOUS | Status: AC
  Administered 2011-09-08: 500 [IU] via INTRAVENOUS
  Filled 2011-09-08: qty 5

## 2011-09-08 MED ORDER — SODIUM CHLORIDE 0.9 % IV SOLN
Freq: Once | INTRAVENOUS | Status: AC
  Administered 2011-09-08: 15:00:00 via INTRAVENOUS

## 2011-09-08 MED ORDER — ACETAMINOPHEN 325 MG PO TABS
650.0000 mg | ORAL_TABLET | Freq: Once | ORAL | Status: AC
  Administered 2011-09-08: 650 mg via ORAL

## 2011-09-08 MED ORDER — FUROSEMIDE 10 MG/ML IJ SOLN: 20.0000 mg | Freq: Once | INTRAMUSCULAR | Status: DC

## 2011-09-08 MED ORDER — SODIUM CHLORIDE 0.9 % IJ SOLN
10.0000 mL | Freq: Once | INTRAMUSCULAR | Status: AC
  Administered 2011-09-08: 10 mL via INTRAVENOUS
  Filled 2011-09-08: qty 10

## 2011-09-08 NOTE — Patient Instructions (Signed)
1715 d/c'd ambulatory to home. No complaints. Encouraged to call MD if any problems. Pt to return 09/09/11 for 2nd unit PRBCs. Informed to leave blue blood bank arm band on

## 2011-09-09 ENCOUNTER — Other Ambulatory Visit: Payer: Self-pay | Admitting: Certified Registered Nurse Anesthetist

## 2011-09-09 ENCOUNTER — Ambulatory Visit (HOSPITAL_BASED_OUTPATIENT_CLINIC_OR_DEPARTMENT_OTHER): Payer: Medicaid Other

## 2011-09-09 VITALS — BP 135/90 | HR 106 | Temp 98.5°F | Resp 18

## 2011-09-09 DIAGNOSIS — D509 Iron deficiency anemia, unspecified: Secondary | ICD-10-CM

## 2011-09-09 MED ORDER — HEPARIN SOD (PORK) LOCK FLUSH 100 UNIT/ML IV SOLN
500.0000 [IU] | Freq: Once | INTRAVENOUS | Status: AC
  Administered 2011-09-09: 500 [IU] via INTRAVENOUS
  Filled 2011-09-09: qty 5

## 2011-09-09 MED ORDER — SODIUM CHLORIDE 0.9 % IJ SOLN
10.0000 mL | INTRAMUSCULAR | Status: DC | PRN
  Administered 2011-09-09: 10 mL via INTRAVENOUS
  Filled 2011-09-09: qty 10

## 2011-09-09 MED ORDER — FUROSEMIDE 10 MG/ML IJ SOLN: 20.0000 mg | Freq: Once | INTRAMUSCULAR | Status: DC

## 2011-09-09 MED ORDER — ACETAMINOPHEN 325 MG PO TABS
650.0000 mg | ORAL_TABLET | Freq: Once | ORAL | Status: AC
  Administered 2011-09-09: 650 mg via ORAL

## 2011-09-09 MED ORDER — SODIUM CHLORIDE 0.9 % IV SOLN
Freq: Once | INTRAVENOUS | Status: AC
  Administered 2011-09-09: 50 mL via INTRAVENOUS

## 2011-09-09 MED ORDER — DIPHENHYDRAMINE HCL 25 MG PO CAPS
25.0000 mg | ORAL_CAPSULE | Freq: Once | ORAL | Status: AC
  Administered 2011-09-09: 25 mg via ORAL

## 2011-09-09 NOTE — Patient Instructions (Signed)
[facility] Discharge Instructions for Patients Receiving Blood   Today you received the following Blood. If you develop nausea and vomiting that is not controlled by your nausea medication, call the clinic. If it is after clinic hours your family physician or the after hours number for the clinic or go to the Emergency Department.   BELOW ARE SYMPTOMS THAT SHOULD BE REPORTED IMMEDIATELY:  *FEVER GREATER THAN 100.5 F  *CHILLS WITH OR WITHOUT FEVER  NAUSEA AND VOMITING THAT IS NOT CONTROLLED WITH YOUR NAUSEA MEDICATION  *UNUSUAL SHORTNESS OF BREATH  *UNUSUAL BRUISING OR BLEEDING  TENDERNESS IN MOUTH AND THROAT WITH OR WITHOUT PRESENCE OF ULCERS  *URINARY PROBLEMS  *BOWEL PROBLEMS  UNUSUAL RASH Items with * indicate a potential emergency and should be followed up as soon as possible.  . Please let the nurse know about any problems that you may have experienced. Feel free to call the clinic you have any questions or concerns. The clinic phone number is [phone].   I have been informed and understand all the instructions given to me. I know to contact the clinic, my physician, or go to the Emergency Department if any problems should occur. I do not have any questions at this time, but understand that I may call the clinic during office hours   should I have any questions or need assistance in obtaining follow up care.    __________________________________________  _____________  __________ Signature of Patient or Authorized Representative            Date                   Time    __________________________________________ Nurse's Signature

## 2011-09-09 NOTE — Progress Notes (Signed)
09/09/11. Pt. Received one unit of RBC today only. HL

## 2011-09-12 ENCOUNTER — Encounter (HOSPITAL_COMMUNITY): Payer: Medicaid Other

## 2011-09-12 ENCOUNTER — Telehealth: Payer: Self-pay | Admitting: Oncology

## 2011-09-12 ENCOUNTER — Encounter: Payer: Self-pay | Admitting: *Deleted

## 2011-09-12 NOTE — Telephone Encounter (Signed)
lmonvm of [name] from the [facility] regaridng this pt needing a breast mri appt

## 2011-09-12 NOTE — Progress Notes (Signed)
Message left by treatment room RN stating need to schedule MRI of breast.  Pt inquired at treatment today about appt- noted order in system.  This RN spoke with Crystal in scheduling to follow up with above.

## 2011-09-14 ENCOUNTER — Telehealth: Payer: Self-pay | Admitting: Oncology

## 2011-09-14 NOTE — Telephone Encounter (Signed)
S/w kathy mcconnell and she is aware of the mri appt that the pt needs and she stated she will take care of this and contact the pt

## 2011-09-15 ENCOUNTER — Other Ambulatory Visit: Payer: Medicaid Other | Admitting: Lab

## 2011-09-15 ENCOUNTER — Ambulatory Visit: Payer: Medicaid Other

## 2011-09-15 ENCOUNTER — Telehealth: Payer: Self-pay | Admitting: *Deleted

## 2011-09-15 ENCOUNTER — Ambulatory Visit (HOSPITAL_BASED_OUTPATIENT_CLINIC_OR_DEPARTMENT_OTHER): Payer: Medicaid Other | Admitting: Physician Assistant

## 2011-09-15 ENCOUNTER — Encounter: Payer: Self-pay | Admitting: Physician Assistant

## 2011-09-15 VITALS — BP 158/98 | HR 99 | Temp 98.8°F | Ht 67.0 in | Wt 314.5 lb

## 2011-09-15 DIAGNOSIS — D509 Iron deficiency anemia, unspecified: Secondary | ICD-10-CM

## 2011-09-15 DIAGNOSIS — C50919 Malignant neoplasm of unspecified site of unspecified female breast: Secondary | ICD-10-CM

## 2011-09-15 DIAGNOSIS — Z170 Estrogen receptor positive status [ER+]: Secondary | ICD-10-CM

## 2011-09-15 DIAGNOSIS — I10 Essential (primary) hypertension: Secondary | ICD-10-CM

## 2011-09-15 LAB — COMPREHENSIVE METABOLIC PANEL
AST: 25 U/L (ref 0–37)
Albumin: 3.7 g/dL (ref 3.5–5.2)
Alkaline Phosphatase: 93 U/L (ref 39–117)
BUN: 12 mg/dL (ref 6–23)
Calcium: 9.9 mg/dL (ref 8.4–10.5)
Chloride: 103 mEq/L (ref 96–112)
Creatinine, Ser: 0.81 mg/dL (ref 0.50–1.10)
Glucose, Bld: 126 mg/dL — ABNORMAL HIGH (ref 70–99)
Potassium: 3.5 mEq/L (ref 3.5–5.3)

## 2011-09-15 LAB — CBC WITH DIFFERENTIAL/PLATELET
Basophils Absolute: 0 10*3/uL (ref 0.0–0.1)
EOS%: 6.3 % (ref 0.0–7.0)
Eosinophils Absolute: 0.4 10*3/uL (ref 0.0–0.5)
HCT: 33.4 % — ABNORMAL LOW (ref 34.8–46.6)
HGB: 10.8 g/dL — ABNORMAL LOW (ref 11.6–15.9)
MCH: 28.3 pg (ref 25.1–34.0)
MCV: 87.7 fL (ref 79.5–101.0)
MONO%: 9.1 % (ref 0.0–14.0)
NEUT#: 4.2 10*3/uL (ref 1.5–6.5)
NEUT%: 69.5 % (ref 38.4–76.8)
Platelets: 296 10*3/uL (ref 145–400)

## 2011-09-15 NOTE — Telephone Encounter (Signed)
called [name] at the [facility] informing her that the patient needs to be scheduled before 09-20-2011 before she sees dr. Carolynne Edouard

## 2011-09-15 NOTE — Progress Notes (Signed)
ID: [name]  HPI:  36 year old [place] woman who developed pain in her right breast and brought it to her primary care physician's attention. Mammography at [facility] 03/10/2011 found the breasts to be heterogeneously dense. There was a mildly irregular mass posteriorly in the upper outer quadrant of the right breast. On ultrasound this was hypoechoic and measured approximately 2.4 cm, with irregular margins. The left breast was unremarkable.  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 Bilateral breast MRIs were obtained 03/18/2011, showing in the upper outer quadrant of the right breast an irregular mass measuring 5.5 cm. There was a suggestion of cortical thickening in the level I right axillary lymph node, which measured 1.2 cm. The patient was staged with chest CT scan and PET scan, which showed no distant disease. These studies did confirm the abnormality in the right breast as well as a hypermetabolic right axillary lymph node, making this a clinical T3 N1 or stage III invasive ductal carcinoma.   The patient was presented at the multidisciplinary breast cancer clinic and it was felt neoadjuvant chemotherapy would be in her best interest, to be followed by surgery, then radiation, then anti-estrogen therapy. Currently receiving neoadjuvant chemotherapy, the goal being to complete 4 cycles of dose dense doxorubicin cyclophosphamide, followed by 12 weekly doses of paclitaxel.  Patient completed all 4 cycles of doxorubicin/cyclophosphamide, and completed 8 weekly doses of paclitaxel before was discontinued secondary to peripheral neuropathy.    Interval History:  [name] returns today for followup of her locally advanced right breast carcinoma, having recently completed her neoadjuvant chemotherapy. Paclitaxel was  discontinued 2 weeks ago after 8 weekly doses secondary to the development and worsening of peripheral neuropathy. The neuropathy is still present, perhaps slightly improved in the feet, but still very persistent and uncomfortable.  [name] is status post blood transfusion last week, 2 units of packed red blood cells, and her hemoglobin has improved significantly from 8.5 on 09/07/2011, to 10.8 today. Her energy level is much improved. She is having less shortness of breath. She's had no dizziness. She denies any signs of abnormal bleeding.  [name] continues to have some headaches, often on a daily basis. She denies any dizziness, or change in vision associated with the headaches. No nausea or gait disturbance. Also I will note that a head CT obtained 09/05/2011 was normal.  [name] is also a little concerned about a lump she feels in her right axilla. She's noticed that for the first time this past week when lying on her left side. She does have a known enlarged and hypermetabolic right axillary lymph node.  A detailed review of systems is otherwise noncontributory as noted below.  Review of Systems: Constitutional: fatigue, general weakness, no weight loss, fever, night sweats Eyes: negative [id number] Cardiovascular: no chest pain, dyspnea on exertion Respiratory:  no orthopnea, shortness of breath, no cough or wheezing Neurological:  headaches, numbness/tingling in lower extremities Dermatological: negative Gastrointestinal: , no change in bowel habits, nausea, or black or bloody stools Genito-Urinary: no dysuria, trouble voiding, or hematuria Hematological and Lymphatic: negative Breast: negative Musculoskeletal: joint pain and muscle pain Remaining ROS negative.   PAST MEDICAL HISTORY:  Significant for hypertension, anemia, obesity, remote history of migraine (in childhood) and GERD.  The patient has had both her hips pinned because of falls at age 87.  FAMILY HISTORY:  The patient's  mother is alive at age 55.  The patient's father died from a myocardial infarction at the age of 87.  She has one sister and one brother.  There is no history of breast or ovarian cancer in the family.  GYNECOLOGIC HISTORY:  Menarche age 64.  Most recent period July 8.  Her periods usually last about 4 days of which the first 2 are heavy.  She has never used birth control pills.  She is GX P2.  First pregnancy to term age 69.  She also had a miscarriage.  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  Her husband of 3 years, [name], is present today.  He works in Holiday representative.  Daughter, [name], 15, and [name], 12, both live at home with the patient and her husband as does the patient's mother.  Medications: I have reviewed the patient's current medications.  Current Outpatient Prescriptions  Medication Sig Dispense Refill  . fluconazole (DIFLUCAN) 100 MG tablet Take 100 mg by mouth daily.        Marland Kitchen LORazepam (ATIVAN) 0.5 MG tablet Take 0.5 mg by mouth 2 (two) times daily as needed.        . metoCLOPramide (REGLAN) 10 MG tablet Take 10 mg by mouth 4 (four) times daily as needed.        Marland Kitchen olmesartan-hydrochlorothiazide (BENICAR HCT) 40-25 MG per tablet Take 1 tablet by mouth 2 (two) times daily.       . pantoprazole (PROTONIX) 40 MG tablet Take 40 mg by mouth daily.        . potassium chloride SA (K-DUR,KLOR-CON) 20 MEQ tablet Take 1 tablet (20 mEq total) by mouth 2 (two) times daily.  60 tablet  1  . prochlorperazine (COMPAZINE) 10 MG tablet Take 10 mg by mouth every 6 (six) hours as needed.        . ranitidine (ZANTAC) 15 MG/ML syrup Take 50 mg by mouth 2 (two) times daily.        Marland Kitchen senna-docusate (SENOKOT-S) 8.6-50 MG per tablet Take 1-2 tablets by mouth at bedtime.  20 tablet  0   No current facility-administered medications for this visit.   Facility-Administered Medications Ordered in Other Visits  Medication Dose Route Frequency Provider Last Rate Last  Dose  . 0.9 %  sodium chloride infusion   Intravenous Once Zollie Scale, PA         Objective:   Physical Exam: Filed Vitals:   09/15/11 1130  BP: 158/98  Pulse: 99  Temp: 98.8 F (37.1 C)   HEENT:  Sclerae anicteric, conjunctivae pink.  Oropharynx clear.  Buccal mucosa pink and moist. No mucositis or candidiasis.   Nodes:  No cervical or supraclavicular lymphadenopathy palpated. I was able to palpate a slightly enlarged node in the right axilla with the patient lying on her left side. This is not palpable in the supine position, with the patient on her back, or in the sitting position. Breast Exam: Deferred  Lungs:  Clear to auscultation bilaterally.  No crackles, rhonchi, or wheezes.   Heart:  Regular rate and  rhythm.  No gallops, murmurs, or rubs.   Abdomen:  Soft, obese, nontender.  Positive bowel sounds.   Musculoskeletal:  Slight  focal spinal tenderness to palpation in the cervical region.  Extremities:  Benign.  No peripheral edema or cyanosis.   Skin:  Benign with the exception of hyperpigmentation bilaterally in the upper extremities, including the nailbeds.  Neuro:  Nonfocal.  Lab Results:   CMP    Chemistry      Component Value Date/Time   NA 143 09/07/2011 1124   K 3.9 09/07/2011 1124   CL 107 09/07/2011 1124   CO2 27 09/07/2011 1124   BUN 15 09/07/2011 1124   CREATININE 0.86 09/07/2011 1124      Component Value Date/Time   CALCIUM 9.9 09/07/2011 1124   ALKPHOS 100 09/07/2011 1124   AST 18 09/07/2011 1124   ALT 19 09/07/2011 1124   BILITOT 0.2* 09/07/2011 1124       CBC Lab Results  Component Value Date   WBC 6.1 09/15/2011   HGB 10.8* 09/15/2011   HCT 33.4* 09/15/2011   MCV 87.7 09/15/2011   PLT 296 09/15/2011   NEUTROABS 4.2 09/15/2011    Studies/Results  *RADIOLOGY REPORT*  Jun 14, 2011 Clinical Data: Invasive ductal carcinoma of the right breast.  Follow-up evaluation while on neoadjuvant therapy.  BILATERAL BREAST MRI WITH AND WITHOUT CONTRAST    Technique: Multiplanar, multisequence MR images of both breasts  were obtained prior to and following the intravenous administration  of 20ml of Multihance. Three dimensional images were evaluated at  the independent DynaCad workstation.  Comparison: 03/18/2011.  Findings: There is a biopsy-proven enhancing malignancy within the  upper-outer quadrant of the right breast posteriorly located. The  intensity of enhancement has decreased as has the size of the mass.  The mass previously measured 5.5 x 2.6 x 3.7 cm in size and now  measures 3.7 x 2.0 x 1.9 cm in size. Also, the previously  described mildly prominent level I right axillary lymph node has  decreased in size and now measures 7 mm in greatest dimension  (previously 1.2 cm in greatest dimension). There are no new masses  and the remainder of the study is unchanged.  IMPRESSION:  Decrease in size and intensity of enhancement associated with the  biopsy proven malignancy located within the upper-outer quadrant of  the right breast. The mass now measures 3.7 x 2.0 x 1.9 cm in  size.  THREE-DIMENSIONAL MR IMAGE RENDERING ON INDEPENDENT WORKSTATION:  Three-dimensional MR images were rendered by post-processing of the  original MR data on an independent workstation. The three-  dimensional MR images were interpreted, and findings were reported  in the accompanying complete MRI report for this study.  BI-RADS CATEGORY 6: Known biopsy-proven malignancy - appropriate  action should be taken.  Original Report Authenticated By: Rolla Plate, M.D.      Assessment: 36 year old [place] woman status post right breast biopsy July of 2012 for a clinical T3 N1 (stage III) invasive ductal carcinoma, grade 3, strongly estrogen and progesterone receptor positive, HER-2 negative, with an MIB-1-1 of 98%.  (1) Status post 4 cycles of dose dense doxorubicin and cyclophosphamide, followed by weekly paclitaxel x 8, paclitaxel discontinued due to  peripheral neuropathy.  (2) Iron deficiency anemia, treated with Netta [place]ed[place]. Now with continued anemia, apparently anemia of chronic disease, and status post blood transfusion.  (3) Hypertension  Plan: Jaylise supposed to be scheduled for repeat breast MRI in late this week or early next week prior to meeting with her surgeon, Dr. Carolynne Edouard on January 29. For some reason this has been ordered, but was never scheduled. Our schedulers will be following up on this today. Donae is scheduled to meet with Dr. Carolynne Edouard on the 29th, and we'll see Dr. Darnelle Catalan on January 31 for reevaluation prior to definitive surgery.  This plan was reviewed  with the patient who voices her understanding and agreement. She knows as always to call with any changes or problems.   Tinika Bucknam, PA-C 09/15/2011

## 2011-09-16 ENCOUNTER — Encounter (HOSPITAL_COMMUNITY): Payer: Medicaid Other

## 2011-09-18 ENCOUNTER — Ambulatory Visit
Admission: RE | Admit: 2011-09-18 | Discharge: 2011-09-18 | Disposition: A | Discharge status: Home / Self Care | Payer: Medicaid Other | Source: Ambulatory Visit | Attending: Physician Assistant | Admitting: Physician Assistant

## 2011-09-18 DIAGNOSIS — C50919 Malignant neoplasm of unspecified site of unspecified female breast: Secondary | ICD-10-CM

## 2011-09-18 MED ORDER — GADOBENATE DIMEGLUMINE 529 MG/ML IV SOLN
20.0000 mL | Freq: Once | INTRAVENOUS | Status: AC | PRN
  Administered 2011-09-18: 20 mL via INTRAVENOUS

## 2011-09-20 ENCOUNTER — Ambulatory Visit (INDEPENDENT_AMBULATORY_CARE_PROVIDER_SITE_OTHER): Payer: Medicaid Other | Admitting: General Surgery

## 2011-09-20 ENCOUNTER — Encounter (HOSPITAL_COMMUNITY): Payer: Medicaid Other

## 2011-09-20 ENCOUNTER — Encounter (INDEPENDENT_AMBULATORY_CARE_PROVIDER_SITE_OTHER): Payer: Self-pay | Admitting: General Surgery

## 2011-09-20 VITALS — BP 118/82 | HR 101 | Temp 96.4°F | Ht 67.0 in | Wt 316.8 lb

## 2011-09-20 DIAGNOSIS — C50919 Malignant neoplasm of unspecified site of unspecified female breast: Secondary | ICD-10-CM

## 2011-09-20 NOTE — Patient Instructions (Signed)
Plan for right mastectomy and sentinel node biopsy

## 2011-09-20 NOTE — Progress Notes (Signed)
Subjective:     Patient ID: [name], female   DOB: 01/06/1976, 36 y.o.   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 It originally measured about 5-1/2 cm. She has been receiving neoadjuvant chemotherapy. Her chemotherapy was stopped in early January secondary to problems with her feet. She recently had a followup MRI study which showed the main tumor to have shrunk but there is enhancement linearly off the anterior surface of the tumor. The entire area measures approximately 6 cm. She is having some soreness in the upper outer right breast  Review of Systems  Constitutional: Negative.   HENT: Negative.   Eyes: Negative.   Respiratory: Negative.   Cardiovascular: Negative.   Gastrointestinal: Negative.   Genitourinary: Negative.   Musculoskeletal: Negative.   Skin: Negative.   Neurological: Negative.   Hematological: Negative.   Psychiatric/Behavioral: Negative.        Objective:   Physical Exam  Constitutional: She is oriented to person, place, and time. She appears well-developed and well-nourished.  HENT:  Head: Normocephalic and atraumatic.  Eyes: Conjunctivae and EOM are normal. Pupils are equal, round, and reactive to light.  Neck: Normal range of motion. Neck supple.  Cardiovascular: Normal rate, regular rhythm and normal heart sounds.   Pulmonary/Chest: Effort normal and breath sounds normal.       Small palpable mass in the upper outer right breast only when she is lying on her left side. No palpable axillary supraclavicular or cervical lymphadenopathy  Abdominal: Soft. Bowel sounds are normal. She exhibits no mass. There is no tenderness.  Musculoskeletal: Normal range of motion.  Lymphadenopathy:    She has no cervical adenopathy.  Neurological: She is alert and oriented to person, place, and time.  Skin: Skin is warm and dry.  Psychiatric: She has a normal mood and affect. Her behavior is  normal.       Assessment:     The patient has a large cancer in the upper outer right breast. It does seem as though the tumor has responded to chemotherapy but the entire measured area of involvement has not gotten any smaller. Because of this I think she would probably be best served with a mastectomy. She would also be a candidate for several node mapping. I've discussed with her in detail the risks and benefits of the operation to this as well as some of the technical aspects and she understands and wishes to proceed.    Plan:     Plan for right mastectomy and sentinel node mapping

## 2011-09-22 ENCOUNTER — Ambulatory Visit (HOSPITAL_BASED_OUTPATIENT_CLINIC_OR_DEPARTMENT_OTHER): Payer: Medicaid Other | Admitting: Oncology

## 2011-09-22 ENCOUNTER — Telehealth: Payer: Self-pay | Admitting: Oncology

## 2011-09-22 ENCOUNTER — Other Ambulatory Visit (HOSPITAL_BASED_OUTPATIENT_CLINIC_OR_DEPARTMENT_OTHER): Payer: Medicaid Other

## 2011-09-22 ENCOUNTER — Encounter (INDEPENDENT_AMBULATORY_CARE_PROVIDER_SITE_OTHER): Payer: Medicaid Other | Admitting: General Surgery

## 2011-09-22 ENCOUNTER — Ambulatory Visit: Payer: Medicaid Other

## 2011-09-22 VITALS — BP 130/85 | HR 103 | Temp 98.5°F | Ht 67.0 in | Wt 313.5 lb

## 2011-09-22 DIAGNOSIS — D638 Anemia in other chronic diseases classified elsewhere: Secondary | ICD-10-CM

## 2011-09-22 DIAGNOSIS — C50419 Malignant neoplasm of upper-outer quadrant of unspecified female breast: Secondary | ICD-10-CM

## 2011-09-22 DIAGNOSIS — C50919 Malignant neoplasm of unspecified site of unspecified female breast: Secondary | ICD-10-CM

## 2011-09-22 DIAGNOSIS — Z09 Encounter for follow-up examination after completed treatment for conditions other than malignant neoplasm: Secondary | ICD-10-CM

## 2011-09-22 DIAGNOSIS — D509 Iron deficiency anemia, unspecified: Secondary | ICD-10-CM

## 2011-09-22 DIAGNOSIS — R51 Headache: Secondary | ICD-10-CM

## 2011-09-22 DIAGNOSIS — I10 Essential (primary) hypertension: Secondary | ICD-10-CM

## 2011-09-22 LAB — CBC WITH DIFFERENTIAL/PLATELET
BASO%: 0.3 % (ref 0.0–2.0)
Basophils Absolute: 0 10*3/uL (ref 0.0–0.1)
EOS%: 5.8 % (ref 0.0–7.0)
MCH: 27.2 pg (ref 25.1–34.0)
MCHC: 31.5 g/dL (ref 31.5–36.0)
MCV: 86.4 fL (ref 79.5–101.0)
MONO%: 7 % (ref 0.0–14.0)
RBC: 3.97 10*6/uL (ref 3.70–5.45)
RDW: 14.7 % — ABNORMAL HIGH (ref 11.2–14.5)
lymph#: 1 10*3/uL (ref 0.9–3.3)

## 2011-09-22 LAB — COMPREHENSIVE METABOLIC PANEL
AST: 29 U/L (ref 0–37)
Albumin: 3.6 g/dL (ref 3.5–5.2)
Alkaline Phosphatase: 101 U/L (ref 39–117)
Glucose, Bld: 169 mg/dL — ABNORMAL HIGH (ref 70–99)
Potassium: 3.8 mEq/L (ref 3.5–5.3)
Sodium: 139 mEq/L (ref 135–145)
Total Bilirubin: 0.4 mg/dL (ref 0.3–1.2)
Total Protein: 7.7 g/dL (ref 6.0–8.3)

## 2011-09-22 NOTE — Telephone Encounter (Signed)
gve the pt her march 2013 appt calendar along with the mri/appt to see dr Roselind Messier in rad onc

## 2011-09-22 NOTE — Progress Notes (Signed)
ID: [name]  HPI:  36 year old [place] woman who developed pain in her right breast and brought it to her primary care physician's attention. Mammography at [facility] 03/10/2011 found the breasts to be heterogeneously dense. There was a mildly irregular mass posteriorly in the upper outer quadrant of the right breast. On ultrasound this was hypoechoic and measured approximately 2.4 cm, with irregular margins. The left breast was unremarkable.  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 Bilateral breast MRIs were obtained 03/18/2011, showing in the upper outer quadrant of the right breast an irregular mass measuring 5.5 cm. There was a suggestion of cortical thickening in the level I right axillary lymph node, which measured 1.2 cm. The patient was staged with chest CT scan and PET scan, which showed no distant disease. These studies did confirm the abnormality in the right breast as well as a hypermetabolic right axillary lymph node, making this a clinical T3 N1 or stage III invasive ductal carcinoma.   The patient was presented at the multidisciplinary breast cancer clinic and it was felt neoadjuvant chemotherapy would be in her best interest, to be followed by surgery, then radiation, then anti-estrogen therapy. Currently receiving neoadjuvant chemotherapy, the goal being to complete 4 cycles of dose dense doxorubicin cyclophosphamide, followed by 12 weekly doses of paclitaxel.  Patient completed all 4 cycles of doxorubicin/cyclophosphamide, and completed 8 weekly doses of paclitaxel before was discontinued secondary to peripheral neuropathy.    Interval History:  [name] returns today for followup of her locally advanced right breast carcinoma, having recently completed her neoadjuvant chemotherapy. Paclitaxel was  discontinued 2 weeks ago after 8 weekly doses secondary to the development and worsening of peripheral neuropathy. The neuropathy is still present, perhaps slightly improved in the feet, but still very persistent and uncomfortable.  [name] is status post blood transfusion last week, 2 units of packed red blood cells, and her hemoglobin has improved significantly from 8.5 on 09/07/2011, to 10.8 today. Her energy level is much improved. She is having less shortness of breath. She's had no dizziness. She denies any signs of abnormal bleeding.  [name] continues to have some headaches, often on a daily basis. She denies any dizziness, or change in vision associated with the headaches. No nausea or gait disturbance. Also I will note that a head CT obtained 09/05/2011 was normal.  [name] is also a little concerned about a lump she feels in her right axilla. She's noticed that for the first time this past week when lying on her left side. She does have a known enlarged and hypermetabolic right axillary lymph node.  A detailed review of systems is otherwise noncontributory as noted below.  Review of Systems: Constitutional: fatigue, general weakness, no weight loss, fever, night sweats Eyes: negative [id number] Cardiovascular: no chest pain, dyspnea on exertion Respiratory:  no orthopnea, shortness of breath, no cough or wheezing Neurological:  headaches, numbness/tingling in lower extremities Dermatological: negative Gastrointestinal: , no change in bowel habits, nausea, or black or bloody stools Genito-Urinary: no dysuria, trouble voiding, or hematuria Hematological and Lymphatic: negative Breast: negative Musculoskeletal: joint pain and muscle pain Remaining ROS negative.   PAST MEDICAL HISTORY:  Significant for hypertension, anemia, obesity, remote history of migraine (in childhood) and GERD.  The patient has had both her hips pinned because of falls at age 64.  FAMILY HISTORY:  The patient's  mother is alive at age 67.  The patient's father died from a myocardial infarction at the age of 67.  She has one sister and one brother.  There is no history of breast or ovarian cancer in the family.  GYNECOLOGIC HISTORY:  Menarche age 54.  Most recent period July 8.  Her periods usually last about 4 days of which the first 2 are heavy.  She has never used birth control pills.  She is GX P2.  First pregnancy to term age 4.  She also had a miscarriage.  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  Her husband of 3 years, [name], is present today.  He works in Holiday representative.  Daughter, [name], 15, and [name], 12, both live at home with the patient and her husband as does the patient's mother.  Medications: I have reviewed the patient's current medications.  Current Outpatient Prescriptions  Medication Sig Dispense Refill  . fluconazole (DIFLUCAN) 100 MG tablet Take 100 mg by mouth daily.        Marland Kitchen LORazepam (ATIVAN) 0.5 MG tablet Take 0.5 mg by mouth 2 (two) times daily as needed.        . metoCLOPramide (REGLAN) 10 MG tablet Take 10 mg by mouth 4 (four) times daily as needed.        Marland Kitchen olmesartan-hydrochlorothiazide (BENICAR HCT) 40-25 MG per tablet Take 1 tablet by mouth 2 (two) times daily.       . pantoprazole (PROTONIX) 40 MG tablet Take 40 mg by mouth daily.        . potassium chloride SA (K-DUR,KLOR-CON) 20 MEQ tablet Take 1 tablet (20 mEq total) by mouth 2 (two) times daily.  60 tablet  1  . prochlorperazine (COMPAZINE) 10 MG tablet Take 10 mg by mouth every 6 (six) hours as needed.        . ranitidine (ZANTAC) 15 MG/ML syrup Take 50 mg by mouth 2 (two) times daily.        Marland Kitchen senna-docusate (SENOKOT-S) 8.6-50 MG per tablet Take 1-2 tablets by mouth at bedtime.  20 tablet  0   No current facility-administered medications for this visit.   Facility-Administered Medications Ordered in Other Visits  Medication Dose Route Frequency Provider Last Rate Last  Dose  . 0.9 %  sodium chloride infusion   Intravenous Once Zollie Scale, PA       PAST MEDICAL HISTORY:  Significant for hypertension, anemia, obesity, remote history of migraine (in childhood) and GERD.  The patient has had both her hips pinned because of falls at age 62.  FAMILY HISTORY:  [name] is alive at age 80.  The patient's father died from a myocardial infarction at the age of 26.  She has one sister and one brother.  There is no history of breast or ovarian cancer in the family.  GYNECOLOGIC HISTORY:  Menarche age 40.  She is GX P2.  First pregnancy to term age 5.  She also had a miscarriage.  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  Her husband of 3 years, [name],  works in Holiday representative.  Daughters [place], 15, and [place], 12, both live at home with the patient and her husband as does the patient's mother.  HEALTH MAINTENANCE:  The patient never smoked and does not abuse alcohol.  She has never had a colonoscopy or bone density.  Most recent Pap smear was May 2012 and  unremarkable.  She does not know her cholesterol level.  Objective:   Physical Exam: Filed Vitals:   09/22/11 1128  BP: 130/85  Pulse: 103  Temp: 98.5 F (36.9 C)   HEENT:  Sclerae anicteric, conjunctivae pink.  Oropharynx clear.  Buccal mucosa pink and moist. No mucositis or candidiasis.   Nodes:  No cervical or supraclavicular lymphadenopathy palpated. I was able to palpate a slightly enlarged node in the right axilla with the patient lying on her left side. This is not palpable in the supine position, with the patient on her back, or in the sitting position. Breast Exam: Deferred  Lungs:  Clear to auscultation bilaterally.  No crackles, rhonchi, or wheezes.   Heart:  Regular rate and  rhythm.  No gallops, murmurs, or rubs.   Abdomen:  Soft, obese, nontender.  Positive bowel sounds.   Musculoskeletal:  Slight  focal spinal tenderness to palpation in the  cervical region.  Extremities:  Benign.  No peripheral edema or cyanosis.   Skin:  Benign with the exception of hyperpigmentation bilaterally in the upper extremities, including the nailbeds.   Neuro:  Nonfocal.  Lab Results:   CMP    Chemistry      Component Value Date/Time   NA 141 09/15/2011 1119   K 3.5 09/15/2011 1119   CL 103 09/15/2011 1119   CO2 29 09/15/2011 1119   BUN 12 09/15/2011 1119   CREATININE 0.81 09/15/2011 1119      Component Value Date/Time   CALCIUM 9.9 09/15/2011 1119   ALKPHOS 93 09/15/2011 1119   AST 25 09/15/2011 1119   ALT 26 09/15/2011 1119   BILITOT 0.4 09/15/2011 1119       CBC Lab Results  Component Value Date   WBC 7.0 09/22/2011   HGB 10.8* 09/22/2011   HCT 34.3* 09/22/2011   MCV 86.4 09/22/2011   PLT 310 09/22/2011   NEUTROABS 5.1 09/22/2011    Studies/Results  *RADIOLOGY REPORT*  Jun 14, 2011 BILATERAL BREAST MRI WITH AND WITHOUT CONTRAST  Comparison: 03/18/2011.  Findings: There is a biopsy-proven enhancing malignancy within the  upper-outer quadrant of the right breast posteriorly located. The  intensity of enhancement has decreased as has the size of the mass.  The mass previously measured 5.5 x 2.6 x 3.7 cm in size and now  measures 3.7 x 2.0 x 1.9 cm in size. Also, the previously  described mildly prominent level I right axillary lymph node has  decreased in size and now measures 7 mm in greatest dimension  (previously 1.2 cm in greatest dimension). There are no new masses  and the remainder of the study is unchanged.  IMPRESSION:  Decrease in size and intensity of enhancement associated with the  biopsy proven malignancy located within the upper-outer quadrant of  the right breast. The mass now measures 3.7 x 2.0 x 1.9 cm in  size.  ed By: Rolla Plate, M.D.      Assessment: 36 year old [place] woman status post right breast biopsy July of 2012 for a clinical T3 N1 (stage III) invasive ductal carcinoma, grade 3, strongly  estrogen and progesterone receptor positive, HER-2 negative, with an MIB-1-1 of 98%.  (1) Status post 4 cycles of dose dense doxorubicin and cyclophosphamide, followed by weekly paclitaxel x 8, paclitaxel discontinued due to peripheral neuropathy.  (2) Iron deficiency anemia, treated with Netta Cedars. Now with continued anemia, apparently anemia of chronic disease, and status post blood transfusion.  (3) Hypertension  Plan: [name] supposed to be  scheduled for repeat breast MRI in late this week or early next week prior to meeting with her surgeon, Dr. Carolynne Edouard on January 29. For some reason this has been ordered, but was never scheduled. Our schedulers will be following up on this today. Alexina is scheduled to meet with Dr. Carolynne Edouard on the 29th, and we'll see Dr. Darnelle Catalan on January 31 for reevaluation prior to definitive surgery.  This plan was reviewed with the patient who voices her understanding and agreement. She knows as always to call with any changes or problems.   Lowella Dell, PA-C 09/22/2011           ID: [name]  HPI:  36 year old [place] woman who developed pain in her right breast and brought it to her primary care physician's attention. Mammography at [facility] 03/10/2011 found the breasts to be heterogeneously dense. There was a mildly irregular mass posteriorly in the upper outer quadrant of the right breast. On ultrasound this was hypoechoic and measured approximately 2.4 cm, with irregular margins. The left breast was unremarkable.  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 Bilateral breast MRIs were obtained 03/18/2011, showing in the upper outer quadrant of the right breast an irregular mass measuring 5.5 cm. There was a suggestion of cortical thickening in the level I right axillary lymph node,  which measured 1.2 cm. The patient was staged with chest CT scan and PET scan, which showed no distant disease. These studies did confirm the abnormality in the right breast as well as a hypermetabolic right axillary lymph node, making this a clinical T3 N1 or stage III invasive ductal carcinoma.   The patient was presented at the multidisciplinary breast cancer clinic and it was felt neoadjuvant chemotherapy would be in her best interest, to be followed by surgery, then radiation, then anti-estrogen therapy. Currently receiving neoadjuvant chemotherapy, the goal being to complete 4 cycles of dose dense doxorubicin cyclophosphamide, followed by 12 weekly doses of paclitaxel.  Patient completed all 4 cycles of doxorubicin/cyclophosphamide, and completed 8 weekly doses of paclitaxel before was discontinued secondary to peripheral neuropathy.    Interval History:  Annette I. returns today for followup of her breast cancer. Since the last visit here she has met with Dr. Carolynne Edouard and the plan is for right mastectomy 10/14/2011. She had breast MRI last week, and discussed below.  Review of Systems: She has a history of headaches but the headaches seem to be more frequent and more bothersome. They're just about every day. They can wake her up in the morning, and sometimes they occur in the middle of the afternoon as well. She doesn't take any medication for this, she just "sleeps". She has a remote history of migraines but these are different from her earlier migraines. They're not accompanied by significant nausea and there has been no vomiting but she has had some blurred vision as well. She denies photophobia or stiff neck. Other problems include shortness of breath, particularly with walking up stairs or opposite slope, poor appetite, pain when she lies on her right side, chronic back pain, forgetfulness, depression, and mild hot flashes. She has not had menstrual period since October of 2012  PAST MEDICAL  HISTORY:  Significant for hypertension, anemia, obesity, remote history of migraine (in childhood) and GERD.  The patient has had both her hips pinned because of falls at  age 22.  FAMILY HISTORY:  [name] is alive at age 35.  The patient's father died from a myocardial infarction at the age of 4.  She has one sister and one brother.  There is no history of breast or ovarian cancer in the family.  GYNECOLOGIC HISTORY:  Menarche age 43. She never used birth control pills.  She is GX P2.  First pregnancy to term age 82.  She also had a miscarriage. Last menstrual period after the start of chemotherapy, August 2012.  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  Her husband of 3 years, Jeannene Tschetter, is present today.  He works in Holiday representative.  Daughter, [name], 15, and [name], 12, both live at home with the patient and her husband as does the patient's mother.  Medications: I have reviewed the patient's current medications.  Current Outpatient Prescriptions  Medication Sig Dispense Refill  . fluconazole (DIFLUCAN) 100 MG tablet Take 100 mg by mouth daily.        Marland Kitchen LORazepam (ATIVAN) 0.5 MG tablet Take 0.5 mg by mouth 2 (two) times daily as needed.        . metoCLOPramide (REGLAN) 10 MG tablet Take 10 mg by mouth 4 (four) times daily as needed.        Marland Kitchen olmesartan-hydrochlorothiazide (BENICAR HCT) 40-25 MG per tablet Take 1 tablet by mouth 2 (two) times daily.       . pantoprazole (PROTONIX) 40 MG tablet Take 40 mg by mouth daily.        . potassium chloride SA (K-DUR,KLOR-CON) 20 MEQ tablet Take 1 tablet (20 mEq total) by mouth 2 (two) times daily.  60 tablet  1  . prochlorperazine (COMPAZINE) 10 MG tablet Take 10 mg by mouth every 6 (six) hours as needed.        . ranitidine (ZANTAC) 15 MG/ML syrup Take 50 mg by mouth 2 (two) times daily.        Marland Kitchen senna-docusate (SENOKOT-S) 8.6-50 MG per tablet Take 1-2 tablets by mouth at bedtime.  20 tablet  0   No  current facility-administered medications for this visit.   Facility-Administered Medications Ordered in Other Visits  Medication Dose Route Frequency Provider Last Rate Last Dose  . 0.9 %  sodium chloride infusion   Intravenous Once Zollie Scale, PA         Objective:   Physical Exam: Filed Vitals:   09/22/11 1128  BP: 130/85  Pulse: 103  Temp: 98.5 F (36.9 C)   HEENT:  Sclerae anicteric, conjunctivae pink.  Oropharynx clear.  Buccal mucosa pink and moist. No mucositis or candidiasis.   Nodes:  No cervical or supraclavicular lymphadenopathy palpated. Particularly I do not palpate any abnormality in the right axilla Breast Exam: There is no right nipple retraction or skin change and I do not palpate a well-defined mass in the right breast. The left breast is unremarkable  Lungs:  Clear to auscultation bilaterally.  No crackles, rhonchi, or wheezes.   Heart:  Regular rate and  rhythm.  No gallops, murmurs, or rubs.   Abdomen:  Soft, obese, nontender.  Positive bowel sounds.   Musculoskeletal:  Slight  focal spinal tenderness to palpation in the cervical region.  Extremities:  Benign.  No peripheral edema or cyanosis.   Skin:  Unremarkable s.   Neuro:  She has full extraocular movements, midline tongue, and 5 over 5 motor all extremities. There is no sensory level.  Lab Results:  CMP    Chemistry      Component Value Date/Time   NA 139 09/22/2011 1115   K 3.8 09/22/2011 1115   CL 99 09/22/2011 1115   CO2 30 09/22/2011 1115   BUN 11 09/22/2011 1115   CREATININE 0.76 09/22/2011 1115      Component Value Date/Time   CALCIUM 10.0 09/22/2011 1115   ALKPHOS 101 09/22/2011 1115   AST 29 09/22/2011 1115   ALT 29 09/22/2011 1115   BILITOT 0.4 09/22/2011 1115       CBC Lab Results  Component Value Date   WBC 7.0 09/22/2011   HGB 10.8* 09/22/2011   HCT 34.3* 09/22/2011   MCV 86.4 09/22/2011   PLT 310 09/22/2011   NEUTROABS 5.1 09/22/2011    Studies/Results  *RADIOLOGY REPORT*  Jun 14, 2011 BILATERAL BREAST MRI WITH AND WITHOUT CONTRAST  Comparison: 03/18/2011.  Findings: There is a biopsy-proven enhancing malignancy within the  upper-outer quadrant of the right breast posteriorly located. The  intensity of enhancement has decreased as has the size of the mass.  The mass previously measured 5.5 x 2.6 x 3.7 cm in size and now  measures 3.7 x 2.0 x 1.9 cm in size. Also, the previously  described mildly prominent level I right axillary lymph node has  decreased in size and now measures 7 mm in greatest dimension  (previously 1.2 cm in greatest dimension). There are no new masses  and the remainder of the study is unchanged.  IMPRESSION:  Decrease in size and intensity of enhancement associated with the  biopsy proven malignancy located within the upper-outer quadrant of  the right breast. The mass now measures 3.7 x 2.0 x 1.9 cm in  size.  Original Report Authenticated By: Rolla Plate, M.D.      Assessment: 36 year old [place] woman status post right breast biopsy July of 2012 for a clinical T3 N1 (stage IIIA) invasive ductal carcinoma, grade 3, strongly estrogen and progesterone receptor positive, HER-2 negative, with an MIB-1-1 of 98%.  (1) Status post 4 cycles of dose dense doxorubicin and cyclophosphamide, followed by weekly paclitaxel x 8, paclitaxel discontinued due to peripheral neuropathy.  (2) Iron deficiency anemia, treated with Netta Cedars. Now with continued anemia, apparently anemia of chronic disease, and status post blood transfusion.  (3) Hypertension  Plan: The breast MRI shows a significant response, but certainly no resolution of the tumor. I am concerned about the headaches and we will obtain a brain MRI today if possible. My suspicion though is that this is more going to be related to depression and anxiety than anything else. If the MRI is negative she will start on amitriptyline 25 mg at bedtime and I wrote a prescription today. She has a  good understanding of the possible toxicities complications and side effects of that medication  Otherwise she will see Dr. Roselind Messier in mid February, and see me late March. At that time we will discuss the start of antiestrogen therapy.  MAGRINAT,GUSTAV C, PA-C 09/22/2011

## 2011-09-23 ENCOUNTER — Ambulatory Visit (HOSPITAL_COMMUNITY)
Admission: RE | Admit: 2011-09-23 | Discharge: 2011-09-23 | Disposition: A | Discharge status: Home / Self Care | Payer: Medicaid Other | Source: Ambulatory Visit | Attending: Oncology | Admitting: Oncology

## 2011-09-23 ENCOUNTER — Telehealth: Payer: Self-pay | Admitting: *Deleted

## 2011-09-23 DIAGNOSIS — C50919 Malignant neoplasm of unspecified site of unspecified female breast: Secondary | ICD-10-CM

## 2011-09-23 NOTE — Telephone Encounter (Signed)
Called pt per Dr. Darnelle Catalan to discuss h/a's & moving up MRI.  She reports having a h/a last hs that went away but cont to have intermittant h/a with no other symptoms.  Dr. Darnelle Catalan wants to move MRI to sooner.  [facility] called & was able to move MRI to today @ 5pm.  Pt. was notified.

## 2011-09-24 ENCOUNTER — Ambulatory Visit (HOSPITAL_COMMUNITY)
Admission: RE | Admit: 2011-09-24 | Discharge: 2011-09-24 | Disposition: A | Discharge status: Home / Self Care | Payer: Medicaid Other | Source: Ambulatory Visit | Attending: Oncology | Admitting: Oncology

## 2011-09-24 DIAGNOSIS — C50919 Malignant neoplasm of unspecified site of unspecified female breast: Secondary | ICD-10-CM | POA: Insufficient documentation

## 2011-09-24 DIAGNOSIS — R51 Headache: Secondary | ICD-10-CM | POA: Insufficient documentation

## 2011-09-24 MED ORDER — GADOBENATE DIMEGLUMINE 529 MG/ML IV SOLN
20.0000 mL | Freq: Once | INTRAVENOUS | Status: AC | PRN
  Administered 2011-09-24: 20 mL via INTRAVENOUS

## 2011-09-26 ENCOUNTER — Other Ambulatory Visit: Payer: Medicaid Other | Admitting: Lab

## 2011-09-26 MED ORDER — GADOBENATE DIMEGLUMINE 529 MG/ML IV SOLN
20.0000 mL | Freq: Once | INTRAVENOUS | Status: AC | PRN
  Administered 2011-09-26: 20 mL via INTRAVENOUS

## 2011-09-28 ENCOUNTER — Other Ambulatory Visit (HOSPITAL_COMMUNITY): Payer: Medicaid Other

## 2011-10-04 ENCOUNTER — Encounter (HOSPITAL_COMMUNITY): Payer: Self-pay | Admitting: Pharmacy Technician

## 2011-10-06 ENCOUNTER — Inpatient Hospital Stay (HOSPITAL_COMMUNITY): Admission: RE | Admit: 2011-10-06 | Payer: Medicaid Other | Source: Ambulatory Visit

## 2011-10-11 ENCOUNTER — Encounter (HOSPITAL_COMMUNITY): Payer: Self-pay

## 2011-10-11 ENCOUNTER — Encounter (HOSPITAL_COMMUNITY)
Admission: RE | Admit: 2011-10-11 | Discharge: 2011-10-11 | Disposition: A | Discharge status: Home / Self Care | Payer: Medicaid Other | Source: Ambulatory Visit | Attending: General Surgery | Admitting: General Surgery

## 2011-10-11 HISTORY — DX: Gastro-esophageal reflux disease without esophagitis: K21.9

## 2011-10-11 LAB — BASIC METABOLIC PANEL
Chloride: 102 mEq/L (ref 96–112)
GFR calc Af Amer: >90 mL/min (ref >90)
GFR calc non Af Amer: >90 mL/min (ref >90)
Potassium: 3.9 mEq/L (ref 3.5–5.1)

## 2011-10-11 LAB — CBC
HCT: 34.8 % — ABNORMAL LOW (ref 36.0–46.0)
Hemoglobin: 11.2 g/dL — ABNORMAL LOW (ref 12.0–15.0)
RDW: 14 % (ref 11.5–15.5)
WBC: 7.2 10*3/uL (ref 4.0–10.5)

## 2011-10-11 LAB — SURGICAL PCR SCREEN
MRSA, PCR: NEGATIVE
Staphylococcus aureus: NEGATIVE

## 2011-10-11 MED ORDER — CEFAZOLIN SODIUM-DEXTROSE 2-3 GM-% IV SOLR: 2.0000 g | INTRAVENOUS | Status: DC

## 2011-10-11 MED ORDER — CHLORHEXIDINE GLUCONATE 4 % EX LIQD: 1.0000 "application " | Freq: Once | CUTANEOUS | Status: DC

## 2011-10-11 NOTE — Pre-Procedure Instructions (Signed)
20 CHARNITA TRUDEL  10/11/2011      Your procedure is scheduled on:  Friday, FEB. 22 ND   Report to [facility] at  5:30   AM.  Call this number if you have problems the morning of surgery: [phone]   Remember:   Do not eat food:After Midnight THURSDAY.   May have clear liquids: up to 4 Hours before arrival time -- 1:30 AM.  Clear liquids include soda, tea, black coffee, apple or grape juice, broth.   Take these medicines the morning of surgery with A SIP OF WATER:  PROTONIX   Do not wear jewelry, make-up or nail polish.   Do not wear lotions, powders, or perfumes. You may wear deodorant.   Do not shave 48 hours prior to surgery.   Do not bring valuables to the hospital.   Contacts, dentures or bridgework may not be worn into surgery.  Leave suitcase in the car. After surgery it may be brought to your room.  For patients admitted to the hospital, checkout time is 11:00 AM the day of discharge.   Patients discharged the day of surgery will not be allowed to drive home.  Name and phone number of your driver:  Greggory Stallion  Juliana  --  SPOUSE                                                                                                        Special Instructions: CHG Shower Use Special Wash: 1/2 bottle night before surgery and 1/2 bottle morning of surgery.                  Please read over the following fact sheets that you were given: Pain Booklet, MRSA Information and Surgical Site Infection Prevention           STOP  TAKING  AND  BLOOD THINNERS, (coumadin, effient, plavix), ANTI-INFLAMMATORIES (advil, aleve, ibuprofen), HERBAL MEDICATIONS ( vitamins E)

## 2011-10-14 ENCOUNTER — Encounter (HOSPITAL_COMMUNITY): Payer: Self-pay | Admitting: Anesthesiology

## 2011-10-14 ENCOUNTER — Ambulatory Visit (HOSPITAL_COMMUNITY): Payer: Medicaid Other | Admitting: Anesthesiology

## 2011-10-14 ENCOUNTER — Encounter (HOSPITAL_COMMUNITY)
Admission: RE | Disposition: A | Discharge status: Home / Self Care | Payer: Self-pay | Source: Ambulatory Visit | Attending: General Surgery

## 2011-10-14 ENCOUNTER — Encounter (HOSPITAL_COMMUNITY): Payer: Self-pay | Admitting: *Deleted

## 2011-10-14 ENCOUNTER — Ambulatory Visit (HOSPITAL_COMMUNITY): Payer: Medicaid Other | Admitting: *Deleted

## 2011-10-14 ENCOUNTER — Encounter: Payer: Self-pay | Admitting: *Deleted

## 2011-10-14 ENCOUNTER — Encounter (HOSPITAL_COMMUNITY): Payer: Self-pay | Admitting: General Practice

## 2011-10-14 ENCOUNTER — Ambulatory Visit (HOSPITAL_COMMUNITY)
Admission: RE | Admit: 2011-10-14 | Discharge: 2011-10-16 | Disposition: A | Discharge status: Home / Self Care | Payer: Medicaid Other | Source: Ambulatory Visit | Attending: General Surgery | Admitting: General Surgery

## 2011-10-14 ENCOUNTER — Ambulatory Visit (HOSPITAL_COMMUNITY)
Admission: RE | Admit: 2011-10-14 | Discharge: 2011-10-14 | Disposition: A | Discharge status: Home / Self Care | Payer: Medicaid Other | Source: Ambulatory Visit | Attending: General Surgery | Admitting: General Surgery

## 2011-10-14 ENCOUNTER — Other Ambulatory Visit (INDEPENDENT_AMBULATORY_CARE_PROVIDER_SITE_OTHER): Payer: Self-pay | Admitting: General Surgery

## 2011-10-14 DIAGNOSIS — IMO0002 Reserved for concepts with insufficient information to code with codable children: Secondary | ICD-10-CM | POA: Insufficient documentation

## 2011-10-14 DIAGNOSIS — Z01812 Encounter for preprocedural laboratory examination: Secondary | ICD-10-CM | POA: Insufficient documentation

## 2011-10-14 DIAGNOSIS — C50919 Malignant neoplasm of unspecified site of unspecified female breast: Secondary | ICD-10-CM

## 2011-10-14 DIAGNOSIS — Z79899 Other long term (current) drug therapy: Secondary | ICD-10-CM | POA: Insufficient documentation

## 2011-10-14 DIAGNOSIS — E876 Hypokalemia: Secondary | ICD-10-CM

## 2011-10-14 DIAGNOSIS — C50419 Malignant neoplasm of upper-outer quadrant of unspecified female breast: Secondary | ICD-10-CM | POA: Insufficient documentation

## 2011-10-14 DIAGNOSIS — Y836 Removal of other organ (partial) (total) as the cause of abnormal reaction of the patient, or of later complication, without mention of misadventure at the time of the procedure: Secondary | ICD-10-CM | POA: Insufficient documentation

## 2011-10-14 DIAGNOSIS — I10 Essential (primary) hypertension: Secondary | ICD-10-CM | POA: Insufficient documentation

## 2011-10-14 DIAGNOSIS — K219 Gastro-esophageal reflux disease without esophagitis: Secondary | ICD-10-CM | POA: Insufficient documentation

## 2011-10-14 HISTORY — PX: WOUND EXPLORATION: SHX2669

## 2011-10-14 HISTORY — PX: MASTECTOMY W/ SENTINEL NODE BIOPSY: SHX2001

## 2011-10-14 LAB — CBC
MCH: 26.8 pg (ref 26.0–34.0)
MCV: 85.6 fL (ref 78.0–100.0)
Platelets: 292 10*3/uL (ref 150–400)
RDW: 14.2 % (ref 11.5–15.5)
WBC: 13.5 10*3/uL — ABNORMAL HIGH (ref 4.0–10.5)

## 2011-10-14 LAB — POCT I-STAT 4, (NA,K, GLUC, HGB,HCT)
HCT: 24 % — ABNORMAL LOW (ref 36.0–46.0)
Hemoglobin: 8.2 g/dL — ABNORMAL LOW (ref 12.0–15.0)

## 2011-10-14 LAB — BASIC METABOLIC PANEL
CO2: 27 mEq/L (ref 19–32)
Calcium: 8.8 mg/dL (ref 8.4–10.5)
Creatinine, Ser: 0.68 mg/dL (ref 0.50–1.10)

## 2011-10-14 LAB — CREATININE, SERUM
Creatinine, Ser: 0.7 mg/dL (ref 0.50–1.10)
GFR calc Af Amer: >90 mL/min (ref >90)

## 2011-10-14 LAB — PREPARE RBC (CROSSMATCH)

## 2011-10-14 SURGERY — MASTECTOMY WITH SENTINEL LYMPH NODE BIOPSY
Anesthesia: General | Site: Breast | Laterality: Right | Wound class: Clean

## 2011-10-14 SURGERY — WOUND EXPLORATION
Anesthesia: General | Site: Breast | Laterality: Right | Wound class: Clean

## 2011-10-14 MED ORDER — SODIUM CHLORIDE 0.9 % IJ SOLN
INTRAMUSCULAR | Status: DC | PRN
  Administered 2011-10-14: 08:00:00

## 2011-10-14 MED ORDER — GLYCOPYRROLATE 0.2 MG/ML IJ SOLN
INTRAMUSCULAR | Status: DC | PRN
  Administered 2011-10-14: .8 mg via INTRAVENOUS

## 2011-10-14 MED ORDER — HYDROMORPHONE HCL PF 1 MG/ML IJ SOLN
0.2500 mg | INTRAMUSCULAR | Status: DC | PRN
Start: 2011-10-14 — End: 2011-10-14

## 2011-10-14 MED ORDER — SODIUM CHLORIDE 0.9 % IJ SOLN
10.0000 mL | INTRAMUSCULAR | Status: DC | PRN
  Administered 2011-10-15 – 2011-10-16 (×4): 10 mL

## 2011-10-14 MED ORDER — AMITRIPTYLINE HCL 25 MG PO TABS
25.0000 mg | ORAL_TABLET | Freq: Every day | ORAL | Status: DC
  Administered 2011-10-15: 25 mg via ORAL
  Filled 2011-10-14 (×3): qty 1

## 2011-10-14 MED ORDER — MEPERIDINE HCL 25 MG/ML IJ SOLN: 6.2500 mg | INTRAMUSCULAR | Status: DC | PRN

## 2011-10-14 MED ORDER — ONDANSETRON HCL 4 MG/2ML IJ SOLN: 4.0000 mg | Freq: Four times a day (QID) | INTRAMUSCULAR | Status: DC | PRN

## 2011-10-14 MED ORDER — MIDAZOLAM HCL 5 MG/5ML IJ SOLN
INTRAMUSCULAR | Status: DC | PRN
  Administered 2011-10-14 (×2): 1 mg via INTRAVENOUS

## 2011-10-14 MED ORDER — ONDANSETRON HCL 4 MG PO TABS: 4.0000 mg | ORAL_TABLET | Freq: Four times a day (QID) | ORAL | Status: DC | PRN

## 2011-10-14 MED ORDER — TECHNETIUM TC 99M SULFUR COLLOID FILTERED
1.0000 | Freq: Once | INTRAVENOUS | Status: AC | PRN
  Administered 2011-10-14: 1 via INTRADERMAL

## 2011-10-14 MED ORDER — BENAZEPRIL HCL 20 MG PO TABS
20.0000 mg | ORAL_TABLET | Freq: Two times a day (BID) | ORAL | Status: DC
  Administered 2011-10-15 – 2011-10-16 (×3): 20 mg via ORAL
  Filled 2011-10-14 (×5): qty 1

## 2011-10-14 MED ORDER — MORPHINE SULFATE 4 MG/ML IJ SOLN
4.0000 mg | INTRAMUSCULAR | Status: DC | PRN
  Administered 2011-10-15: 4 mg via INTRAVENOUS
  Filled 2011-10-14: qty 1

## 2011-10-14 MED ORDER — FENTANYL CITRATE 0.05 MG/ML IJ SOLN
INTRAMUSCULAR | Status: DC | PRN
  Administered 2011-10-14: 25 ug via INTRAVENOUS
  Administered 2011-10-14: 100 ug via INTRAVENOUS
  Administered 2011-10-14 (×2): 50 ug via INTRAVENOUS
  Administered 2011-10-14: 25 ug via INTRAVENOUS
  Administered 2011-10-14: 100 ug via INTRAVENOUS
  Administered 2011-10-14: 50 ug via INTRAVENOUS
  Administered 2011-10-14: 100 ug via INTRAVENOUS

## 2011-10-14 MED ORDER — CEFAZOLIN SODIUM 1-5 GM-% IV SOLN
INTRAVENOUS | Status: DC | PRN
  Administered 2011-10-14: 2 g via INTRAVENOUS

## 2011-10-14 MED ORDER — HYDROMORPHONE HCL PF 1 MG/ML IJ SOLN
0.2500 mg | INTRAMUSCULAR | Status: DC | PRN
  Administered 2011-10-14 (×4): 0.5 mg via INTRAVENOUS

## 2011-10-14 MED ORDER — HYDROMORPHONE HCL PF 1 MG/ML IJ SOLN
INTRAMUSCULAR | Status: AC
  Filled 2011-10-14: qty 1

## 2011-10-14 MED ORDER — LACTATED RINGERS IV SOLN
INTRAVENOUS | Status: DC | PRN
  Administered 2011-10-14 (×2): via INTRAVENOUS

## 2011-10-14 MED ORDER — ONDANSETRON HCL 4 MG/2ML IJ SOLN
INTRAMUSCULAR | Status: DC | PRN
  Administered 2011-10-14: 4 mg via INTRAVENOUS

## 2011-10-14 MED ORDER — SODIUM CHLORIDE 0.9 % IV SOLN
INTRAVENOUS | Status: DC | PRN
  Administered 2011-10-14: 23:00:00 via INTRAVENOUS

## 2011-10-14 MED ORDER — MORPHINE SULFATE 2 MG/ML IJ SOLN: 0.0500 mg/kg | INTRAMUSCULAR | Status: DC | PRN

## 2011-10-14 MED ORDER — CEFAZOLIN SODIUM 1-5 GM-% IV SOLN
INTRAVENOUS | Status: AC
  Filled 2011-10-14: qty 100

## 2011-10-14 MED ORDER — BENAZEPRIL-HYDROCHLOROTHIAZIDE 20-12.5 MG PO TABS: 1.0000 | ORAL_TABLET | Freq: Two times a day (BID) | ORAL | Status: DC

## 2011-10-14 MED ORDER — FENTANYL CITRATE 0.05 MG/ML IJ SOLN
INTRAMUSCULAR | Status: DC | PRN
  Administered 2011-10-14 (×2): 100 ug via INTRAVENOUS
  Administered 2011-10-14: 50 ug via INTRAVENOUS

## 2011-10-14 MED ORDER — METOCLOPRAMIDE HCL 5 MG/ML IJ SOLN
INTRAMUSCULAR | Status: DC | PRN
  Administered 2011-10-14: 10 mg via INTRAVENOUS

## 2011-10-14 MED ORDER — LACTATED RINGERS IV SOLN
INTRAVENOUS | Status: DC | PRN
  Administered 2011-10-14 (×2): via INTRAVENOUS

## 2011-10-14 MED ORDER — POTASSIUM CHLORIDE CRYS ER 20 MEQ PO TBCR
20.0000 meq | EXTENDED_RELEASE_TABLET | Freq: Two times a day (BID) | ORAL | Status: DC
  Administered 2011-10-15 – 2011-10-16 (×3): 20 meq via ORAL
  Filled 2011-10-14 (×6): qty 1

## 2011-10-14 MED ORDER — ENOXAPARIN SODIUM 40 MG/0.4ML ~~LOC~~ SOLN
40.0000 mg | SUBCUTANEOUS | Status: DC
  Filled 2011-10-14: qty 0.4

## 2011-10-14 MED ORDER — PROPOFOL 10 MG/ML IV BOLUS
INTRAVENOUS | Status: DC | PRN
  Administered 2011-10-14: 300 mg via INTRAVENOUS

## 2011-10-14 MED ORDER — KCL IN DEXTROSE-NACL 20-5-0.9 MEQ/L-%-% IV SOLN
INTRAVENOUS | Status: DC
  Administered 2011-10-15 – 2011-10-16 (×4): via INTRAVENOUS
  Filled 2011-10-14 (×7): qty 1000

## 2011-10-14 MED ORDER — LIDOCAINE HCL (CARDIAC) 20 MG/ML IV SOLN
INTRAVENOUS | Status: DC | PRN
  Administered 2011-10-14 (×2): 40 mg via INTRAVENOUS

## 2011-10-14 MED ORDER — PANTOPRAZOLE SODIUM 40 MG PO TBEC
40.0000 mg | DELAYED_RELEASE_TABLET | Freq: Every day | ORAL | Status: DC
  Administered 2011-10-15: 40 mg via ORAL
  Filled 2011-10-14: qty 1

## 2011-10-14 MED ORDER — PROPOFOL 10 MG/ML IV EMUL
INTRAVENOUS | Status: DC | PRN
  Administered 2011-10-14: 50 mg via INTRAVENOUS
  Administered 2011-10-14: 150 mg via INTRAVENOUS

## 2011-10-14 MED ORDER — SUCCINYLCHOLINE CHLORIDE 20 MG/ML IJ SOLN
INTRAMUSCULAR | Status: DC | PRN
  Administered 2011-10-14: 100 mg via INTRAVENOUS

## 2011-10-14 MED ORDER — 0.9 % SODIUM CHLORIDE (POUR BTL) OPTIME
TOPICAL | Status: DC | PRN
  Administered 2011-10-14: 1000 mL

## 2011-10-14 MED ORDER — ONDANSETRON HCL 4 MG/2ML IJ SOLN: 4.0000 mg | Freq: Once | INTRAMUSCULAR | Status: DC | PRN

## 2011-10-14 MED ORDER — ROCURONIUM BROMIDE 100 MG/10ML IV SOLN
INTRAVENOUS | Status: DC | PRN
  Administered 2011-10-14: 40 mg via INTRAVENOUS
  Administered 2011-10-14: 10 mg via INTRAVENOUS

## 2011-10-14 MED ORDER — METOCLOPRAMIDE HCL 5 MG/ML IJ SOLN: 10.0000 mg | Freq: Once | INTRAMUSCULAR | Status: DC | PRN

## 2011-10-14 MED ORDER — HYDROCODONE-ACETAMINOPHEN 5-325 MG PO TABS
ORAL_TABLET | ORAL | Status: AC
  Filled 2011-10-14: qty 2

## 2011-10-14 MED ORDER — CEFAZOLIN SODIUM-DEXTROSE 2-3 GM-% IV SOLR
INTRAVENOUS | Status: AC
  Administered 2011-10-14: 2 g via INTRAVENOUS
  Filled 2011-10-14: qty 50

## 2011-10-14 MED ORDER — 0.9 % SODIUM CHLORIDE (POUR BTL) OPTIME
TOPICAL | Status: DC | PRN
  Administered 2011-10-14: 2000 mL

## 2011-10-14 MED ORDER — NEOSTIGMINE METHYLSULFATE 1 MG/ML IJ SOLN
INTRAMUSCULAR | Status: DC | PRN
  Administered 2011-10-14: 5 mg via INTRAVENOUS

## 2011-10-14 MED ORDER — HYDROCODONE-ACETAMINOPHEN 5-325 MG PO TABS: 1.0000 | ORAL_TABLET | Freq: Four times a day (QID) | ORAL | Status: DC | PRN

## 2011-10-14 MED ORDER — HYDROCHLOROTHIAZIDE 12.5 MG PO CAPS
12.5000 mg | ORAL_CAPSULE | Freq: Two times a day (BID) | ORAL | Status: DC
  Administered 2011-10-15 – 2011-10-16 (×3): 12.5 mg via ORAL
  Filled 2011-10-14 (×5): qty 1

## 2011-10-14 MED ORDER — ONDANSETRON HCL 4 MG/2ML IJ SOLN
INTRAMUSCULAR | Status: DC | PRN
  Administered 2011-10-14 (×2): 4 mg via INTRAVENOUS

## 2011-10-14 MED ORDER — HYDROCODONE-ACETAMINOPHEN 5-325 MG PO TABS
1.0000 | ORAL_TABLET | ORAL | Status: DC | PRN
  Administered 2011-10-14 (×2): 2 via ORAL
  Administered 2011-10-15: 1 via ORAL
  Administered 2011-10-15 – 2011-10-16 (×3): 2 via ORAL
  Filled 2011-10-14 (×5): qty 2

## 2011-10-14 SURGICAL SUPPLY — 61 items
ADH SKN CLS APL DERMABOND .7 (GAUZE/BANDAGES/DRESSINGS) ×1
ADH SKN CLS LQ APL DERMABOND (GAUZE/BANDAGES/DRESSINGS) ×1
APPLIER CLIP 9.375 MED OPEN (MISCELLANEOUS) ×2
APR CLP MED 9.3 20 MLT OPN (MISCELLANEOUS) ×1
BANDAGE ELASTIC 6 VELCRO ST LF (GAUZE/BANDAGES/DRESSINGS) ×1 IMPLANT
BINDER BREAST LRG (GAUZE/BANDAGES/DRESSINGS) IMPLANT
BINDER BREAST XLRG (GAUZE/BANDAGES/DRESSINGS) IMPLANT
BINDER BREAST XXLRG (GAUZE/BANDAGES/DRESSINGS) ×1 IMPLANT
CANISTER SUCTION 2500CC (MISCELLANEOUS) ×2 IMPLANT
CHLORAPREP W/TINT 26ML (MISCELLANEOUS) ×2 IMPLANT
CLIP APPLIE 9.375 MED OPEN (MISCELLANEOUS) IMPLANT
CLOTH BEACON ORANGE TIMEOUT ST (SAFETY) ×2 IMPLANT
CONT SPEC 4OZ CLIKSEAL STRL BL (MISCELLANEOUS) ×3 IMPLANT
COVER PROBE W GEL 5X96 (DRAPES) ×2 IMPLANT
COVER SURGICAL LIGHT HANDLE (MISCELLANEOUS) ×2 IMPLANT
DERMABOND ADHESIVE PROPEN (GAUZE/BANDAGES/DRESSINGS) ×1
DERMABOND ADVANCED (GAUZE/BANDAGES/DRESSINGS) ×1
DERMABOND ADVANCED .7 DNX12 (GAUZE/BANDAGES/DRESSINGS) ×1 IMPLANT
DERMABOND ADVANCED .7 DNX6 (GAUZE/BANDAGES/DRESSINGS) IMPLANT
DRAIN CHANNEL 19F RND (DRAIN) ×3 IMPLANT
DRAPE LAPAROSCOPIC ABDOMINAL (DRAPES) ×2 IMPLANT
DRAPE UTILITY 15X26 W/TAPE STR (DRAPE) ×4 IMPLANT
DRSG PAD ABDOMINAL 8X10 ST (GAUZE/BANDAGES/DRESSINGS) ×1 IMPLANT
ELECT BLADE 4.0 EZ CLEAN MEGAD (MISCELLANEOUS) ×2
ELECT CAUTERY BLADE 6.4 (BLADE) ×2 IMPLANT
ELECT REM PT RETURN 9FT ADLT (ELECTROSURGICAL) ×2
ELECTRODE BLDE 4.0 EZ CLN MEGD (MISCELLANEOUS) IMPLANT
ELECTRODE REM PT RTRN 9FT ADLT (ELECTROSURGICAL) ×1 IMPLANT
EVACUATOR SILICONE 100CC (DRAIN) ×3 IMPLANT
GAUZE XEROFORM 5X9 LF (GAUZE/BANDAGES/DRESSINGS) ×1 IMPLANT
GLOVE BIO SURGEON STRL SZ7.5 (GLOVE) ×4 IMPLANT
GLOVE BIOGEL PI IND STRL 7.5 (GLOVE) IMPLANT
GLOVE BIOGEL PI INDICATOR 7.5 (GLOVE) ×1
GOWN STRL NON-REIN LRG LVL3 (GOWN DISPOSABLE) ×4 IMPLANT
KIT BASIN OR (CUSTOM PROCEDURE TRAY) ×2 IMPLANT
KIT ROOM TURNOVER OR (KITS) ×2 IMPLANT
NDL 18GX1X1/2 (RX/OR ONLY) (NEEDLE) ×1 IMPLANT
NDL HYPO 25GX1X1/2 BEV (NEEDLE) ×1 IMPLANT
NEEDLE 18GX1X1/2 (RX/OR ONLY) (NEEDLE) ×2 IMPLANT
NEEDLE HYPO 25GX1X1/2 BEV (NEEDLE) ×2 IMPLANT
NS IRRIG 1000ML POUR BTL (IV SOLUTION) ×2 IMPLANT
PACK GENERAL/GYN (CUSTOM PROCEDURE TRAY) ×2 IMPLANT
PAD ARMBOARD 7.5X6 YLW CONV (MISCELLANEOUS) ×4 IMPLANT
PEN SKIN MARKING BROAD (MISCELLANEOUS) ×1 IMPLANT
SHEARS HARMONIC 9CM CVD (BLADE) IMPLANT
SPECIMEN JAR LARGE (MISCELLANEOUS) ×1 IMPLANT
SPECIMEN JAR X LARGE (MISCELLANEOUS) ×1 IMPLANT
SPONGE GAUZE 4X4 12PLY (GAUZE/BANDAGES/DRESSINGS) ×2 IMPLANT
SUT ETHILON 3 0 FSL (SUTURE) ×3 IMPLANT
SUT MON AB 4-0 PC3 18 (SUTURE) ×2 IMPLANT
SUT SILK 3 0 SH 30 (SUTURE) ×1 IMPLANT
SUT VIC AB 3-0 54X BRD REEL (SUTURE) ×1 IMPLANT
SUT VIC AB 3-0 BRD 54 (SUTURE) ×2
SUT VIC AB 3-0 SH 18 (SUTURE) ×2 IMPLANT
SUT VIC AB 3-0 SH 27 (SUTURE)
SUT VIC AB 3-0 SH 27XBRD (SUTURE) ×1 IMPLANT
SYR CONTROL 10ML LL (SYRINGE) ×2 IMPLANT
TAPE CLOTH SURG 6X10 WHT LF (GAUZE/BANDAGES/DRESSINGS) ×2 IMPLANT
TOWEL OR 17X24 6PK STRL BLUE (TOWEL DISPOSABLE) ×2 IMPLANT
TOWEL OR 17X26 10 PK STRL BLUE (TOWEL DISPOSABLE) ×2 IMPLANT
WATER STERILE IRR 1000ML POUR (IV SOLUTION) IMPLANT

## 2011-10-14 SURGICAL SUPPLY — 44 items
APL SKNCLS STERI-STRIP NONHPOA (GAUZE/BANDAGES/DRESSINGS)
BANDAGE ELASTIC 6 VELCRO ST LF (GAUZE/BANDAGES/DRESSINGS) ×2 IMPLANT
BENZOIN TINCTURE PRP APPL 2/3 (GAUZE/BANDAGES/DRESSINGS) ×1 IMPLANT
BINDER BREAST LRG (GAUZE/BANDAGES/DRESSINGS) IMPLANT
BINDER BREAST XLRG (GAUZE/BANDAGES/DRESSINGS) IMPLANT
BLADE SURG 10 STRL SS (BLADE) ×3 IMPLANT
BLADE SURG 15 STRL LF DISP TIS (BLADE) ×2 IMPLANT
BLADE SURG 15 STRL SS (BLADE) ×3
CHLORAPREP W/TINT 26ML (MISCELLANEOUS) ×1 IMPLANT
CLOTH BEACON ORANGE TIMEOUT ST (SAFETY) ×3 IMPLANT
CONT SPEC 4OZ CLIKSEAL STRL BL (MISCELLANEOUS) IMPLANT
COVER SURGICAL LIGHT HANDLE (MISCELLANEOUS) ×3 IMPLANT
DECANTER SPIKE VIAL GLASS SM (MISCELLANEOUS) ×2 IMPLANT
DEVICE DUBIN SPECIMEN MAMMOGRA (MISCELLANEOUS) IMPLANT
DRAPE LAPAROSCOPIC ABDOMINAL (DRAPES) ×3 IMPLANT
DRAPE UTILITY 15X26 W/TAPE STR (DRAPE) ×6 IMPLANT
DRSG PAD ABDOMINAL 8X10 ST (GAUZE/BANDAGES/DRESSINGS) ×8 IMPLANT
ELECT CAUTERY BLADE 6.4 (BLADE) ×3 IMPLANT
ELECT REM PT RETURN 9FT ADLT (ELECTROSURGICAL) ×3
ELECTRODE REM PT RTRN 9FT ADLT (ELECTROSURGICAL) ×2 IMPLANT
GAUZE SPONGE 4X4 12PLY STRL LF (GAUZE/BANDAGES/DRESSINGS) ×8 IMPLANT
GAUZE SPONGE 4X4 16PLY XRAY LF (GAUZE/BANDAGES/DRESSINGS) ×1 IMPLANT
GOWN PREVENTION PLUS XLARGE (GOWN DISPOSABLE) ×3 IMPLANT
GOWN STRL NON-REIN LRG LVL3 (GOWN DISPOSABLE) ×6 IMPLANT
KIT BASIN OR (CUSTOM PROCEDURE TRAY) ×3 IMPLANT
KIT ROOM TURNOVER OR (KITS) ×3 IMPLANT
MARGIN MAP 10MM (MISCELLANEOUS) IMPLANT
NDL HYPO 25GX1X1/2 BEV (NEEDLE) IMPLANT
NEEDLE HYPO 25GX1X1/2 BEV (NEEDLE) IMPLANT
NS IRRIG 1000ML POUR BTL (IV SOLUTION) ×5 IMPLANT
PACK SURGICAL SETUP 50X90 (CUSTOM PROCEDURE TRAY) ×3 IMPLANT
PAD ARMBOARD 7.5X6 YLW CONV (MISCELLANEOUS) ×6 IMPLANT
PENCIL BUTTON HOLSTER BLD 10FT (ELECTRODE) ×3 IMPLANT
SPONGE GAUZE 4X4 12PLY (GAUZE/BANDAGES/DRESSINGS) ×3 IMPLANT
STAPLER VISISTAT 35W (STAPLE) ×2 IMPLANT
STRIP CLOSURE SKIN 1/4X4 (GAUZE/BANDAGES/DRESSINGS) ×1 IMPLANT
SUT MON AB 3-0 SH 27 (SUTURE)
SUT MON AB 3-0 SH27 (SUTURE) IMPLANT
SUT MON AB 4-0 PC3 18 (SUTURE) ×3 IMPLANT
SUT VIC AB 3-0 SH 18 (SUTURE) ×6 IMPLANT
SYR CONTROL 10ML LL (SYRINGE) IMPLANT
TOWEL OR 17X24 6PK STRL BLUE (TOWEL DISPOSABLE) ×3 IMPLANT
TOWEL OR 17X26 10 PK STRL BLUE (TOWEL DISPOSABLE) ×3 IMPLANT
WATER STERILE IRR 1000ML POUR (IV SOLUTION) ×1 IMPLANT

## 2011-10-14 NOTE — Interval H&P Note (Signed)
History and Physical Interval Note:  10/14/2011 6:56 AM  [name]  has presented today for surgery, with the diagnosis of right breast cancer  The various methods of treatment have been discussed with the patient and family. After consideration of risks, benefits and other options for treatment, the patient has consented to  Procedure(s) (LRB): MASTECTOMY WITH SENTINEL LYMPH NODE BIOPSY (Right) as a surgical intervention .  The patients' history has been reviewed, patient examined, no change in status, stable for surgery.  I have reviewed the patients' chart and labs.  Questions were answered to the patient's satisfaction.     TOTH III,Zarai Orsborn S

## 2011-10-14 NOTE — Transfer of Care (Signed)
Immediate Anesthesia Transfer of Care Note  Patient: [name]  Procedure(s) Performed: Procedure(s) (LRB): BREAST BIOPSY (Right)  Patient Location: PACU  Anesthesia Type: General  Level of Consciousness: awake  Airway & Oxygen Therapy: Patient Spontanous Breathing and Patient connected to nasal cannula oxygen  Post-op Assessment: Report given to PACU RN and Post -op Vital signs reviewed and stable  Post vital signs: Reviewed and stable  Complications: No apparent anesthesia complications

## 2011-10-14 NOTE — Anesthesia Postprocedure Evaluation (Signed)
Anesthesia Post Note  Patient: [name]  Procedure(s) Performed: Procedure(s) (LRB): WOUND EXPLORATION (Right)  Anesthesia type: general  Patient location: PACU  Post pain: Pain level controlled  Post assessment: Patient's Cardiovascular Status Stable  Last Vitals:  Filed Vitals:   10/14/11 2344  BP: 123/69  Pulse: 90  Temp:   Resp: 12    Post vital signs: Reviewed and stable  Level of consciousness: sedated  Complications: No apparent anesthesia complications

## 2011-10-14 NOTE — Preoperative (Signed)
Beta Blockers   Reason not to administer Beta Blockers:Not Applicable 

## 2011-10-14 NOTE — Anesthesia Preprocedure Evaluation (Addendum)
Anesthesia Evaluation  Patient identified by MRN, date of birth, ID band Patient awake    Reviewed: Allergy & Precautions, H&P , NPO status , Patient's Chart, lab work & pertinent test results  Airway Mallampati: II TM Distance: >3 FB Neck ROM: Full    Dental   Pulmonary          Cardiovascular hypertension, Pt. on medications     Neuro/Psych  Headaches, PSYCHIATRIC DISORDERS Depression    GI/Hepatic GERD-  ,  Endo/Other  Morbid obesity  Renal/GU      Musculoskeletal   Abdominal   Peds  Hematology   Anesthesia Other Findings   Reproductive/Obstetrics                          Anesthesia Physical Anesthesia Plan  ASA: III  Anesthesia Plan: General   Post-op Pain Management:    Induction: Intravenous  Airway Management Planned: Oral ETT  Additional Equipment:   Intra-op Plan:   Post-operative Plan: Extubation in OR  Informed Consent: I have reviewed the patients History and Physical, chart, labs and discussed the procedure including the risks, benefits and alternatives for the proposed anesthesia with the patient or authorized representative who has indicated his/her understanding and acceptance.   Dental advisory given  Plan Discussed with: CRNA and Surgeon  Anesthesia Plan Comments:        Anesthesia Quick Evaluation

## 2011-10-14 NOTE — Anesthesia Preprocedure Evaluation (Addendum)
Anesthesia Evaluation  Patient identified by MRN, date of birth, ID band Patient awake    Reviewed: Allergy & Precautions, H&P , NPO status , Patient's Chart, lab work & pertinent test results, reviewed documented beta blocker date and time   Airway Mallampati: II TM Distance: >3 FB Neck ROM: full    Dental  (+) Dental Advisory Given   Pulmonary shortness of breath and with exertion,          Cardiovascular hypertension, On Medications     Neuro/Psych  Headaches, PSYCHIATRIC DISORDERS    GI/Hepatic Neg liver ROS, GERD-  Medicated and Controlled,  Endo/Other  Negative Endocrine ROSMorbid obesity  Renal/GU negative Renal ROS  Genitourinary negative   Musculoskeletal   Abdominal   Peds  Hematology negative hematology ROS (+)   Anesthesia Other Findings See surgeon's H&P   Reproductive/Obstetrics negative OB ROS                          Anesthesia Physical Anesthesia Plan  ASA: III and Emergent  Anesthesia Plan: General   Post-op Pain Management:    Induction: Intravenous, Rapid sequence and Cricoid pressure planned  Airway Management Planned: Oral ETT  Additional Equipment:   Intra-op Plan:   Post-operative Plan: Extubation in OR  Informed Consent: I have reviewed the patients History and Physical, chart, labs and discussed the procedure including the risks, benefits and alternatives for the proposed anesthesia with the patient or authorized representative who has indicated his/her understanding and acceptance.     Plan Discussed with: CRNA and Surgeon  Anesthesia Plan Comments:         Anesthesia Quick Evaluation

## 2011-10-14 NOTE — Op Note (Signed)
10/14/2011  11:26 PM  PATIENT:  [name]  37 y.o. female  PRE-OPERATIVE DIAGNOSIS:  bleeding right mastectomy wound  POST-OPERATIVE DIAGNOSIS:  same  PROCEDURE:  Procedure(s): EXPLORATION MASTECTOMY WOUND  SURGEON:  Surgeon(s): Liz Malady, MD  PHYSICIAN ASSISTANT:   ASSISTANTS: none   ANESTHESIA:   general  EBL:  Total I/O In: 1450 [I.V.:1100; Blood:350] Out: 825 [Urine:250; Drains:575]  BLOOD ADMINISTERED:1u CC PRBC  DRAINS: 2 JPs   SPECIMEN:  No Specimen  DISPOSITION OF SPECIMEN:  N/A  COUNTS:  YES  DICTATION: Reubin Milan Dictation patient underwent right mastectomy and sentinel node biopsy earlier today. She developed large amount of bloody drain output this evening. She is brought emergently back the operating room. Informed consent was obtained. She received intravenous antibiotics. She was identified in the preop holding area. Her site was marked. She was brought to the operating room and general anesthesia was a Optician, dispensing by the anesthesia staff. Her old incision was prepped and draped in sterile fashion. Incision was opened by cutting and removing the suture including Vicryl sutures. There is a large amount of clot collected under the flaps especially down in the axilla. There is a cavity in that area due to the patient's body habitus. All the clot was evacuated. The wound was thoroughly explored. There were several small oozing areas on the pectoralis that were cauterized. There were several areas of oozing the axilla as well that were cauterized. There was no large active bleeder found. The wound was copiously irrigated and fully inspected twice was no further bleeding noted. The left laparotomy sponges in place and allowed some time to see if there is any further bleeding. There was none. Drains were repositioned. Wound remained dry. Subcutaneous tissues were approximated with interrupted 3-0 Vicryl's. Skin was closed with staples. A sterile dressing with Ace  compression was applied. A drains held suction well. All counts were correct. There were no apparent complications. Patient was taken recovery in stable condition.  PATIENT DISPOSITION:  PACU - hemodynamically stable.   Delay start of Pharmacological VTE agent (>24hrs) due to surgical blood loss or risk of bleeding:  yes  Violeta Gelinas, MD, MPH, FACS Pager: [phone]  2/22/201311:26 PM

## 2011-10-14 NOTE — Progress Notes (Signed)
Patient ID: [name], female   DOB: [birth year], 36 y.o.   MRN: [id number] Patient with further bloody drain output.  Will take emergently to OR for exploration of mastectomy wound. Procedure, risks, and benefits D/W patient. She agrees.  Violeta Gelinas, MD, MPH, FACS Pager: [phone]

## 2011-10-14 NOTE — Op Note (Signed)
10/14/2011  9:54 AM  PATIENT:  [name]  36 y.o. female  PRE-OPERATIVE DIAGNOSIS:  right breast cancer  POST-OPERATIVE DIAGNOSIS:  right breast cancer  PROCEDURE:  Procedure(s) (LRB): MASTECTOMY WITH SENTINEL LYMPH NODE BIOPSY (Right)  SURGEON:  Surgeon(s) and Role:    * Robyne Askew, MD - Primary    * Wilmon Arms. Corliss Skains, MD - Assisting  PHYSICIAN ASSISTANT:   ASSISTANTS: Dr. Corliss Skains   ANESTHESIA:   general  EBL:  Total I/O In: 1000 [I.V.:1000] Out: 75 [Blood:75]  BLOOD ADMINISTERED:none  DRAINS: (2) Jackson-Pratt drain(s) with closed bulb suction in the prepectoral space   LOCAL MEDICATIONS USED:  NONE  SPECIMEN:  Source of Specimen:  right breast and axlnx3  DISPOSITION OF SPECIMEN:  PATHOLOGY  COUNTS:  YES  TOURNIQUET:  * No tourniquets in log *  DICTATION: .Dragon Dictation After informed consent was obtained the patient was brought to the operating room and placed in the supine position on the operative table. After adequate induction of general anesthesia the patient's right chest breast and axilla were prepped with DuraPrep, allowed to dry, and draped in usual sterile manner. Earlier in the day the patient underwent injection of 1 mCi of technetium sulfur colloid in the subareolar position. At this point, 2 cc of methylene blue and 3 cc of injectable saline were also injected in the subareolar position and the breast was massaged for several minutes. An elliptical incision was mapped out around the nipple and areola complex to minimize the excess skin. The incision was made along the map outlines with a 10 blade knife. This incision was carried down through the skin and subcutaneous tissue sharply with electrocautery until the breast tissue was entered. Skin edges were used to elevate the skin flaps anteriorly towards the ceiling. Thin skin flaps between the subcutaneous fat and the breast fat were created circumferentially around the incision until the dissection  reached the chest where a superiorly medially and inferiorly. Laterally the dissection was carried down to the latissimus muscle. Once this was accomplished we were able to use the neoprobe to identify a hot spot in the right axilla. We used blunt tonsil dissectionand sharp dissection with the electrocautery to identify and remove 3 hot blue lymph nodes. These were sent to pathology for touch preps and touch preps of these were negative. Next the breast was removed from the pectoralis muscle the pectoralis fascia by sharp Bovie dissection starting at the top of the breast. Once the breast was removed from the patient it was oriented with a stitch on the lateral aspect. It was then sent to pathology for further evaluation. Hemostasis was achieved using the Bovie electrocautery. The wound was irrigated with copious amounts of saline. The skin flaps appeared to be healthy. 2 small stab incisions were made with a 15 blade knife and inferior to the operative bed near the anterior axillary line. A tonsil clamp was placed to each opening and into the operative bed and used to bring 19 [place] round Blake drains into the operative bed. The lateral drain was placed in the axilla and the medial drain was placed along the chest wall. The drains were anchored to the skin with 3-0 nylon stitches. The superior and inferior skin flap were grossly reapproximated with interrupted 3-0 Vicryl stitches. The skin was then closed with a running 4-0 Monocryl subcuticular stitch. Dermabond dressings were applied. The drains were placed to bulb suction. The drains had a good seal. Sterile dressings were then  applied. The patient tolerated the procedure well. At the end of the case the needle sponge and instrument counts were correct. The patient was then awakened and taken to recovery in stable condition.  PLAN OF CARE: Admit for overnight observation  PATIENT DISPOSITION:  PACU - hemodynamically stable.   Delay start of  Pharmacological VTE agent (>24hrs) due to surgical blood loss or risk of bleeding: yes

## 2011-10-14 NOTE — H&P (View-Only) (Signed)
Patient ID: [name], female   DOB: 12/03/1975, 35 y.o.   MRN: [id number] Patient with further bloody drain output.  Will take emergently to OR for exploration of mastectomy wound. Procedure, risks, and benefits D/W patient. She agrees.  Cristol Engdahl, MD, MPH, FACS Pager: [phone]  

## 2011-10-14 NOTE — Progress Notes (Signed)
Patient ID: [name], female   DOB: 06-May-1976, 36 y.o.   MRN: [id number] JP output has been high: 120 and 140 plus another 40 in each over the last 2 hours.  No chest hematoma.  I removed pink binder and placed ace wrap for compression.  Will need to go to the OR if this does not stop.  Post op Hb only 9.  Will make NPO and hold lovenox.  I D/W patient.  Violeta Gelinas, MD, MPH, FACS Pager: [phone]

## 2011-10-14 NOTE — Progress Notes (Signed)
JP's emptied for 140cc and 120cc since 1400 arrival on 5100, PACU had emptied 70 and 50.  Dr Janee Morn informed of JP output and H/H from 1513 today, no new orders, will follow JP output and check in 2 hours and notify MD of output.

## 2011-10-14 NOTE — Interval H&P Note (Signed)
History and Physical Interval Note:  As above.  Will explore for bleeding.  Violeta Gelinas, MD, MPH, FACS Pager: [phone]

## 2011-10-14 NOTE — Transfer of Care (Signed)
Immediate Anesthesia Transfer of Care Note  Patient: [name]  Procedure(s) Performed: Procedure(s) (LRB): MASTECTOMY WITH SENTINEL LYMPH NODE BIOPSY (Right)  Patient Location: PACU  Anesthesia Type: General  Level of Consciousness: sedated and patient cooperative  Airway & Oxygen Therapy: Patient Spontanous Breathing  Post-op Assessment: Post -op Vital signs reviewed and stable  Post vital signs: Reviewed and stable  Complications: No apparent anesthesia complications

## 2011-10-14 NOTE — Anesthesia Postprocedure Evaluation (Signed)
Anesthesia Post Note  Patient: [name]  Procedure(s) Performed: Procedure(s) (LRB): MASTECTOMY WITH SENTINEL LYMPH NODE BIOPSY (Right)  Anesthesia type: general  Patient location: PACU  Post pain: Pain level controlled  Post assessment: Patient's Cardiovascular Status Stable  Last Vitals:  Filed Vitals:   10/14/11 1005  BP:   Pulse:   Temp: 36.1 C  Resp:     Post vital signs: Reviewed and stable  Level of consciousness: sedated  Complications: No apparent anesthesia complications

## 2011-10-14 NOTE — Anesthesia Procedure Notes (Addendum)
Performed by: Malachi Pro   Procedure Name: Intubation Date/Time: 10/14/2011 7:51 AM Performed by: Malachi Pro Pre-anesthesia Checklist: Patient identified, Emergency Drugs available, Suction available and Patient being monitored Patient Re-evaluated:Patient Re-evaluated prior to inductionOxygen Delivery Method: Circle system utilized Preoxygenation: Pre-oxygenation with 100% oxygen Intubation Type: IV induction Ventilation: Mask ventilation without difficulty Laryngoscope Size: Miller and 2 Grade View: Grade II Tube size: 7.0 mm Number of attempts: 1 Airway Equipment and Method: Stylet Placement Confirmation: ETT inserted through vocal cords under direct vision,  positive ETCO2,  CO2 detector and breath sounds checked- equal and bilateral Secured at: 22 cm Tube secured with: Tape Dental Injury: Teeth and Oropharynx as per pre-operative assessment

## 2011-10-14 NOTE — Anesthesia Procedure Notes (Signed)
Procedure Name: Intubation Date/Time: 10/14/2011 10:37 PM Performed by: Alanda Amass Pre-anesthesia Checklist: Patient identified, Timeout performed, Emergency Drugs available, Suction available and Patient being monitored Patient Re-evaluated:Patient Re-evaluated prior to inductionOxygen Delivery Method: Circle system utilized Preoxygenation: Pre-oxygenation with 100% oxygen Intubation Type: IV induction, Rapid sequence and Cricoid Pressure applied Laryngoscope Size: Mac and 3 Grade View: Grade I Tube type: Oral Tube size: 7.5 mm Number of attempts: 1 Airway Equipment and Method: Stylet Dental Injury: Teeth and Oropharynx as per pre-operative assessment

## 2011-10-14 NOTE — H&P (View-Only) (Signed)
Subjective:     Patient ID: [name], female   DOB: 08/04/1976, 36 y.o.   MRN: [id number]  HPI The patient is a 36-year-old white female with a known cancer in the upper outer right breast. It originally measured about 5-1/2 cm. She has been receiving neoadjuvant chemotherapy. Her chemotherapy was stopped in early January secondary to problems with her feet. She recently had a followup MRI study which showed the main tumor to have shrunk but there is enhancement linearly off the anterior surface of the tumor. The entire area measures approximately 6 cm. She is having some soreness in the upper outer right breast  Review of Systems  Constitutional: Negative.   HENT: Negative.   Eyes: Negative.   Respiratory: Negative.   Cardiovascular: Negative.   Gastrointestinal: Negative.   Genitourinary: Negative.   Musculoskeletal: Negative.   Skin: Negative.   Neurological: Negative.   Hematological: Negative.   Psychiatric/Behavioral: Negative.        Objective:   Physical Exam  Constitutional: She is oriented to person, place, and time. She appears well-developed and well-nourished.  HENT:  Head: Normocephalic and atraumatic.  Eyes: Conjunctivae and EOM are normal. Pupils are equal, round, and reactive to light.  Neck: Normal range of motion. Neck supple.  Cardiovascular: Normal rate, regular rhythm and normal heart sounds.   Pulmonary/Chest: Effort normal and breath sounds normal.       Small palpable mass in the upper outer right breast only when she is lying on her left side. No palpable axillary supraclavicular or cervical lymphadenopathy  Abdominal: Soft. Bowel sounds are normal. She exhibits no mass. There is no tenderness.  Musculoskeletal: Normal range of motion.  Lymphadenopathy:    She has no cervical adenopathy.  Neurological: She is alert and oriented to person, place, and time.  Skin: Skin is warm and dry.  Psychiatric: She has a normal mood and affect. Her behavior is  normal.       Assessment:     The patient has a large cancer in the upper outer right breast. It does seem as though the tumor has responded to chemotherapy but the entire measured area of involvement has not gotten any smaller. Because of this I think she would probably be best served with a mastectomy. She would also be a candidate for several node mapping. I've discussed with her in detail the risks and benefits of the operation to this as well as some of the technical aspects and she understands and wishes to proceed.    Plan:     Plan for right mastectomy and sentinel node mapping      

## 2011-10-15 LAB — CBC
HCT: 24.3 % — ABNORMAL LOW (ref 36.0–46.0)
Hemoglobin: 7.9 g/dL — ABNORMAL LOW (ref 12.0–15.0)
MCH: 27.4 pg (ref 26.0–34.0)
MCHC: 32.5 g/dL (ref 30.0–36.0)
MCV: 84.4 fL (ref 78.0–100.0)
Platelets: 235 10*3/uL (ref 150–400)
RBC: 2.88 MIL/uL — ABNORMAL LOW (ref 3.87–5.11)
RDW: 14 % (ref 11.5–15.5)
WBC: 7 10*3/uL (ref 4.0–10.5)

## 2011-10-15 NOTE — Progress Notes (Signed)
Both of pt's JPs immediately started to fill again after just being emptied for 95cc each.  Also, bloody drainage from dressing.  Notified MD on call, Dr. Johney Maine MD, get patient ready to go to OR for exploration of site.

## 2011-10-15 NOTE — Progress Notes (Signed)
1 Day Post-Op  Subjective: Events of last night noted. No complaints today other than some soreness. Drain output is low  Objective: Vital signs in last 24 hours: Temp:  [96.9 F (36.1 C)-98.3 F (36.8 C)] 98.3 F (36.8 C) (02/23 0546) Pulse Rate:  [82-115] 82  (02/23 0546) Resp:  [12-18] 18  (02/23 0546) BP: (107-131)/(46-91) 131/70 mmHg (02/23 0546) SpO2:  [98 %-100 %] 100 % (02/23 0546) Weight:  [317 lb 7.4 oz (144 kg)] 317 lb 7.4 oz (144 kg) (02/22 1345) Last BM Date: 10/13/11  Intake/Output from previous day: 02/22 0701 - 02/23 0700 In: 4386 [P.O.:120; I.V.:3446; Blood:700] Out: 1211 [Urine:400; Drains:736; Blood:75] Intake/Output this shift:    Chest wall: dressing intact. skin appears healthy. drain output low  Lab Results:   Basename 10/15/11 0300 10/14/11 2223 10/14/11 1513  WBC 7.0 -- 13.5*  HGB 7.9* 8.2* --  HCT 24.3* 24.0* --  PLT 235 -- 292   BMET  Basename 10/14/11 2225 10/14/11 2223 10/14/11 1513  NA 137 141 --  K 3.7 3.8 --  CL 102 -- --  CO2 27 -- --  GLUCOSE 121* 123* --  BUN 9 -- --  CREATININE 0.68 -- 0.70  CALCIUM 8.8 -- --   PT/INR No results found for this basename: LABPROT:2,INR:2 in the last 72 hours ABG No results found for this basename: PHART:2,PCO2:2,PO2:2,HCO3:2 in the last 72 hours  Studies/Results: Nm Sentinel Node Inj-no Rpt (breast)  10/14/2011  CLINICAL DATA: right breast cancer   Sulfur colloid was injected intradermally by the nuclear medicine  technologist for breast cancer sentinel node localization.      Anti-infectives: Anti-infectives     Start     Dose/Rate Route Frequency Ordered Stop   10/14/11 0621   ceFAZolin (ANCEF) 2-3 GM-% IVPB SOLR     Comments: BREWER, JULIA: cabinet override         10/14/11 0621 10/14/11 0756          Assessment/Plan: s/p Procedure(s) (LRB): WOUND EXPLORATION (Right) keep another day to make sure there is no more bleeding. Hold lovenox  LOS: 1 day    TOTH III,Lorey Pallett  S 10/15/2011

## 2011-10-16 LAB — CBC
HCT: 24.4 % — ABNORMAL LOW (ref 36.0–46.0)
Hemoglobin: 7.8 g/dL — ABNORMAL LOW (ref 12.0–15.0)
MCH: 27.6 pg (ref 26.0–34.0)
MCV: 86.2 fL (ref 78.0–100.0)
Platelets: 235 10*3/uL (ref 150–400)
RBC: 2.83 MIL/uL — ABNORMAL LOW (ref 3.87–5.11)
WBC: 5.5 10*3/uL (ref 4.0–10.5)

## 2011-10-16 LAB — DIFFERENTIAL
Eosinophils Relative: 5 % (ref 0–5)
Lymphocytes Relative: 27 % (ref 12–46)
Lymphs Abs: 1.5 10*3/uL (ref 0.7–4.0)
Monocytes Relative: 8 % (ref 3–12)

## 2011-10-16 MED ORDER — HYDROCODONE-ACETAMINOPHEN 5-325 MG PO TABS: 1.0000 | ORAL_TABLET | ORAL | Status: AC | PRN

## 2011-10-16 NOTE — Discharge Summary (Signed)
Physician Discharge Summary  Patient ID: [name] MRN: [id number] DOB/AGE: 36-Mar-1977 36 y.o.  Admit date: 10/14/2011 Discharge date: 10/16/2011  Admission Diagnoses:  Discharge Diagnoses:  Active Problems:  * No active hospital problems. *    Discharged Condition: good  Hospital Course: The pt had a large right breast cancer. She underwent right mastectomy and sentinel node mapping. She tolerated this well. She developed bleeding at her operative site the night after surgery and had to return to the or. She has been stable since with no further sign of bleeding. She is ready for discharge today. She will keep her drains in and return in 1 week  Consults: None  Significant Diagnostic Studies: none  Treatments: surgery: right mastectomy and sentinel node biopsy  Discharge Exam: Blood pressure 126/73, pulse 92, temperature 97.8 F (36.6 C), temperature source Oral, resp. rate 18, height 5\' 7"  (1.702 m), weight 317 lb 7.4 oz (144 kg), last menstrual period 10/14/2011, SpO2 100.00%. Chest wall: skin flaps appear healthy. drain output serosang  Disposition: 01-Home or Self Care  Discharge Orders    Future Appointments: Provider: Department: Dept Phone: Center:   11/17/2011 2:00 PM Krista Blue [facility] [phone] None   11/17/2011 2:30 PM Lowella Dell, MD [facility] [phone] None   01/17/2012 3:00 PM Esmeralda Arthur, MD [facility] [phone] None     Medication List  As of 10/16/2011  5:03 AM   ASK your doctor about these medications         amitriptyline 25 MG tablet   Commonly known as: ELAVIL   Take 25 mg by mouth at bedtime.      benazepril-hydrochlorthiazide 20-12.5 MG per tablet   Commonly known as: LOTENSIN HCT   Take 1 tablet by mouth 2 (two) times daily.      FERRO-BOB 325 (65 FE) MG tablet   Generic drug: ferrous sulfate   Take 325 mg by mouth daily with breakfast.      fluconazole 100 MG tablet   Commonly known as: DIFLUCAN   Take  100 mg by mouth daily as needed. For infection      HYDROcodone-acetaminophen 5-325 MG per tablet   Commonly known as: NORCO   Take 1 tablet by mouth every 6 (six) hours as needed. pain      pantoprazole 40 MG tablet   Commonly known as: PROTONIX   Take 40 mg by mouth daily.      potassium chloride SA 20 MEQ tablet   Commonly known as: K-DUR,KLOR-CON   Take 1 tablet (20 mEq total) by mouth 2 (two) times daily.      ranitidine 15 MG/ML syrup   Commonly known as: ZANTAC   Take 50 mg by mouth 2 (two) times daily as needed. For upset stomach        senna-docusate 8.6-50 MG per tablet   Commonly known as: Senokot-S   Take 1-2 tablets by mouth at bedtime as needed. For constipation      SUPER B COMPLEX PO   Take 1 tablet by mouth daily.             Signed: Griselda Miner S 10/16/2011, 5:03 AM

## 2011-10-16 NOTE — Progress Notes (Signed)
Pt discharged to home accomp by family.  DC instructions given to pt (see copy in chart).  Rx had been given to pt by Dr.  Rock Nephew given instruction sheets on ABC class (once approved by MD), JP drain emptying record and instructions, support groups and info on stores that have post mastectomy resources. Reviewed arm precautions with pt.   Pt understands what to call for and to call for FU appt with Dr. Carolynne Edouard, pt will call tom am to make appt.  Demonstrated to dtr and husband how to empty JP drains, pt understands to call for any bright red bleeding.

## 2011-10-16 NOTE — Progress Notes (Signed)
2 Days Post-Op  Subjective: No complaints. Soreness is improving  Objective: Vital signs in last 24 hours: Temp:  [97.8 F (36.6 C)-98.9 F (37.2 C)] 97.8 F (36.6 C) (02/23 2154) Pulse Rate:  [82-94] 92  (02/23 2154) Resp:  [18-20] 18  (02/23 2154) BP: (116-131)/(70-84) 126/73 mmHg (02/23 2154) SpO2:  [98 %-100 %] 100 % (02/23 2154) Last BM Date: 10/14/11  Intake/Output from previous day: 02/23 0701 - 02/24 0700 In: 2073 [P.O.:480; I.V.:1593] Out: 1020 [Urine:800; Drains:220] Intake/Output this shift: Total I/O In: 1593 [I.V.:1593] Out: 500 [Urine:400; Drains:100]  Chest wall: skin appears healthy. drain output serosang  Lab Results:   Basename 10/15/11 0300 10/14/11 2223 10/14/11 1513  WBC 7.0 -- 13.5*  HGB 7.9* 8.2* --  HCT 24.3* 24.0* --  PLT 235 -- 292   BMET  Basename 10/14/11 2225 10/14/11 2223 10/14/11 1513  NA 137 141 --  K 3.7 3.8 --  CL 102 -- --  CO2 27 -- --  GLUCOSE 121* 123* --  BUN 9 -- --  CREATININE 0.68 -- 0.70  CALCIUM 8.8 -- --   PT/INR No results found for this basename: LABPROT:2,INR:2 in the last 72 hours ABG No results found for this basename: PHART:2,PCO2:2,PO2:2,HCO3:2 in the last 72 hours  Studies/Results: Nm Sentinel Node Inj-no Rpt (breast)  10/14/2011  CLINICAL DATA: right breast cancer   Sulfur colloid was injected intradermally by the nuclear medicine  technologist for breast cancer sentinel node localization.      Anti-infectives: Anti-infectives     Start     Dose/Rate Route Frequency Ordered Stop   10/14/11 0621   ceFAZolin (ANCEF) 2-3 GM-% IVPB SOLR     Comments: BREWER, JULIA: cabinet override         10/14/11 0621 10/14/11 0756          Assessment/Plan: s/p Procedure(s) (LRB): WOUND EXPLORATION (Right) Discharge teach pt drain care  LOS: 2 days    TOTH III,Arantza Darrington S 10/16/2011

## 2011-10-17 ENCOUNTER — Ambulatory Visit: Payer: Medicaid Other | Admitting: Radiation Oncology

## 2011-10-17 ENCOUNTER — Ambulatory Visit: Payer: Medicaid Other

## 2011-10-17 LAB — POCT I-STAT 4, (NA,K, GLUC, HGB,HCT)
Glucose, Bld: 131 mg/dL — ABNORMAL HIGH (ref 70–99)
Glucose, Bld: 132 mg/dL — ABNORMAL HIGH (ref 70–99)
HCT: 27 % — ABNORMAL LOW (ref 36.0–46.0)
HCT: 27 % — ABNORMAL LOW (ref 36.0–46.0)
Hemoglobin: 9.2 g/dL — ABNORMAL LOW (ref 12.0–15.0)
Hemoglobin: 9.2 g/dL — ABNORMAL LOW (ref 12.0–15.0)
Potassium: >9 meq/L (ref 3.5–5.1)
Potassium: >9 meq/L (ref 3.5–5.1)
Sodium: 138 meq/L (ref 135–145)
Sodium: 138 meq/L (ref 135–145)

## 2011-10-17 LAB — TYPE AND SCREEN

## 2011-10-18 ENCOUNTER — Telehealth (INDEPENDENT_AMBULATORY_CARE_PROVIDER_SITE_OTHER): Payer: Self-pay

## 2011-10-18 ENCOUNTER — Other Ambulatory Visit: Payer: Self-pay | Admitting: Physician Assistant

## 2011-10-18 ENCOUNTER — Encounter (HOSPITAL_COMMUNITY): Payer: Self-pay | Admitting: General Surgery

## 2011-10-18 NOTE — Telephone Encounter (Signed)
The patient called in concerned because her drainage into the jp bulb s/p mastectomy was so thick she couldn't get it out of the bulb.  I told her as long as it is draining it is ok and to watch for signs of infection, redness and swelling or if she has fever.  She can try to milk the line once a day.  She understands.  The appointment was made for follow up on March 4th.

## 2011-10-19 NOTE — Telephone Encounter (Signed)
Further refills should be through patient's PCP

## 2011-10-20 ENCOUNTER — Encounter: Payer: Self-pay | Admitting: *Deleted

## 2011-10-24 ENCOUNTER — Encounter (INDEPENDENT_AMBULATORY_CARE_PROVIDER_SITE_OTHER): Payer: Self-pay | Admitting: General Surgery

## 2011-10-24 ENCOUNTER — Ambulatory Visit (INDEPENDENT_AMBULATORY_CARE_PROVIDER_SITE_OTHER): Payer: Medicaid Other | Admitting: General Surgery

## 2011-10-24 VITALS — BP 130/84 | HR 100 | Temp 98.2°F | Ht 67.0 in | Wt 321.0 lb

## 2011-10-24 DIAGNOSIS — C50919 Malignant neoplasm of unspecified site of unspecified female breast: Secondary | ICD-10-CM

## 2011-10-24 DIAGNOSIS — K219 Gastro-esophageal reflux disease without esophagitis: Secondary | ICD-10-CM | POA: Insufficient documentation

## 2011-10-24 NOTE — Progress Notes (Signed)
Subjective:     Patient ID: [name], female   DOB: 03-30-1976, 36 y.o.   MRN: [id number]  HPI The patient is a 36 year old black female who is now one week out from a right mastectomy and sentinel node mapping. Her postoperative course was complicated by a significant bleed that required a return to the OR tonight after surgery. She has had no further bleeding. She is at about 30-35 cc of each drain. Her soreness is gradually improving.  Review of Systems     Objective:   Physical Exam On exam her right mastectomy incision is healing nicely. There is no evidence of seroma or hematoma. Her chest wall drain was removed without difficulty and she tolerated this well. The axillary drain was left in place.    Assessment:     Status post right mastectomy and sentinel node mapping.    Plan:     Overall she's doing well. She had one lymph node with a micromet and it and one lymph node with some isolated tumor cells. A third lymph node was negative. Since none of the lymph nodes had a macromet in them I do not believe she needs a completion node dissection. We will see her back in one week to try to remove the next drain.

## 2011-10-24 NOTE — Patient Instructions (Signed)
Continue to sponge bathe while drain is in Keep track of output

## 2011-10-24 NOTE — Progress Notes (Signed)
Copy of note given to [facility] for office visit today.

## 2011-11-01 ENCOUNTER — Encounter (INDEPENDENT_AMBULATORY_CARE_PROVIDER_SITE_OTHER): Payer: Self-pay | Admitting: General Surgery

## 2011-11-01 ENCOUNTER — Ambulatory Visit (INDEPENDENT_AMBULATORY_CARE_PROVIDER_SITE_OTHER): Payer: Medicaid Other | Admitting: General Surgery

## 2011-11-01 VITALS — BP 130/90 | HR 100 | Temp 97.6°F | Resp 18 | Ht 67.0 in | Wt 313.2 lb

## 2011-11-01 DIAGNOSIS — C50919 Malignant neoplasm of unspecified site of unspecified female breast: Secondary | ICD-10-CM

## 2011-11-01 NOTE — Patient Instructions (Signed)
May shower on Wednesday morning

## 2011-11-02 ENCOUNTER — Encounter (INDEPENDENT_AMBULATORY_CARE_PROVIDER_SITE_OTHER): Payer: Self-pay | Admitting: General Surgery

## 2011-11-02 NOTE — Progress Notes (Signed)
Subjective:     Patient ID: [name], female   DOB: 11/29/1975, 36 y.o.   MRN: [id number]  HPI The patient is a 36 year old black female who is now about 2 weeks out from a right mastectomy and sentinel node biopsy for a large right breast cancer. She has had minimal drainage out of the second drain. She denies any pain.  Review of Systems     Objective:   Physical Exam On exam her right mastectomy incision is healing nicely with no sign of infection. The remaining drain was removed without difficulty and she tolerated this well. The staples were also removed.   Assessment:     2 weeks status post right mastectomy and sentinel node biopsy    Plan:     At this point she seems to be doing well. We will plan to see her back in one week to check for any formation of seroma. She will also follow up with the medical and radiation oncologist to decide on any further adjuvant therapy.

## 2011-11-05 NOTE — Progress Notes (Signed)
Follow up new consultation for consideration of radiation s/p chemotherapy and right mastectomy 36 y.o. black female, married with 2 daughters.

## 2011-11-07 ENCOUNTER — Ambulatory Visit: Admission: RE | Admit: 2011-11-07 | Payer: Medicaid Other | Source: Ambulatory Visit

## 2011-11-07 ENCOUNTER — Encounter: Payer: Self-pay | Admitting: *Deleted

## 2011-11-07 ENCOUNTER — Telehealth: Payer: Self-pay | Admitting: *Deleted

## 2011-11-07 ENCOUNTER — Ambulatory Visit: Admission: RE | Admit: 2011-11-07 | Payer: Medicaid Other | Source: Ambulatory Visit | Admitting: Radiation Oncology

## 2011-11-07 ENCOUNTER — Encounter: Payer: Self-pay | Admitting: Radiation Oncology

## 2011-11-07 DIAGNOSIS — G629 Polyneuropathy, unspecified: Secondary | ICD-10-CM | POA: Insufficient documentation

## 2011-11-07 HISTORY — DX: Polyneuropathy, unspecified: G62.9

## 2011-11-07 NOTE — Telephone Encounter (Signed)
Called pt ,she thought her appt was at 13 0pm, sounded like I woke her up, informed her it was at 1230,will need to reschedule,transferred her call to [place] to reschedule 1:19 PM

## 2011-11-09 ENCOUNTER — Encounter: Payer: Self-pay | Admitting: *Deleted

## 2011-11-09 ENCOUNTER — Encounter: Payer: Self-pay | Admitting: Radiation Oncology

## 2011-11-09 ENCOUNTER — Ambulatory Visit
Admission: RE | Admit: 2011-11-09 | Discharge: 2011-11-09 | Disposition: A | Discharge status: Home / Self Care | Payer: Medicaid Other | Source: Ambulatory Visit | Attending: Radiation Oncology | Admitting: Radiation Oncology

## 2011-11-09 ENCOUNTER — Encounter: Payer: Self-pay | Admitting: Oncology

## 2011-11-09 VITALS — BP 123/85 | HR 99 | Temp 98.0°F | Wt 317.3 lb

## 2011-11-09 DIAGNOSIS — Z510 Encounter for antineoplastic radiation therapy: Secondary | ICD-10-CM | POA: Insufficient documentation

## 2011-11-09 DIAGNOSIS — C50919 Malignant neoplasm of unspecified site of unspecified female breast: Secondary | ICD-10-CM | POA: Insufficient documentation

## 2011-11-09 DIAGNOSIS — C773 Secondary and unspecified malignant neoplasm of axilla and upper limb lymph nodes: Secondary | ICD-10-CM | POA: Insufficient documentation

## 2011-11-09 DIAGNOSIS — Z9010 Acquired absence of unspecified breast and nipple: Secondary | ICD-10-CM | POA: Insufficient documentation

## 2011-11-09 NOTE — Progress Notes (Signed)
Arrived for Nurse Eval appointment

## 2011-11-09 NOTE — Progress Notes (Signed)
CC:   [name], M.D. [name]. [name], M.D.  REFERRING PHYSICIAN:  [name], M.D.  DIAGNOSIS:  Locally advanced right breast cancer (ypT2 ypN1 Mi).  HISTORY OF PRESENT ILLNESS:  [name] is a very pleasant 36 year old female who is seen for further evaluation.  She was initially seen by myself in the Multidisciplinary Breast Clinic on March 23, 2011.  The patient was found have an approximately 5 x 5 x 2.6 x 3.7 cm tumor (MRI measurements).  The patient was felt to be a good candidate at that time for neoadjuvant treatment in an attempt to proceed with breast- conserving therapy.  The patient was initially treated with 4 cycles of dose-dense doxorubicin and cyclophosphamide.  The patient then proceeded with 8 cycles paclitaxel.  The patient was originally scheduled to receive 12 cycles but, in light of neuropathy, this was stopped early. On MRI, the patient had minimal response to her therapy.  In light of this, the patient proceeded to undergo a simple mastectomy as well as sentinel node procedure on April 12, 2012.  Upon pathologic review, the patient was found have a residual 4.3 cm poorly differentiated invasive ductal carcinoma.  The surgical margins were clear with the closest margin being anterior soft tissue margin at 0.5 cm.  From the patient's sentinel node procedure, she was found to have 1 lymph node positive for micrometastasis.  A 2nd lymph node showed isolated tumor cells. Patient's 3rd sentinel node showed no evidence of malignancy.  The patient has been healing well since her surgery.  She continues to have significant problems with right arm and shoulder mobility.  The patient is now seen in Radiation Oncology for consideration for postmastectomy irradiation.  REVIEW OF SYSTEMS:  As above.  The patient has tightness along the right chest wall area and limited arm mobility.  She, however, is not requiring any pain medication at this time.  The  patient denies any new bony pain, headaches, dizziness or blurred vision.  PHYSICAL EXAMINATION:  General:  This is a very pleasant young 36 year old female in no acute distress.  Vital signs:  Temperature 98, pulse 99, blood pressure is 123/85.  Weight is 317 pounds.  Lungs: Examination of the lungs reveals them to be clear.  Heart:  The heart has a regular rhythm and rate.  Chest wall:  Examination of the right chest wall area reveals well-healing mastectomy scar.  There is no active drainage or signs of infection within the chest wall area.  There is no palpable axillary or supraclavicular adenopathy.  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  As above, the patient presented with clinical T3 lesion.  She had minimal response to her neoadjuvant treatment and did require mastectomy.  Given the above findings, I would recommend postmastectomy irradiation for [name].  In addition, given the fact that she did have metastatic spread into the axillary region without a formal axillary dissection, I would also recommend coverage of the axillary and supraclavicular region with her radiation treatment.  I discussed the overall treatment course, side effects and potential toxicities of radiation therapy in this situation with [name].  She appears to understand and wishes to proceed with planned course of treatment.  The patient will return in approximately 2 weeks for her CT simulation with treatment to begin approximately 3 weeks from now.  I have given the patient some exercises to do to help with her right  arm and shoulder mobility.  The patient will receive approximately 5 weeks of radiation therapy directed at the right chest wall, axillary and supraclavicular region.  The mastectomy scar area will be boosted further to a cumulative dose of approximately 6000 cGy.    ______________________________ [name], Ph.D., M.D. JDK/MEDQ  D:  11/09/2011  T:  11/09/2011  Job:  [id number]

## 2011-11-09 NOTE — Progress Notes (Signed)
Spoke with patient about getting some assistance for rent. I advised patient that she would need to apply for financial assistance and once she got approved we would sit down and discuss getting her help. Her husband is working she is currently not working I did explain to her that we would need his income information.

## 2011-11-09 NOTE — Progress Notes (Signed)
Clinical Social Work received referral per patient request. CSW met with patient in radonc exam room. The patient appeared somewhat tearful.  CSW reviewed distress thermometer with patient and patient shared she felt that she was doing very well but is recently worried regarding her finances. She states she owes $875 in rent and has no way to pay this month. CSW encouraged patient to meet with a financial counselor following her MD appointment and contact Tamala Julian, LCSW, for financial resources. CSW explained financial resources would not be an immediate solution to her problem. CSW spoke with Merla Riches and Terald Sleeper, financial advocates, regarding the situation and will send referral to [facility], LCSW.  Kathrin Penner, MSW, [facility] Clinical Social Worker [facility] [phone]

## 2011-11-09 NOTE — Progress Notes (Signed)
Consult today.  Mastectomy right breast 10/14/11.  Denies any pain.  Drain removed last week.  Guaze dressing in place, but denies any drainage.

## 2011-11-14 ENCOUNTER — Other Ambulatory Visit: Payer: Self-pay | Admitting: Oncology

## 2011-11-14 ENCOUNTER — Encounter: Payer: Self-pay | Admitting: Oncology

## 2011-11-14 NOTE — Progress Notes (Signed)
Called [name] to follow-up with her from the conversation she had with Alcario Drought and to see if she return all paperwork in with [name] (Rad Onc).  No answer, left message to return call.

## 2011-11-14 NOTE — Progress Notes (Signed)
Patient application was laying in my chair, I processed application, there was no information about the rent amount or proof of rent amount attached to application Darlena called patient at [phone] left a message, we also tried to call her on cell number [phone] phone was not in service, we did call husband's cell number [phone] and we did speak with him, we asked that he have Ms. Blixt give [place] a call when she can. Patient approved for 100% EPP discount, for family of 4 income [phone]

## 2011-11-15 ENCOUNTER — Telehealth: Payer: Self-pay | Admitting: Oncology

## 2011-11-15 ENCOUNTER — Other Ambulatory Visit: Payer: Self-pay | Admitting: Physician Assistant

## 2011-11-15 ENCOUNTER — Encounter: Payer: Self-pay | Admitting: Oncology

## 2011-11-15 ENCOUNTER — Ambulatory Visit (INDEPENDENT_AMBULATORY_CARE_PROVIDER_SITE_OTHER): Payer: Medicaid Other | Admitting: General Surgery

## 2011-11-15 ENCOUNTER — Encounter (INDEPENDENT_AMBULATORY_CARE_PROVIDER_SITE_OTHER): Payer: Self-pay | Admitting: General Surgery

## 2011-11-15 VITALS — BP 130/86 | HR 88 | Temp 96.8°F | Resp 16 | Ht 67.0 in | Wt 317.0 lb

## 2011-11-15 DIAGNOSIS — K219 Gastro-esophageal reflux disease without esophagitis: Secondary | ICD-10-CM

## 2011-11-15 DIAGNOSIS — C50919 Malignant neoplasm of unspecified site of unspecified female breast: Secondary | ICD-10-CM

## 2011-11-15 NOTE — Telephone Encounter (Signed)
Patient called in to give address to land lord for rent to be paid , I explained to patient that we had to first see if grant money was available to pay rent.

## 2011-11-15 NOTE — Progress Notes (Signed)
After several attempts to reach patient, patient did return phone calls today, I advised patient to please bring in rent verification/proof so that we could process payment for her. She did not bring information in when she brought back application.

## 2011-11-15 NOTE — Progress Notes (Signed)
Subjective:     Patient ID: [name], female   DOB: 18-Aug-1976, 36 y.o.   MRN: [id number]  HPI The patient is a 36 year old black female female who is about 3 weeks status post right mastectomy. Her last drain was removed last week. She is having very little soreness. She has met with the radiation oncologist and her plan is to start postmastectomy radiation in April.  Review of Systems     Objective:   Physical Exam On exam her right mastectomy incision is healing nicely. Her skin flaps are healthy. There is no evidence of seroma.    Assessment:     Status post right mastectomy    Plan:     At this point we will refer her to the physical therapist's to help her with strength and range of motion of her shoulder. We will plan to see her back in one month.

## 2011-11-15 NOTE — Patient Instructions (Signed)
Ok to start Physical Therapy

## 2011-11-17 ENCOUNTER — Other Ambulatory Visit (HOSPITAL_BASED_OUTPATIENT_CLINIC_OR_DEPARTMENT_OTHER): Payer: Medicaid Other | Admitting: Lab

## 2011-11-17 ENCOUNTER — Ambulatory Visit (HOSPITAL_BASED_OUTPATIENT_CLINIC_OR_DEPARTMENT_OTHER): Payer: Medicaid Other | Admitting: Oncology

## 2011-11-17 VITALS — BP 125/87 | HR 90 | Temp 98.7°F | Ht 67.0 in | Wt 315.0 lb

## 2011-11-17 DIAGNOSIS — C50919 Malignant neoplasm of unspecified site of unspecified female breast: Secondary | ICD-10-CM

## 2011-11-17 LAB — CBC WITH DIFFERENTIAL/PLATELET
BASO%: 0.3 % (ref 0.0–2.0)
Basophils Absolute: 0 10*3/uL (ref 0.0–0.1)
EOS%: 2.6 % (ref 0.0–7.0)
HCT: 33.1 % — ABNORMAL LOW (ref 34.8–46.6)
HGB: 10.5 g/dL — ABNORMAL LOW (ref 11.6–15.9)
LYMPH%: 17.6 % (ref 14.0–49.7)
MCH: 26.3 pg (ref 25.1–34.0)
MCHC: 31.7 g/dL (ref 31.5–36.0)
MCV: 83 fL (ref 79.5–101.0)
MONO%: 7.3 % (ref 0.0–14.0)
NEUT%: 72.2 % (ref 38.4–76.8)
Platelets: 352 10*3/uL (ref 145–400)
lymph#: 1.3 10*3/uL (ref 0.9–3.3)

## 2011-11-17 LAB — COMPREHENSIVE METABOLIC PANEL
ALT: 23 U/L (ref 0–35)
AST: 23 U/L (ref 0–37)
BUN: 12 mg/dL (ref 6–23)
Calcium: 9.7 mg/dL (ref 8.4–10.5)
Chloride: 100 mEq/L (ref 96–112)
Creatinine, Ser: 0.66 mg/dL (ref 0.50–1.10)
Total Bilirubin: 0.3 mg/dL (ref 0.3–1.2)

## 2011-11-17 NOTE — Progress Notes (Signed)
ID: [name]   DOB: 11-22-1975  MR#: [id number]  YQM#:[id number]  HISTORY OF PRESENT ILLNESS: The patient developed pain in her right breast and brought it to her primary care physician's attention. Mammography at [facility] 03/10/2011 found the breasts to be heterogeneously dense. There was a mildly irregular mass posteriorly in the upper outer quadrant of the right breast. On ultrasound this was hypoechoic and measured approximately 2.4 cm, with irregular margins. The left breast was unremarkable.  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 Bilateral breast MRIs were obtained 03/18/2011, showing in the upper outer quadrant of the right breast an irregular mass measuring 5.5 cm. There was a suggestion of cortical thickening in the level I right axillary lymph node, which measured 1.2 cm. The patient was staged with chest CT scan and PET scan, which showed no distant disease. These studies did confirm the abnormality in the right breast as well as a hypermetabolic right axillary lymph node, making this a clinical T3 N1 or stage III invasive ductal carcinoma.  She completed neoadjuvant chemotherapy and proceeded to surgery and subsequent treatment as detailed below..  INTERVAL HISTORY: [name] returns today for followup of her breast cancer. She continues to recover from her surgery, but still is fatigued, getting up late, and staying up late as well. She tells me her children who are 87 and 12 have weathered this storm fairly well and are doing OK in school. She is looking forward to going back to work, but does not feel quite ready to do that yet. Possibly in May she will be able to.  REVIEW OF SYSTEMS: She is physically not very active and spends a good deal of time of resting, or sitting in front of the computer. She still has grade  1 peripheral neuropathy involving the feet, and at night it feels to her like "blood drains from my feet". If you become cold and numb, but not tingling or painful. Wearing a we'll sought helps minimally. Luckily she has no significant peripheral neuropathy in her hands. She had a menstrual period the day of her surgery but not since. She's not having hot flashes at present. A detailed review of systems was otherwise noncontributory.  PAST MEDICAL HISTORY: Past Medical History  Diagnosis Date  . Hypertension   . Night sweats   . Insomnia   . Breast pain   . Abdominal pain   . Neck pain   . Back pain   . Wears glasses   . Sinus complaint   . Sore throat   . Hoarseness of voice   . Heart palpitations   . Shortness of breath   . Nausea & vomiting   . Heartburn   . Ulcer of abdomen wall   . Headache     hx migraine in childhood  . Depression   . Anemia   . Cancer     rt breast  . Blood transfusion 08/2011  . GERD (gastroesophageal reflux disease)   . Status post chemotherapy     doxorubicin/cyclophosphamide  . Breast cancer 03/14/11    dx right breast=invasive ductal ca,dcis, ER/pr=positive  . Peripheral neuropathy     s/p chemotherapy  . Status post chemotherapy     Paclitaxel - Discontinued January 2013 due to neuropathy    PAST SURGICAL HISTORY: Past Surgical History  Procedure Date  .  Hip pins 36 y/o    pins placed in both hips   . Portacath placement   . Complete mastectomy w/ sentinel node biopsy 10/14/11    Dr. Carolynne Edouard  Right breast   . Breast surgery   . Mastectomy w/ sentinel node biopsy 10/14/2011    Procedure: MASTECTOMY WITH SENTINEL LYMPH NODE BIOPSY;  Surgeon: Robyne Askew, MD;  Location: [facility] OR;  Service: General;  Laterality: Right;  . Wound exploration -22-13    s/p R maste; burke thompson    FAMILY HISTORY Family History  Problem Relation Age of Onset  . Heart disease Father     heart attack  . Heart attack Father     died age 53  The patient's  mother is alive at age 37. The patient's father died from a myocardial infarction at the age of 8. She has one sister and one brother. There is no history of breast or ovarian cancer in the family.   GYNECOLOGIC HISTORY: Menarche age 10. She was premenopausal at the time of diagnosis. She has never used birth control pills. She is GX P2. First pregnancy to term age 43. She had her last period at the time of her surgery.  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 Her husband of 3 years, [name], works in Holiday representative. Daughter, [name], 16, and [name], 12, both live at home with the patient and her husband as does the patient's mother.    ADVANCED DIRECTIVES:  HEALTH MAINTENANCE: History  Substance Use Topics  . Smoking status: Former Games developer  . Smokeless tobacco: Never Used   Comment: quit 1998  . Alcohol Use: No     Colonoscopy:  PAP:  Bone density:  Lipid panel:  Allergies  Allergen Reactions  . Oxycodone Other (See Comments)    headache  . Tape     "Tears my skin up"    Current Outpatient Prescriptions  Medication Sig Dispense Refill  . B Complex-C (SUPER B COMPLEX PO) Take 1 tablet by mouth daily.      . benazepril-hydrochlorthiazide (LOTENSIN HCT) 20-12.5 MG per tablet Take 1 tablet by mouth 2 (two) times daily.      . ferrous sulfate (FERRO-BOB) 325 (65 FE) MG tablet Take 325 mg by mouth daily with breakfast.      . pantoprazole (PROTONIX) 40 MG tablet TAKE ONE TABLET BY MOUTH EVERY DAY  30 tablet  0  . senna-docusate (SENOKOT-S) 8.6-50 MG per tablet Take 1-2 tablets by mouth at bedtime as needed. For constipation      . amitriptyline (ELAVIL) 25 MG tablet Take 25 mg by mouth at bedtime.      . fluconazole (DIFLUCAN) 100 MG tablet Take 100 mg by mouth daily as needed. For infection      . DISCONTD: potassium chloride SA (K-DUR,KLOR-CON) 20 MEQ tablet Take 1 tablet (20 mEq total) by mouth 2 (two) times daily.  60 tablet  1  .  DISCONTD: ranitidine (ZANTAC) 15 MG/ML syrup Take 50 mg by mouth 2 (two) times daily as needed. For upset stomach         OBJECTIVE: Young African American woman who appears fatigued Filed Vitals:   11/17/11 1613  BP: 125/87  Pulse: 90  Temp: 98.7 F (37.1 C)     Body mass index is 49.34 kg/(m^2).    ECOG FS: 2  Sclerae unicteric Oropharynx clear No peripheral adenopathy Lungs no rales or rhonchi Heart regular rate and rhythm Abd  benign MSK no focal spinal tenderness, no peripheral edema Neuro: nonfocal Breasts: The right breast is status post mastectomy. There is no evidence of local recurrence. The left breast is unremarkable  LAB RESULTS: Lab Results  Component Value Date   WBC 7.3 11/17/2011   NEUTROABS 5.2 11/17/2011   HGB 10.5* 11/17/2011   HCT 33.1* 11/17/2011   MCV 83.0 11/17/2011   PLT 352 11/17/2011      Chemistry      Component Value Date/Time   NA 139 11/17/2011 1520   K 3.3* 11/17/2011 1520   CL 100 11/17/2011 1520   CO2 29 11/17/2011 1520   BUN 12 11/17/2011 1520   CREATININE 0.66 11/17/2011 1520      Component Value Date/Time   CALCIUM 9.7 11/17/2011 1520   ALKPHOS 108 11/17/2011 1520   AST 23 11/17/2011 1520   ALT 23 11/17/2011 1520   BILITOT 0.3 11/17/2011 1520       Lab Results  Component Value Date   LABCA2 26 03/23/2011    No components found with this basename: [id number]    No results found for this basename: INR:1;PROTIME:1 in the last 168 hours  Urinalysis    Component Value Date/Time   COLORURINE YELLOW 10/26/2010 0006   APPEARANCEUR CLOUDY* 10/26/2010 0006   LABSPEC 1.020 06/02/2011 1450   LABSPEC 1.029 10/26/2010 0006   PHURINE 6.0 10/26/2010 0006   GLUCOSEU NEGATIVE 10/26/2010 0006   HGBUR TRACE* 10/26/2010 0006   BILIRUBINUR NEGATIVE 10/26/2010 0006   KETONESUR NEGATIVE 10/26/2010 0006   PROTEINUR NEGATIVE 10/26/2010 0006   UROBILINOGEN 0.2 10/26/2010 0006   NITRITE NEGATIVE 10/26/2010 0006   LEUKOCYTESUR SMALL* 10/26/2010 0006    STUDIES: No new  results found. Left mammogram will be due late July of this year  ASSESSMENT: 36 year old BRCA 1-2 negative  woman   (1) status post right breast biopsy July of 2012 for a clinical T3 N1 (stage III) invasive ductal carcinoma, grade 3, 99% estrogen and  100% progesterone receptor positive, HER-2 negative, with an MIB-1 of 98%.   (2) Status post 4 cycles of dose dense doxorubicin and cyclophosphamide, followed by weekly paclitaxel x 8, completed 08/25/2011  (3) s/p right mastectomy and sentinel lymph node sampling 10/14/2011 for a residual ypT2 ypN1(mic) invasive ductal carcinoma, grade 3, with ample margins  (4) scheduled to start adjuvant radiation under Dr. Roselind Messier next week  PLAN: We discussed anti-estrogen therapy in detail. In her case, partly out of concern over the risk of clotting with tamoxifen, we are going to go with aromatase inhibitors, and since she is still perimenopausal we will add goserelin to her treatment. We discussed this at length today and she has a very good understanding of the possible toxicities, side effects and complications of goserelin. She will receive her first dose on April 9. She will see me in mid May. By then she should be just about done with her radiation treatments, and we will start her on anastrozole at that time. The goal will be to continue antiestrogen therapy for 5-10 years.   Cynda Soule C    11/17/2011

## 2011-11-21 ENCOUNTER — Telehealth: Payer: Self-pay | Admitting: Oncology

## 2011-11-21 ENCOUNTER — Other Ambulatory Visit: Payer: Self-pay | Admitting: Physician Assistant

## 2011-11-21 NOTE — Telephone Encounter (Signed)
Filled 11/15/11 by Zollie Scale, PA.  Refills to come from PCP, per her note.

## 2011-11-21 NOTE — Telephone Encounter (Signed)
After reviewing patients status of the need for rent to be paid and talking with the social workers we advised patient that at this time we do not have grant money for rent but if there is anything medically that she needs to let us know and we would assist her.

## 2011-11-22 ENCOUNTER — Encounter: Payer: Self-pay | Admitting: *Deleted

## 2011-11-22 ENCOUNTER — Ambulatory Visit: Admission: RE | Admit: 2011-11-22 | Payer: Medicaid Other | Source: Ambulatory Visit | Admitting: Radiation Oncology

## 2011-11-22 NOTE — Progress Notes (Signed)
Clinical Social Worker received referral from Artist and CSW [employer] for financial needs.  CSW contacted pt at home to asses for needs or concerns.  Pt stated her husband was the only one in the home that was currently working; which makes it difficult to pay rent.  Pt has previously met with the financial counselor, and would like to explore other financial resources.  CSW informed pt that there are multiple resources available that she could apply to for financial assistance.  CSW also provided pt with information on [facility] financial assistance and contact number to initiate the application process.  Pt verbalized understanding and stated she would call.  CSW and pt are also scheduled to meet on 11/29/11 to review resource applications and explore other needs or concerns.  CSW encouraged pt to call with any questions or concerns.  Tamala Julian, MSW, LCSW Clinical Social Worker [facility] [phone]

## 2011-11-29 ENCOUNTER — Encounter: Payer: Self-pay | Admitting: *Deleted

## 2011-11-29 NOTE — Progress Notes (Signed)
Clinical Social Worker met with pt in office at [facility] to offer support and review financial resources.  CSW and pt reviewed breast cancer programs that offer financial assistance.  CSW and pt gathered required documentation and noted information/documentation the pt still needed to obtain.  Pt plans to complete her statement of purpose required by applications and bring other documentation to [facility] on Thursday 12/01/11.  Once pt completes her statement and locates required information CSW will submit applications to [employer], [place], and [place] [employer] [place].  CSW encouraged pt to call with any questions or concerns.    Tamala Julian, MSW, LCSW Clinical Social Worker [facility] [phone]

## 2011-12-06 ENCOUNTER — Ambulatory Visit
Admission: RE | Admit: 2011-12-06 | Discharge: 2011-12-06 | Disposition: A | Discharge status: Home / Self Care | Payer: Medicaid Other | Source: Ambulatory Visit | Attending: Radiation Oncology | Admitting: Radiation Oncology

## 2011-12-06 DIAGNOSIS — C50919 Malignant neoplasm of unspecified site of unspecified female breast: Secondary | ICD-10-CM

## 2011-12-06 NOTE — Progress Notes (Signed)
  Radiation Oncology         [phone] ________________________________  Name: [name] MRN: [id number]  Date: 12/06/2011  DOB: 26-Mar-1976  SIMULATION AND TREATMENT PLANNING NOTE  DIAGNOSIS:  Locally advanced right breast cancer  NARRATIVE:  The patient was brought to the CT Simulation planning suite.  Identity was confirmed.  All relevant records and images related to the planned course of therapy were reviewed.  The patient freely provided informed written consent to proceed with treatment after reviewing the details related to the planned course of therapy. The consent form was witnessed and verified by the simulation staff.  Then, the patient was set-up in a stable reproducible  supine position for radiation therapy.  CT images were obtained.  Surface markings were placed.  The CT images were loaded into the planning software.  Then the target and avoidance structures were contoured.  Treatment planning then occurred.  The radiation prescription was entered and confirmed.  A total of 5 (4 MLC's, one custom neck cushion) complex treatment devices were fabricated. I have requested : Isodose Plan.   PLAN:  The patient will receive 5940 cGy in 33 fractions.  ________________________________  [name], M.D.

## 2011-12-13 ENCOUNTER — Ambulatory Visit: Payer: Medicaid Other

## 2011-12-14 ENCOUNTER — Ambulatory Visit: Payer: Medicaid Other

## 2011-12-15 ENCOUNTER — Ambulatory Visit: Admission: RE | Admit: 2011-12-15 | Payer: Medicaid Other | Source: Ambulatory Visit

## 2011-12-16 ENCOUNTER — Ambulatory Visit
Admission: RE | Admit: 2011-12-16 | Discharge: 2011-12-16 | Disposition: A | Discharge status: Home / Self Care | Payer: Medicaid Other | Source: Ambulatory Visit | Attending: Radiation Oncology | Admitting: Radiation Oncology

## 2011-12-16 NOTE — Procedures (Signed)
DIAGNOSIS:  Breast cancer.  NARRATIVE:  Earlier today, Mrs. [name] underwent film verification of her setup directed to the right chest area.  The patient's isocenter was accurately placed and the multileaf collimators contour the target volume accurately on all fields imaged.    ______________________________ [name], Ph.D., M.D. JDK/MEDQ  D:  12/15/2011  T:  12/16/2011  Job:  [id number]

## 2011-12-19 ENCOUNTER — Ambulatory Visit
Admission: RE | Admit: 2011-12-19 | Discharge: 2011-12-19 | Disposition: A | Discharge status: Home / Self Care | Payer: Medicaid Other | Source: Ambulatory Visit | Attending: Radiation Oncology | Admitting: Radiation Oncology

## 2011-12-20 ENCOUNTER — Encounter: Payer: Self-pay | Admitting: Radiation Oncology

## 2011-12-20 ENCOUNTER — Ambulatory Visit: Admission: RE | Admit: 2011-12-20 | Payer: Medicaid Other | Source: Ambulatory Visit

## 2011-12-20 ENCOUNTER — Ambulatory Visit
Admission: RE | Admit: 2011-12-20 | Discharge: 2011-12-20 | Disposition: A | Discharge status: Home / Self Care | Payer: Medicaid Other | Source: Ambulatory Visit | Attending: Radiation Oncology | Admitting: Radiation Oncology

## 2011-12-20 ENCOUNTER — Ambulatory Visit: Payer: Medicaid Other

## 2011-12-20 VITALS — BP 139/88 | HR 97 | Resp 18 | Wt 317.6 lb

## 2011-12-20 DIAGNOSIS — C50919 Malignant neoplasm of unspecified site of unspecified female breast: Secondary | ICD-10-CM

## 2011-12-20 NOTE — Progress Notes (Signed)
Patient presents to the clinic today unaccompanied for an under treat visit with Dr. Roselind Messier. Patient is alert and oriented to person, place, and time. No distress noted. Steady gait noted. Pleasant affect noted. Patient denies pain at this time. Patient reports an occasional sharp shooting pain in right chest wall that is very brief. Patient reports that she felt dizzy this morning but it has subsided. Patient has no other complaints. Reported all findings to Dr. Roselind Messier.

## 2011-12-20 NOTE — Progress Notes (Signed)
   Department of Radiation Oncology  Phone:  [phone] Fax:        [phone]   Weekly Management Note Current Dose: 3.6  Gy  Projected Dose:59.4  Gy   Narrative:  The patient presents for routine under treatment assessment.  Port film x-rays were reviewed.  The chart was checked. The patient is tolerating her treatments well at this time without any side effects. She occasionally will have some sharp shooting pains along the right chest wall area.  Physical Findings: Weight: 317 lb 9.6 oz (144.062 kg). Examination of the right chest area reveals no skin no significant radiation reaction at this point. The lungs are clear. The heart has a regular rhythm and rate. There's no palpable supraclavicular or axillary adenopathy.  Impression:  The patient is tolerating radiation.  Plan:  Continue treatment as planned.

## 2011-12-21 ENCOUNTER — Ambulatory Visit
Admission: RE | Admit: 2011-12-21 | Discharge: 2011-12-21 | Disposition: A | Discharge status: Home / Self Care | Payer: Medicaid Other | Source: Ambulatory Visit | Attending: Radiation Oncology | Admitting: Radiation Oncology

## 2011-12-21 DIAGNOSIS — C50919 Malignant neoplasm of unspecified site of unspecified female breast: Secondary | ICD-10-CM

## 2011-12-22 ENCOUNTER — Ambulatory Visit
Admission: RE | Admit: 2011-12-22 | Discharge: 2011-12-22 | Disposition: A | Discharge status: Home / Self Care | Payer: Medicaid Other | Source: Ambulatory Visit | Attending: Radiation Oncology | Admitting: Radiation Oncology

## 2011-12-22 ENCOUNTER — Ambulatory Visit
Admission: RE | Admit: 2011-12-22 | Payer: Medicaid Other | Source: Ambulatory Visit | Attending: Radiation Oncology | Admitting: Radiation Oncology

## 2011-12-23 ENCOUNTER — Ambulatory Visit
Admission: RE | Admit: 2011-12-23 | Discharge: 2011-12-23 | Disposition: A | Discharge status: Home / Self Care | Payer: Medicaid Other | Source: Ambulatory Visit | Attending: Radiation Oncology | Admitting: Radiation Oncology

## 2011-12-23 ENCOUNTER — Ambulatory Visit: Payer: Medicaid Other

## 2011-12-26 ENCOUNTER — Ambulatory Visit
Admission: RE | Admit: 2011-12-26 | Discharge: 2011-12-26 | Disposition: A | Discharge status: Home / Self Care | Payer: Medicaid Other | Source: Ambulatory Visit | Attending: Radiation Oncology | Admitting: Radiation Oncology

## 2011-12-27 ENCOUNTER — Ambulatory Visit
Admission: RE | Admit: 2011-12-27 | Discharge: 2011-12-27 | Disposition: A | Discharge status: Home / Self Care | Payer: Medicaid Other | Source: Ambulatory Visit | Attending: Radiation Oncology | Admitting: Radiation Oncology

## 2011-12-27 ENCOUNTER — Ambulatory Visit: Payer: Medicaid Other

## 2011-12-27 ENCOUNTER — Encounter: Payer: Self-pay | Admitting: Radiation Oncology

## 2011-12-27 ENCOUNTER — Encounter (INDEPENDENT_AMBULATORY_CARE_PROVIDER_SITE_OTHER): Payer: Medicaid Other | Admitting: General Surgery

## 2011-12-27 VITALS — BP 122/83 | HR 85 | Wt 317.9 lb

## 2011-12-27 DIAGNOSIS — C50919 Malignant neoplasm of unspecified site of unspecified female breast: Secondary | ICD-10-CM

## 2011-12-28 ENCOUNTER — Ambulatory Visit (INDEPENDENT_AMBULATORY_CARE_PROVIDER_SITE_OTHER): Payer: Medicaid Other | Admitting: General Surgery

## 2011-12-28 ENCOUNTER — Ambulatory Visit
Admission: RE | Admit: 2011-12-28 | Discharge: 2011-12-28 | Disposition: A | Discharge status: Home / Self Care | Payer: Medicaid Other | Source: Ambulatory Visit | Attending: Radiation Oncology | Admitting: Radiation Oncology

## 2011-12-28 ENCOUNTER — Other Ambulatory Visit: Payer: Self-pay | Admitting: *Deleted

## 2011-12-28 VITALS — BP 130/84 | HR 74 | Temp 98.7°F | Resp 16 | Ht 67.0 in | Wt 318.6 lb

## 2011-12-28 DIAGNOSIS — C50919 Malignant neoplasm of unspecified site of unspecified female breast: Secondary | ICD-10-CM

## 2011-12-28 NOTE — Progress Notes (Signed)
   Department of Radiation Oncology  Phone:  [phone] Fax:        [phone]   Weekly Management Note Current Dose:  12.6 Gy  Projected Dose: 59.4 Gy   Narrative:  The patient presents for routine under treatment assessment. Port film x-rays were reviewed.  The chart was checked. She is tolerating her treatments well. She has noticed some mild fatigue as well as some mild itching along the right chest wall area.  Physical Findings: Weight: 317 lb 14.4 oz (144.198 kg). The lungs are clear. The heart has a regular rhythm and rate. The abdomen is soft and nontender with normal bowel sounds. Examination of right chest wall area reveals hyperpigmentation changes without significant dry or moist desquamation.  Impression:  The patient is tolerating radiation well thus far.  Plan:  Continue treatment as planned.   -----------------------------------  [name], PhD, MD

## 2011-12-29 ENCOUNTER — Telehealth: Payer: Self-pay | Admitting: *Deleted

## 2011-12-29 ENCOUNTER — Encounter (INDEPENDENT_AMBULATORY_CARE_PROVIDER_SITE_OTHER): Payer: Self-pay | Admitting: General Surgery

## 2011-12-29 ENCOUNTER — Ambulatory Visit
Admission: RE | Admit: 2011-12-29 | Discharge: 2011-12-29 | Disposition: A | Discharge status: Home / Self Care | Payer: Medicaid Other | Source: Ambulatory Visit | Attending: Radiation Oncology | Admitting: Radiation Oncology

## 2011-12-29 NOTE — Telephone Encounter (Signed)
patient confirmed over the phone the new date and time on 01-11-2012 starting at 2:45pm

## 2011-12-29 NOTE — Progress Notes (Signed)
Subjective:     Patient ID: [name], female   DOB: [birth year], 36 y.o.   MRN: [id number]  HPI The patient is a 36 her old black female who is just a couple months out from a right mastectomy and sentinel node biopsy for right breast cancer. Since her last visit she has started radiation therapy. This is her second week of therapy. She is tolerating it well. She has no complaints today.  Review of Systems     Objective:   Physical Exam On exam she has no palpable mass of the right chest wall. Her skin is tolerating the radiation well so far. No palpable axillary supraclavicular or cervical lymphadenopathy.    Assessment:     2 months status post right mastectomy and sentinel node biopsy    Plan:     At this point she will continue her radiation therapy. Once this is completed she will follow with the medical oncologist. We will plan to see her back in about 3 months

## 2011-12-30 ENCOUNTER — Ambulatory Visit
Admission: RE | Admit: 2011-12-30 | Discharge: 2011-12-30 | Disposition: A | Discharge status: Home / Self Care | Payer: Medicaid Other | Source: Ambulatory Visit | Attending: Radiation Oncology | Admitting: Radiation Oncology

## 2012-01-02 ENCOUNTER — Ambulatory Visit
Admission: RE | Admit: 2012-01-02 | Discharge: 2012-01-02 | Disposition: A | Discharge status: Home / Self Care | Payer: Medicaid Other | Source: Ambulatory Visit | Attending: Radiation Oncology | Admitting: Radiation Oncology

## 2012-01-03 ENCOUNTER — Ambulatory Visit
Admission: RE | Admit: 2012-01-03 | Discharge: 2012-01-03 | Disposition: A | Discharge status: Home / Self Care | Payer: Medicaid Other | Source: Ambulatory Visit | Attending: Radiation Oncology | Admitting: Radiation Oncology

## 2012-01-03 ENCOUNTER — Encounter: Payer: Self-pay | Admitting: Radiation Oncology

## 2012-01-03 VITALS — Temp 97.6°F | Wt 315.0 lb

## 2012-01-03 DIAGNOSIS — C50919 Malignant neoplasm of unspecified site of unspecified female breast: Secondary | ICD-10-CM

## 2012-01-03 NOTE — Progress Notes (Signed)
Weekly Management Note Current Dose:  21.6 Gy  Projected Dose: 59.4 Gy   Narrative:  The patient presents for routine under treatment assessment.  CBCT/MVCT images/Port film x-rays were reviewed.  The chart was checked. Doing well. Some intermittent pain in chest wall.   Physical Findings: Weight: 315 lb (142.883 kg). Slightly dark right chest wall.   Impression:  The patient is tolerating radiation.  Plan:  Continue treatment as planned. Continue radiaplex.

## 2012-01-03 NOTE — Progress Notes (Addendum)
12 fractions to Rt Breast.  Grade pain as level 3 on scale of 0-10.  Describes pain as "achy/ throbbing" with intermittent shooting pains.  Rt chest wall skin intact, and soft to touch.  Area remains numb post surgery.  Denies any changes in her energy level.

## 2012-01-04 ENCOUNTER — Ambulatory Visit
Admission: RE | Admit: 2012-01-04 | Discharge: 2012-01-04 | Disposition: A | Discharge status: Home / Self Care | Payer: Medicaid Other | Source: Ambulatory Visit | Attending: Radiation Oncology | Admitting: Radiation Oncology

## 2012-01-05 ENCOUNTER — Ambulatory Visit
Admission: RE | Admit: 2012-01-05 | Discharge: 2012-01-05 | Disposition: A | Discharge status: Home / Self Care | Payer: Medicaid Other | Source: Ambulatory Visit | Attending: Radiation Oncology | Admitting: Radiation Oncology

## 2012-01-06 ENCOUNTER — Ambulatory Visit
Admission: RE | Admit: 2012-01-06 | Discharge: 2012-01-06 | Disposition: A | Discharge status: Home / Self Care | Payer: Medicaid Other | Source: Ambulatory Visit | Attending: Radiation Oncology | Admitting: Radiation Oncology

## 2012-01-09 ENCOUNTER — Ambulatory Visit
Admission: RE | Admit: 2012-01-09 | Discharge: 2012-01-09 | Disposition: A | Discharge status: Home / Self Care | Payer: Medicaid Other | Source: Ambulatory Visit | Attending: Radiation Oncology | Admitting: Radiation Oncology

## 2012-01-09 DIAGNOSIS — C50919 Malignant neoplasm of unspecified site of unspecified female breast: Secondary | ICD-10-CM

## 2012-01-09 DIAGNOSIS — Z86718 Personal history of other venous thrombosis and embolism: Secondary | ICD-10-CM | POA: Insufficient documentation

## 2012-01-09 NOTE — Progress Notes (Signed)
HERE TODAY FOR PUT OF RIGHT BREAST.  SKIN DARKER BUT NO DESQUAMATION NOTED.  C/O SOME TENDERNESS AT TREATMENT AREA.  USING RADIAPLEX

## 2012-01-09 NOTE — Progress Notes (Signed)
   Department of Radiation Oncology  Phone:  [phone] Fax:        [phone]   Weekly Management Note Current Dose: 28.8  Gy  Projected Dose: 59.4  Gy   Narrative:  The patient presents for routine under treatment assessment.  Port film x-rays were reviewed.  The chart was checked.  She has noticed some intermittent soreness along the right chest wall area. She denies any fatigue at this time.  Physical Findings: The lungs are clear. The heart has a regular rhythm and rate. Examination of right chest wall area reveals hyperpigmentation changes without dry desquamation.  Impression:  The patient is tolerating radiation.  Plan:  Continue treatment as planned.   -----------------------------------  [name], PhD, MD

## 2012-01-10 ENCOUNTER — Ambulatory Visit
Admission: RE | Admit: 2012-01-10 | Discharge: 2012-01-10 | Disposition: A | Discharge status: Home / Self Care | Payer: Medicaid Other | Source: Ambulatory Visit | Attending: Radiation Oncology | Admitting: Radiation Oncology

## 2012-01-11 ENCOUNTER — Other Ambulatory Visit: Payer: Medicaid Other | Admitting: Lab

## 2012-01-11 ENCOUNTER — Ambulatory Visit
Admission: RE | Admit: 2012-01-11 | Discharge: 2012-01-11 | Disposition: A | Discharge status: Home / Self Care | Payer: Medicaid Other | Source: Ambulatory Visit | Attending: Radiation Oncology | Admitting: Radiation Oncology

## 2012-01-11 ENCOUNTER — Ambulatory Visit: Payer: Medicaid Other | Admitting: Physician Assistant

## 2012-01-12 ENCOUNTER — Ambulatory Visit
Admission: RE | Admit: 2012-01-12 | Discharge: 2012-01-12 | Disposition: A | Discharge status: Home / Self Care | Payer: Medicaid Other | Source: Ambulatory Visit | Attending: Radiation Oncology | Admitting: Radiation Oncology

## 2012-01-13 ENCOUNTER — Ambulatory Visit: Payer: Medicaid Other

## 2012-01-17 ENCOUNTER — Ambulatory Visit
Admission: RE | Admit: 2012-01-17 | Discharge: 2012-01-17 | Disposition: A | Discharge status: Home / Self Care | Payer: Medicaid Other | Source: Ambulatory Visit | Attending: Radiation Oncology | Admitting: Radiation Oncology

## 2012-01-17 ENCOUNTER — Ambulatory Visit: Payer: Medicaid Other | Admitting: Obstetrics and Gynecology

## 2012-01-18 ENCOUNTER — Encounter: Payer: Self-pay | Admitting: Physician Assistant

## 2012-01-18 ENCOUNTER — Ambulatory Visit
Admission: RE | Admit: 2012-01-18 | Discharge: 2012-01-18 | Disposition: A | Discharge status: Home / Self Care | Payer: Medicaid Other | Source: Ambulatory Visit | Attending: Radiation Oncology | Admitting: Radiation Oncology

## 2012-01-18 ENCOUNTER — Other Ambulatory Visit: Payer: Medicaid Other | Admitting: Lab

## 2012-01-18 ENCOUNTER — Encounter: Payer: Self-pay | Admitting: Radiation Oncology

## 2012-01-18 ENCOUNTER — Ambulatory Visit (HOSPITAL_BASED_OUTPATIENT_CLINIC_OR_DEPARTMENT_OTHER): Payer: Medicaid Other | Admitting: Physician Assistant

## 2012-01-18 VITALS — Wt 311.7 lb

## 2012-01-18 VITALS — BP 155/82 | HR 96 | Temp 99.0°F | Ht 67.0 in | Wt 311.9 lb

## 2012-01-18 DIAGNOSIS — C50919 Malignant neoplasm of unspecified site of unspecified female breast: Secondary | ICD-10-CM

## 2012-01-18 DIAGNOSIS — D509 Iron deficiency anemia, unspecified: Secondary | ICD-10-CM

## 2012-01-18 DIAGNOSIS — Z5111 Encounter for antineoplastic chemotherapy: Secondary | ICD-10-CM

## 2012-01-18 LAB — CBC WITH DIFFERENTIAL/PLATELET
Basophils Absolute: 0 10*3/uL (ref 0.0–0.1)
Eosinophils Absolute: 0.1 10*3/uL (ref 0.0–0.5)
HCT: 34.9 % (ref 34.8–46.6)
HGB: 11.2 g/dL — ABNORMAL LOW (ref 11.6–15.9)
MCV: 80 fL (ref 79.5–101.0)
MONO%: 9.8 % (ref 0.0–14.0)
NEUT#: 4.3 10*3/uL (ref 1.5–6.5)
NEUT%: 78.6 % — ABNORMAL HIGH (ref 38.4–76.8)
RDW: 14.3 % (ref 11.2–14.5)
lymph#: 0.5 10*3/uL — ABNORMAL LOW (ref 0.9–3.3)

## 2012-01-18 LAB — COMPREHENSIVE METABOLIC PANEL
Albumin: 3.6 g/dL (ref 3.5–5.2)
BUN: 13 mg/dL (ref 6–23)
CO2: 26 mEq/L (ref 19–32)
Glucose, Bld: 131 mg/dL — ABNORMAL HIGH (ref 70–99)
Potassium: 3.5 mEq/L (ref 3.5–5.3)
Sodium: 138 mEq/L (ref 135–145)
Total Protein: 7.4 g/dL (ref 6.0–8.3)

## 2012-01-18 MED ORDER — ANASTROZOLE 1 MG PO TABS: 1.0000 mg | ORAL_TABLET | Freq: Every day | ORAL | Status: DC

## 2012-01-18 MED ORDER — GOSERELIN ACETATE 10.8 MG ~~LOC~~ IMPL
10.8000 mg | DRUG_IMPLANT | SUBCUTANEOUS | Status: DC
  Administered 2012-01-18: 10.8 mg via SUBCUTANEOUS
  Filled 2012-01-18: qty 10.8

## 2012-01-18 NOTE — Progress Notes (Signed)
Weekly Management Note Current Dose: 37.8  Gy  Projected Dose:59.4  Gy   Narrative:  The patient presents for routine under treatment assessment.  Port film x-rays were reviewed.  The chart was checked.  She is having some fatigue at this time. She also has noticed some itching along the right chest wall area.  Physical Findings: Weight: 311 lb 11.2 oz (141.386 kg). The lungs are clear. The heart has regular rhythm and rate. Examination of right chest wall area reveals hyperpigmentation changes and dry desquamation. There is no moist desquamation. The patient also does have some hyperpigmentation changes in the right supraclavicular region.  Impression:  The patient is tolerating radiation except for issues as above.  Plan:  Continue treatment as planned.   -----------------------------------  [name], PhD, MD

## 2012-01-18 NOTE — Progress Notes (Signed)
Pt denies pain, does have some fatigue. Applying Radiaplex to tx area.

## 2012-01-18 NOTE — Progress Notes (Signed)
ID: [name]   DOB: 1975-11-08  MR#: [id number]  WUJ#:[id number]  HISTORY OF PRESENT ILLNESS: The patient developed pain in her right breast and brought it to her primary care physician's attention. Mammography at [facility] 03/10/2011 found the breasts to be heterogeneously dense. There was a mildly irregular mass posteriorly in the upper outer quadrant of the right breast. On ultrasound this was hypoechoic and measured approximately 2.4 cm, with irregular margins. The left breast was unremarkable.  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 Bilateral breast MRIs were obtained 03/18/2011, showing in the upper outer quadrant of the right breast an irregular mass measuring 5.5 cm. There was a suggestion of cortical thickening in the level I right axillary lymph node, which measured 1.2 cm. The patient was staged with chest CT scan and PET scan, which showed no distant disease. These studies did confirm the abnormality in the right breast as well as a hypermetabolic right axillary lymph node, making this a clinical T3 N1 or stage III invasive ductal carcinoma.  [name] completed neoadjuvant chemotherapy and proceeded to surgery and subsequent treatment as detailed below..  INTERVAL HISTORY: [name] returns today for followup of her breast cancer. [name]'s currently receiving radiation therapy which [name] tells me [name] is tolerating well. [name] is back to work full-time. [name] is hoping to start classes again, perhaps in the fall, for social work. [name] is trying to lose a little weight, and this trying to be more active physically. Overall, [name] is feeling well.   REVIEW OF SYSTEMS: [name] has had no recent illnesses and denies fevers, chills, or night sweats. [name] currently denies hot flashes. Her last menstrual cycle was 11/27/2011 and was "normal".  No skin changes or abnormal bleeding. [name]'s had no nausea or change in bowel habits. No chest pain, palpitations, or shortness of breath. No abnormal headaches. [name] occasionally feels a little dizzy, especially if [name] stands up too fast. [name] is followed by her PCP for hypertension. [name] denies any pain although [name] occasionally has cramps in her feet. Fortunately, however, the peripheral neuropathy in her feet has completely resolved.  A detailed review of systems is otherwise noncontributory.  PAST MEDICAL HISTORY: Past Medical History  Diagnosis Date  . Hypertension   . Breast pain   . Abdominal pain   . Back pain   . Heart palpitations   . Shortness of breath   . Ulcer of abdomen wall   . Headache     hx migraine in childhood  . Depression   . Anemia   . Cancer     rt breast  . Blood transfusion 08/2011  . GERD (gastroesophageal reflux disease)   . Status post chemotherapy     doxorubicin/cyclophosphamide  . Breast cancer 03/14/11    dx right breast=invasive ductal ca,dcis, ER/pr=positive  . Peripheral neuropathy     s/p chemotherapy    PAST SURGICAL HISTORY: Past Surgical History  Procedure Date  . Hip pins 36 y/o    pins placed in both hips   . Portacath placement   . Complete mastectomy w/ sentinel node biopsy 10/14/11    Dr. Carolynne Edouard  Right breast   . Breast surgery   . Mastectomy w/ sentinel node biopsy 10/14/2011    Procedure: MASTECTOMY WITH SENTINEL LYMPH NODE BIOPSY;  Surgeon: Robyne Askew, MD;  Location: [facility] OR;  Service: General;  Laterality: Right;  . Wound exploration -22-13    s/p R maste; burke thompson    FAMILY HISTORY Family History  Problem Relation Age of Onset  . Heart disease Father     heart attack  . Heart attack Father     died age 13  . Hypertension Father   . Hypertension Mother   . Anemia Mother   . Diabetes Mother   . Anemia Sister   . Hypertension Brother   . Diabetes Brother   . Heart disease Maternal Grandmother     MI  .  Hypertension Maternal Grandmother   . Diabetes Maternal Grandmother   . Hypertension Paternal Grandmother   [name] is alive at age 73. The patient's father died from a myocardial infarction at the age of 35. [name] has one sister and one brother. There is no history of breast or ovarian cancer in the family.   GYNECOLOGIC HISTORY: Menarche age 70. [name] was premenopausal at the time of diagnosis. [name] has never used birth control pills. [name] is GX P2. First pregnancy to term age 78. [name] had her last period at the time of her surgery.  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 Her husband of 3 years, Oviya Ammar, works in Holiday representative. Daughter, [name], 16, and [name], 12, both live at home with the patient and her husband as does the patient's mother.    ADVANCED DIRECTIVES:  HEALTH MAINTENANCE: History  Substance Use Topics  . Smoking status: Never Smoker   . Smokeless tobacco: Never Used   Comment: quit 1998  . Alcohol Use: No     Colonoscopy:  PAP:  Bone density:  Lipid panel:  Allergies  Allergen Reactions  . Oxycodone Other (See Comments)    headache  . Tape     "Tears my skin up"    Current Outpatient Prescriptions  Medication Sig Dispense Refill  . B Complex-C (SUPER B COMPLEX PO) Take 1 tablet by mouth daily.      . benazepril-hydrochlorthiazide (LOTENSIN HCT) 20-12.5 MG per tablet Take 1 tablet by mouth 2 (two) times daily.      . ferrous sulfate (FERRO-BOB) 325 (65 FE) MG tablet Take 325 mg by mouth daily with breakfast.      . goserelin (ZOLADEX) 10.8 MG injection Inject 10.8 mg into the skin every 3 (three) months.      . pantoprazole (PROTONIX) 40 MG tablet TAKE ONE TABLET BY MOUTH EVERY DAY  30 tablet  0  . anastrozole (ARIMIDEX) 1 MG tablet Take 1 tablet (1 mg total) by mouth daily.  30 tablet  5  . ranitidine (ZANTAC) 75 MG tablet Take 75 mg by mouth 2 (two) times daily.      Marland Kitchen DISCONTD: potassium chloride SA  (K-DUR,KLOR-CON) 20 MEQ tablet Take 1 tablet (20 mEq total) by mouth 2 (two) times daily.  60 tablet  1   Current Facility-Administered Medications  Medication Dose Route Frequency Provider Last Rate Last Dose  . goserelin (ZOLADEX) injection 10.8 mg  10.8 mg Subcutaneous Q90 days Lowella Dell, MD   10.8 mg at 01/18/12 1043    OBJECTIVE: Young African American woman who appears fatigued but in no acute distress. Filed Vitals:   01/18/12 0927  BP: 155/82  Pulse: 96  Temp: 99 F (37.2 C)     Body mass index is 48.85 kg/(m^2).    ECOG FS: 1  Filed Weights   01/18/12 [phone]  Weight: 311 lb 14.4 oz (141.477 kg)   Physical Exam: HEENT:  Sclerae anicteric, conjunctivae pink.  Oropharynx clear.  Buccal mucosa is pink and moist. Nodes:  No cervical, supraclavicular, or axillary lymphadenopathy palpated.  Breast Exam:  Patient is status post right mastectomy. Well-healed incision. There is some hyperpigmentation secondary to radiation therapy, but no suspicious skin changes, no evidence of local recurrence. Left breast is large and pendulous. Breast appears benign, with no masses, skin changes, or nipple inversion. Lungs:  Clear to auscultation bilaterally.  No crackles, rhonchi, or wheezes.   Heart:  Regular rate and rhythm.   Abdomen:  Soft, obese, nontender.  Positive bowel sounds.  No organomegaly or masses palpated.   Musculoskeletal:  No focal spinal tenderness to palpation.  Extremities:  Benign.  No peripheral edema or cyanosis.   Skin:  Benign.   Neuro:  Nonfocal. Alert and oriented x3.    LAB RESULTS: Lab Results  Component Value Date   WBC 5.5 01/18/2012   NEUTROABS 4.3 01/18/2012   HGB 11.2* 01/18/2012   HCT 34.9 01/18/2012   MCV 80.0 01/18/2012   PLT 271 01/18/2012      Chemistry      Component Value Date/Time   NA 138 01/18/2012 0918   K 3.5 01/18/2012 0918   CL 101 01/18/2012 0918   CO2 26 01/18/2012 0918   BUN 13 01/18/2012 0918   CREATININE 0.77 01/18/2012 0918       Component Value Date/Time   CALCIUM 9.4 01/18/2012 0918   ALKPHOS 87 01/18/2012 0918   AST 17 01/18/2012 0918   ALT 11 01/18/2012 0918   BILITOT 0.5 01/18/2012 0918       Lab Results  Component Value Date   LABCA2 19 11/17/2011    STUDIES: No new results found. Left mammogram will be due late July of this year  ASSESSMENT: 36 year old BRCA 1-2 negative [place] woman   (1) status post right breast biopsy July of 2012 for a clinical T3 N1 (stage III) invasive ductal carcinoma, grade 3, 99% estrogen and  100% progesterone receptor positive, HER-2 negative, with an MIB-1 of 98%.   (2) Status post 4 cycles of dose dense doxorubicin and cyclophosphamide, followed by weekly paclitaxel x 8, completed 08/25/2011  (3) s/p right mastectomy and sentinel lymph node sampling 10/14/2011 for a residual ypT2 ypN1(mic) invasive ductal carcinoma, grade 3, with ample margins  (4) currently receiving active radiation therapy under the care of Dr. Roselind Messier, scheduled to complete in mid June.  PLAN:  This case was reviewed with Dr. Darnelle Catalan. Again, due to the risk of clotting with tamoxifen, and the patient's history of DVT, he would like to proceed with an aromatase inhibitor. Specifically, we will begin the patient on anastrozole when [name] completes radiation therapy in approximately 2-3 weeks. I will plan on seeing her for brief followup in June, just before [name] begins on the anastrozole. I did, however, give her the written prescription today.    [name] is perimenopausal, so [name] will also receive Zoladex every 3 months. [name] received her first injection of Zoladex here in the office today. Over half of our 45 minute appointment today was spent reviewing the benefits and possible side effects associated with the Zoladex and the aromatase inhibitor.  The goal will be to continue antiestrogen therapy for 5-10 years.   Patient voices understanding and agreement with our plan. [name] knows to call with any changes or  problems.   Kaede Clendenen    01/18/2012

## 2012-01-19 ENCOUNTER — Ambulatory Visit
Admission: RE | Admit: 2012-01-19 | Discharge: 2012-01-19 | Disposition: A | Discharge status: Home / Self Care | Payer: Medicaid Other | Source: Ambulatory Visit | Attending: Radiation Oncology | Admitting: Radiation Oncology

## 2012-01-20 ENCOUNTER — Ambulatory Visit
Admission: RE | Admit: 2012-01-20 | Discharge: 2012-01-20 | Disposition: A | Discharge status: Home / Self Care | Payer: Medicaid Other | Source: Ambulatory Visit | Attending: Radiation Oncology | Admitting: Radiation Oncology

## 2012-01-23 ENCOUNTER — Ambulatory Visit
Admission: RE | Admit: 2012-01-23 | Discharge: 2012-01-23 | Disposition: A | Discharge status: Home / Self Care | Payer: Medicaid Other | Source: Ambulatory Visit | Attending: Radiation Oncology | Admitting: Radiation Oncology

## 2012-01-24 ENCOUNTER — Ambulatory Visit
Admission: RE | Admit: 2012-01-24 | Discharge: 2012-01-24 | Disposition: A | Discharge status: Home / Self Care | Payer: Medicaid Other | Source: Ambulatory Visit | Attending: Radiation Oncology | Admitting: Radiation Oncology

## 2012-01-24 ENCOUNTER — Encounter: Payer: Self-pay | Admitting: Radiation Oncology

## 2012-01-24 VITALS — Temp 97.3°F | Wt 311.1 lb

## 2012-01-24 DIAGNOSIS — C50919 Malignant neoplasm of unspecified site of unspecified female breast: Secondary | ICD-10-CM

## 2012-01-24 NOTE — Progress Notes (Signed)
Intermittent shooting pains in the lateral side of her right neck and in the  Fold in right chest wall area.  Skin intact and soft.  No skin of desquamation.

## 2012-01-24 NOTE — Progress Notes (Signed)
   Department of Radiation Oncology  Phone:  [phone] Fax:        [phone]   Weekly Management Note  Current Dose:  45.0 Gy  Projected Dose: 59.4  Gy   Narrative:  The patient presents for routine under treatment assessment.  Port film x-rays were reviewed.  The chart was checked. The patient's electron setup was checked on the linear accelerator table today.  She is having discomfort along the right chest wall area but overall seems to be tolerating her treatments well.  Physical Findings: Weight: 311 lb 1.6 oz (141.114 kg). The lungs are clear. The heart has regular rhythm and rate. Examination right chest wall area reveals  hyperpigmentation changes but no dry or moist desquamation.  Impression:  The patient is tolerating radiation reasonably well.  Plan:  Continue treatment as planned.   -----------------------------------  [name], PhD, MD

## 2012-01-25 ENCOUNTER — Ambulatory Visit
Admission: RE | Admit: 2012-01-25 | Discharge: 2012-01-25 | Disposition: A | Discharge status: Home / Self Care | Payer: Medicaid Other | Source: Ambulatory Visit | Attending: Radiation Oncology | Admitting: Radiation Oncology

## 2012-01-26 ENCOUNTER — Ambulatory Visit
Admission: RE | Admit: 2012-01-26 | Discharge: 2012-01-26 | Disposition: A | Discharge status: Home / Self Care | Payer: Medicaid Other | Source: Ambulatory Visit | Attending: Radiation Oncology | Admitting: Radiation Oncology

## 2012-01-26 NOTE — Progress Notes (Signed)
Encounter addended by: Agnes Lawrence, RN on: 01/26/2012  4:44 PM<BR>     Documentation filed: Charges VN

## 2012-01-27 ENCOUNTER — Ambulatory Visit
Admission: RE | Admit: 2012-01-27 | Discharge: 2012-01-27 | Disposition: A | Discharge status: Home / Self Care | Payer: Medicaid Other | Source: Ambulatory Visit | Attending: Radiation Oncology | Admitting: Radiation Oncology

## 2012-01-27 ENCOUNTER — Encounter: Payer: Self-pay | Admitting: Radiation Oncology

## 2012-01-27 NOTE — Progress Notes (Signed)
Patient presented to the clinic today unaccompanied requesting skin check. Patient reported burning discomfort right chest wall and right side of neck. Hyperpigmented right chest wall noted without desquamation. 3" inch area of hyperpigmentation without desquamation noted right side of neck. Encouraged patient to apply Radiaplex frequently and take OTC Motrin as directed on bottle for pain relief. Encouraged patient to contact staff with needs. Patient verbalized understanding of all things reviewed.

## 2012-01-30 ENCOUNTER — Ambulatory Visit
Admission: RE | Admit: 2012-01-30 | Discharge: 2012-01-30 | Disposition: A | Discharge status: Home / Self Care | Payer: Medicaid Other | Source: Ambulatory Visit | Attending: Radiation Oncology | Admitting: Radiation Oncology

## 2012-01-31 ENCOUNTER — Ambulatory Visit
Admission: RE | Admit: 2012-01-31 | Discharge: 2012-01-31 | Disposition: A | Discharge status: Home / Self Care | Payer: Medicaid Other | Source: Ambulatory Visit | Attending: Radiation Oncology | Admitting: Radiation Oncology

## 2012-01-31 ENCOUNTER — Ambulatory Visit: Payer: Self-pay | Admitting: Obstetrics and Gynecology

## 2012-01-31 DIAGNOSIS — C50919 Malignant neoplasm of unspecified site of unspecified female breast: Secondary | ICD-10-CM

## 2012-01-31 NOTE — Progress Notes (Signed)
   Department of Radiation Oncology  Phone:  [phone] Fax:        [phone]   Weekly Management Note Current Dose: 54.0  Gy  Projected Dose: 59.4  Gy   Narrative:  The patient presents for routine under treatment assessment.  Port film x-rays were reviewed.  The chart was checked. He is having some pain along the right neck as well as right low axillary area. She did require Vicodin for sleeping last night. She continues to use radiaplex  for her skin care  Physical Findings: Weight: 313.4 pounds.  The lungs are clear. The heart has a regular rhythm and rate. Examination of right chest wall area reveals hyperpigmentation and dry desquamation. There is  impending moist desquamation along the chest wall as well as low axillary area.  Impression:  The patient is tolerating radiation reasonably well except for issues as above. Patient's skin is holding up better than expected. I do however expect moist desquamation develop in the near future and advised patient to use a triple anabolic ointment if this happens.  Plan:  Continue treatment as planned.   -----------------------------------  [name], PhD, MD

## 2012-01-31 NOTE — Progress Notes (Signed)
HERE TODAY FOR PUT OF RIGHT CHEST WALL.  SKIN VERY DARK WITH SOME WET DESQUAMATION UNDER ARM.  HAS PAIN RATED AT 5/10 NOW.  TAKES VICODIN FOR THIS AT NIGHT TO HELP HER SLEEP.

## 2012-02-01 ENCOUNTER — Ambulatory Visit
Admission: RE | Admit: 2012-02-01 | Discharge: 2012-02-01 | Disposition: A | Discharge status: Home / Self Care | Payer: Medicaid Other | Source: Ambulatory Visit | Attending: Radiation Oncology | Admitting: Radiation Oncology

## 2012-02-02 ENCOUNTER — Ambulatory Visit
Admission: RE | Admit: 2012-02-02 | Discharge: 2012-02-02 | Disposition: A | Discharge status: Home / Self Care | Payer: Medicaid Other | Source: Ambulatory Visit | Attending: Radiation Oncology | Admitting: Radiation Oncology

## 2012-02-03 ENCOUNTER — Inpatient Hospital Stay: Admission: RE | Admit: 2012-02-03 | Payer: Medicaid Other | Source: Ambulatory Visit | Admitting: Radiation Oncology

## 2012-02-03 ENCOUNTER — Ambulatory Visit
Admission: RE | Admit: 2012-02-03 | Discharge: 2012-02-03 | Payer: Medicaid Other | Source: Ambulatory Visit | Attending: Radiation Oncology | Admitting: Radiation Oncology

## 2012-02-03 ENCOUNTER — Encounter: Payer: Self-pay | Admitting: Radiation Oncology

## 2012-02-03 ENCOUNTER — Ambulatory Visit
Admission: RE | Admit: 2012-02-03 | Discharge: 2012-02-03 | Disposition: A | Discharge status: Home / Self Care | Payer: Medicaid Other | Source: Ambulatory Visit | Attending: Radiation Oncology | Admitting: Radiation Oncology

## 2012-02-03 VITALS — Wt 312.0 lb

## 2012-02-03 DIAGNOSIS — C50919 Malignant neoplasm of unspecified site of unspecified female breast: Secondary | ICD-10-CM

## 2012-02-03 NOTE — Progress Notes (Signed)
HERE FOR PUT OF RIGHT BREAST/CHEST WALL.  SKIN VERY DARK WITH MODERATE AREA OF WET DESQUAMATION NEAR AXILLA AREA.  SAYS SHE HAS BURNING WITH THIS.  USING RADIAPLEX.....FINAL TX

## 2012-02-03 NOTE — Progress Notes (Signed)
  Radiation Oncology         [phone] ________________________________  Name: [name] MRN: [id number]  Date: 02/03/2012  DOB: [birth year]  Weekly Radiation Therapy Management  Current Dose = 59.4  Narrative . . . . . . . . The patient presents for the final under treatment assessment.                           The patient has had some continuation of previously noted symptoms.                                 Set-up films were reviewed.                                 The chart was checked. Physical Findings. . . Weight essentially stable.  Her skin shows diffuse dark hyperpigmentation and region of moist desquamation in the right axilla. Impression . . . . . . . The patient tolerated radiation relatively well. Plan . . . . . . . . . . . . Complete radiation today as scheduled, and follow-up in one month. The patient was encouraged to call or return to the clinic in the interim for any worsening symptoms.  I recommended Neosporin to the moist desquamation twice daily.  ________________________________  Artist Pais Kathrynn Running, M.D.

## 2012-02-07 ENCOUNTER — Telehealth: Payer: Self-pay | Admitting: *Deleted

## 2012-02-07 NOTE — Progress Notes (Signed)
  Radiation Oncology         [phone] ________________________________  Name: KENNEDEY DIGILIO MRN: [id number]  Date: 02/03/2012  DOB: 07/19/1976  End of Treatment Note  Diagnosis:   Locally advanced right breast cancer     Indication for treatment:  Postmastectomy with high risk for local regional recurrence       Radiation treatment dates:   12/19/2011 through 02/03/2012  Site/dose:   Right chest wall high axilla and supraclavicular region, 4500 cGy in 25 fractions. The right chest wall area was boosted further to a cumulative dose of 5940 cGy.  Beams/energy:   Tangential beams directed the right chest wall area. Forward planning was used to improve the dose homogeneity. Combination of 6 and 18 MV photons were used to deliver the patient's treatment. The mastectomy scar area was boosted further with 9 MeV electrons using a 0.8 cm bolus.  Narrative: The patient tolerated radiation treatment relatively well.   Towards the end of her therapy she did develop moist desquamation which was treated with the triple antibiotic ointment. She did have some associated discomfort and itching along the chest wall area as well as fatigue towards into for therapy.  Plan: The patient has completed radiation treatment. The patient will return to radiation oncology clinic for routine followup in one month. I advised them to call or return sooner if they have any questions or concerns related to their recovery or treatment.  -----------------------------------  [name], PhD, MD

## 2012-02-07 NOTE — Telephone Encounter (Signed)
Called patients home,spoke with daughter who stated patient wasn't home, asked her to have patient call back fo [place] to complete the disabilty forms per MD, daughter repeated our phone number back [phone] and will give message to her mother when she gets back home,thanked daughter 3:53 PM

## 2012-02-14 ENCOUNTER — Ambulatory Visit (HOSPITAL_BASED_OUTPATIENT_CLINIC_OR_DEPARTMENT_OTHER): Payer: Medicaid Other | Admitting: Physician Assistant

## 2012-02-14 ENCOUNTER — Encounter: Payer: Self-pay | Admitting: Physician Assistant

## 2012-02-14 VITALS — BP 113/81 | HR 98 | Temp 98.9°F | Ht 67.0 in | Wt 312.9 lb

## 2012-02-14 DIAGNOSIS — Z1231 Encounter for screening mammogram for malignant neoplasm of breast: Secondary | ICD-10-CM

## 2012-02-14 DIAGNOSIS — Z86718 Personal history of other venous thrombosis and embolism: Secondary | ICD-10-CM

## 2012-02-14 DIAGNOSIS — R5381 Other malaise: Secondary | ICD-10-CM

## 2012-02-14 DIAGNOSIS — Z170 Estrogen receptor positive status [ER+]: Secondary | ICD-10-CM

## 2012-02-14 DIAGNOSIS — C50419 Malignant neoplasm of upper-outer quadrant of unspecified female breast: Secondary | ICD-10-CM

## 2012-02-14 DIAGNOSIS — C50919 Malignant neoplasm of unspecified site of unspecified female breast: Secondary | ICD-10-CM

## 2012-02-14 DIAGNOSIS — Z853 Personal history of malignant neoplasm of breast: Secondary | ICD-10-CM

## 2012-02-14 NOTE — Progress Notes (Signed)
ID: [name]   DOB: 1976-05-22  MR#: [id number]  CSN#:[id number]  HISTORY OF PRESENT ILLNESS: The patient developed pain in her right breast and brought it to her primary care physician's attention. Mammography at [facility] 03/10/2011 found the breasts to be heterogeneously dense. There was a mildly irregular mass posteriorly in the upper outer quadrant of the right breast. On ultrasound this was hypoechoic and measured approximately 2.4 cm, with irregular margins. The left breast was unremarkable.  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 Bilateral breast MRIs were obtained 03/18/2011, showing in the upper outer quadrant of the right breast an irregular mass measuring 5.5 cm. There was a suggestion of cortical thickening in the level I right axillary lymph node, which measured 1.2 cm. The patient was staged with chest CT scan and PET scan, which showed no distant disease. These studies did confirm the abnormality in the right breast as well as a hypermetabolic right axillary lymph node, making this a clinical T3 N1 or stage III invasive ductal carcinoma.  She completed neoadjuvant chemotherapy and proceeded to surgery and subsequent treatment as detailed below..  INTERVAL HISTORY: [name] returns today for followup of her breast cancer. She has completed radiation therapy which she tolerated well. 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 Recall that [name] received her first every 3 month dose of Zoladex one month ago on 01/18/2012. She had one very brief menstrual cycle that lasted for 3 days and began on a few days after the injection on June 3. She has not yet started the anastrozole.   REVIEW OF SYSTEMS: [name] continues to complain of fatigue. She continues to  be followed by her primary care physician for hypertension which at this time seems to be under control. She's had no recent illnesses and denies fevers, chills, or night sweats. She currently denies hot flashes. No skin changes or abnormal bleeding. She's had no nausea or change in bowel habits. No chest pain, palpitations, or shortness of breath. No abnormal headaches.   A detailed review of systems is otherwise noncontributory.  PAST MEDICAL HISTORY: Past Medical History  Diagnosis Date  . Hypertension   . Breast pain   . Abdominal pain   . Back pain   . Heart palpitations   . Shortness of breath   . Ulcer of abdomen wall   . Headache     hx migraine in childhood  . Depression   . Anemia   . Cancer     rt breast  . Blood transfusion 08/2011  . GERD (gastroesophageal reflux disease)   . Status post chemotherapy     doxorubicin/cyclophosphamide  . Breast cancer 03/14/11    dx right breast=invasive ductal ca,dcis, ER/pr=positive  . Peripheral neuropathy     s/p chemotherapy    PAST SURGICAL HISTORY: Past Surgical History  Procedure Date  . Hip pins 36 y/o    pins placed in both hips   . Portacath placement   . Complete mastectomy w/ sentinel node biopsy 10/14/11    Dr. Carolynne Edouard  Right breast   . Breast surgery   . Mastectomy w/ sentinel node biopsy 10/14/2011    Procedure: MASTECTOMY WITH SENTINEL LYMPH NODE BIOPSY;  Surgeon: Robyne Askew, MD;  Location: [facility] OR;  Service: General;  Laterality: Right;  . Wound exploration -22-13    s/p R maste; burke thompson    FAMILY HISTORY Family History  Problem Relation Age of Onset  . Heart disease Father     heart attack  . Heart attack Father     died age 40  . Hypertension Father   . Hypertension Mother   . Anemia Mother   . Diabetes Mother   . Anemia Sister   . Hypertension Brother   . Diabetes Brother   . Heart disease Maternal Grandmother     MI  . Hypertension Maternal Grandmother   . Diabetes Maternal Grandmother    . Hypertension Paternal Grandmother   [name] is alive at age 20. The patient's father died from a myocardial infarction at the age of 86. She has one sister and one brother. There is no history of breast or ovarian cancer in the family.   GYNECOLOGIC HISTORY: Menarche age 58. She was premenopausal at the time of diagnosis. She has never used birth control pills. She is GX P2. First pregnancy to term age 33. She had her last period at the time of her surgery.  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 Her husband of 3 years, [name], works in Holiday representative. Daughter, [name], 16, and [name], 12, both live at home with the patient and her husband as does the patient's mother.    ADVANCED DIRECTIVES:  HEALTH MAINTENANCE: History  Substance Use Topics  . Smoking status: Never Smoker   . Smokeless tobacco: Never Used   Comment: quit 1998  . Alcohol Use: No     Colonoscopy:  PAP:  Bone density:  Lipid panel:  Allergies  Allergen Reactions  . Oxycodone Other (See Comments)    headache  . Tape     "Tears my skin up"    Current Outpatient Prescriptions  Medication Sig Dispense Refill  . anastrozole (ARIMIDEX) 1 MG tablet Take 1 tablet (1 mg total) by mouth daily.  30 tablet  5  . B Complex-C (SUPER B COMPLEX PO) Take 1 tablet by mouth daily.      . benazepril-hydrochlorthiazide (LOTENSIN HCT) 20-12.5 MG per tablet Take 1 tablet by mouth 2 (two) times daily.      . ferrous sulfate (FERRO-BOB) 325 (65 FE) MG tablet Take 325 mg by mouth daily with breakfast.      . goserelin (ZOLADEX) 10.8 MG injection Inject 10.8 mg into the skin every 3 (three) months.      . pantoprazole (PROTONIX) 40 MG tablet TAKE ONE TABLET BY MOUTH EVERY DAY  30 tablet  0  . ranitidine (ZANTAC) 75 MG tablet Take 75 mg by mouth 2 (two) times daily.      Marland Kitchen DISCONTD: potassium chloride SA (K-DUR,KLOR-CON) 20 MEQ tablet Take 1 tablet (20 mEq total) by mouth 2  (two) times daily.  60 tablet  1    OBJECTIVE: Young [place] American woman who appears fatigued but in no acute distress. Filed Vitals:   02/14/12 1320  BP: 113/81  Pulse: 98  Temp: 98.9 F (37.2 C)     Body mass index is 49.01 kg/(m^2).    ECOG FS: 1  Filed Weights   02/14/12 1320  Weight: 312 lb 14.4 oz (141.931 kg)   Physical Exam: HEENT:  Sclerae anicteric, conjunctivae pink.  Oropharynx clear.   Nodes:  No cervical, supraclavicular, or axillary lymphadenopathy palpated.  Breast Exam:  Deferred Lungs:  Clear to auscultation bilaterally.  No crackles, rhonchi, or wheezes.   Heart:  Regular rate and rhythm.   Abdomen:  Soft, obese, nontender.  Positive bowel sounds.  No organomegaly or masses palpated.   Musculoskeletal:  No focal spinal tenderness to palpation.  Extremities:  Benign.  No peripheral edema or cyanosis.   Skin:  Benign.   Neuro:  Nonfocal. Alert and oriented x3.    LAB RESULTS: Lab Results  Component Value Date   WBC 5.5 01/18/2012   NEUTROABS 4.3 01/18/2012   HGB 11.2* 01/18/2012   HCT 34.9 01/18/2012   MCV 80.0 01/18/2012   PLT 271 01/18/2012      Chemistry      Component Value Date/Time   NA 138 01/18/2012 0918   K 3.5 01/18/2012 0918   CL 101 01/18/2012 0918   CO2 26 01/18/2012 0918   BUN 13 01/18/2012 0918   CREATININE 0.77 01/18/2012 0918      Component Value Date/Time   CALCIUM 9.4 01/18/2012 0918   ALKPHOS 87 01/18/2012 0918   AST 17 01/18/2012 0918   ALT 11 01/18/2012 0918   BILITOT 0.5 01/18/2012 0918       Lab Results  Component Value Date   LABCA2 19 11/17/2011    STUDIES: No new results found. Left mammogram will be due late July of this year  ASSESSMENT: 36 year old BRCA 1-2 negative [place] woman   (1) status post right breast biopsy July of 2012 for a clinical T3 N1 (stage III) invasive ductal carcinoma, grade 3, 99% estrogen and  100% progesterone receptor positive, HER-2 negative, with an MIB-1 of 98%.   (2) Status post 4  cycles of dose dense doxorubicin and cyclophosphamide, followed by weekly paclitaxel x 8, completed 08/25/2011  (3) s/p right mastectomy and sentinel lymph node sampling 10/14/2011 for a residual ypT2 ypN1(mic) invasive ductal carcinoma, grade 3, with ample margins  (4) status post radiation therapy under the care of Dr. Roselind Messier,  completed in mid June.  (5)  Anti-estrogen therapy following radiation, with first Zoladex injection given on 01/18/2012, and to be continued every 3 months. Patient has a history of DVT, and was started on anastrozole rather than tamoxifen. Will begin the anastrozole the week of June 25.  PLAN:  This case was reviewed with Dr. Darnelle Catalan. Ayeisha appears to have tolerated her first injection of Zoladex well. She will go ahead and start her anastrozole this week as directed. She will be due for her next Zoladex injection in 8 weeks, and I will see her at that time to assess tolerance as well. The goal will be to continue antiestrogen therapy for 5-10 years.  In the meanwhile, we are referring her back to Dr.  Carolynne Edouard for port removal.  Braylon will call with any changes or problems. She voices understanding and agreement with our plan.   Deonte Otting    02/14/2012

## 2012-02-15 ENCOUNTER — Telehealth: Payer: Self-pay | Admitting: Oncology

## 2012-02-15 NOTE — Telephone Encounter (Signed)
lmonvm of the pt advising her of the mammo appt at the [facility] breast center in july

## 2012-02-15 NOTE — Telephone Encounter (Signed)
Pt is aware of her July 2013 appt per laurie scheduled the md appt

## 2012-02-16 ENCOUNTER — Ambulatory Visit: Payer: Medicaid Other | Admitting: Obstetrics and Gynecology

## 2012-02-29 NOTE — Progress Notes (Signed)
DIAGNOSIS:  Right breast cancer.  NARRATIVE:  On Jan 17, 2012 Mrs. Gunnarson underwent additional planning for radiation therapy directed at the right chest wall area.  The patient had setup of a custom electron cutout field directed at the mastectomy scar region.  The patient will be treated with a custom electron cutout using 9 MeV electrons prescribed to the 92% isodose line.  To ensure adequate dose to the superficial aspects of the target area, a 0.8 cm bolus will be placed daily during the patient's treatment.    ______________________________ [name], Ph.D., M.D. JDK/MEDQ  D:  02/23/2012  T:  02/29/2012  Job:  [id number]

## 2012-03-02 DIAGNOSIS — C50919 Malignant neoplasm of unspecified site of unspecified female breast: Secondary | ICD-10-CM | POA: Insufficient documentation

## 2012-03-05 ENCOUNTER — Encounter: Payer: Self-pay | Admitting: Radiation Oncology

## 2012-03-05 ENCOUNTER — Ambulatory Visit
Admission: RE | Admit: 2012-03-05 | Discharge: 2012-03-05 | Disposition: A | Discharge status: Home / Self Care | Payer: Medicaid Other | Source: Ambulatory Visit | Attending: Radiation Oncology | Admitting: Radiation Oncology

## 2012-03-05 VITALS — BP 130/62 | HR 80 | Temp 98.2°F | Resp 20 | Wt 316.6 lb

## 2012-03-05 DIAGNOSIS — C50919 Malignant neoplasm of unspecified site of unspecified female breast: Secondary | ICD-10-CM

## 2012-03-05 NOTE — Progress Notes (Signed)
  Radiation Oncology         [phone] ________________________________  Name: [name] MRN: [id number]  Date: 03/05/2012  DOB: Jan 11, 1976  Follow-Up Visit Note  CC: Tomma Lightning, MD  Magrinat, Valentino Hue, MD  Diagnosis:   Locally advanced right breast  Interval Since Last Radiation:  1 months  Narrative:  The patient returns today for routine follow-up.  She clinically seems to be doing well. She continues have some mild fatigue. She also has mild itching along the right chest wall area. She denies any swelling in her right arm or hand. She denies any cough or breathing pounds.                              ALLERGIES:  is allergic to oxycodone and tape.  Meds: Current Outpatient Prescriptions  Medication Sig Dispense Refill  . anastrozole (ARIMIDEX) 1 MG tablet Take 1 tablet (1 mg total) by mouth daily.  30 tablet  5  . B Complex-C (SUPER B COMPLEX PO) Take 1 tablet by mouth daily.      . benazepril-hydrochlorthiazide (LOTENSIN HCT) 20-12.5 MG per tablet Take 1 tablet by mouth 2 (two) times daily.      Marland Kitchen goserelin (ZOLADEX) 10.8 MG injection Inject 10.8 mg into the skin every 3 (three) months.      . pantoprazole (PROTONIX) 40 MG tablet TAKE ONE TABLET BY MOUTH EVERY DAY  30 tablet  0  . ranitidine (ZANTAC) 75 MG tablet Take 75 mg by mouth 2 (two) times daily.      . ferrous sulfate (FERRO-BOB) 325 (65 FE) MG tablet Take 325 mg by mouth daily with breakfast.      . DISCONTD: potassium chloride SA (K-DUR,KLOR-CON) 20 MEQ tablet Take 1 tablet (20 mEq total) by mouth 2 (two) times daily.  60 tablet  1    Physical Findings: The patient is in no acute distress. Patient is alert and oriented.  weight is 316 lb 9.6 oz (143.609 kg). Her temperature is 98.2 F (36.8 C). Her blood pressure is 130/62 and her pulse is 80. Her respiration is 20. .  The lungs are clear. The heart has a regular rhythm and rate. There is no palpable supraclavicular or axillary adenopathy. The right chest  wall area is healed at this time. She continues to have significant hyperpigmentation changes. There is no palpable or visible signs recurrence along the right chest wall region.  Lab Findings: Lab Results  Component Value Date   WBC 5.5 01/18/2012   HGB 11.2* 01/18/2012   HCT 34.9 01/18/2012   MCV 80.0 01/18/2012   PLT 271 01/18/2012    @LASTCHEM @  Radiographic Findings: No results found.  Impression:  The patient is recovering from the effects of radiation. For  the patient's itching I recommend she use hydrocortisone cream as needed. The patient will continue using radiaplex on her skin.  Plan:  Routine followup in 6 months.  _____________________________________    [name], PhD, MD

## 2012-03-05 NOTE — Progress Notes (Signed)
Patient arrived ,alert,oriented x3,steady gait, right chest wall well healed, still has dark pigmentation, skin intact, patient having hot flashes, Zoladex given 01/18/12, 1st time, vitals, 98.2, b/p. 130/62, p=/80 , rr=20 Occasional shooting pains still in right chest wall stated, fair appetite, drinking plenty fluids 8:46 AM

## 2012-03-20 ENCOUNTER — Encounter (INDEPENDENT_AMBULATORY_CARE_PROVIDER_SITE_OTHER): Payer: Self-pay | Admitting: General Surgery

## 2012-03-20 ENCOUNTER — Ambulatory Visit (INDEPENDENT_AMBULATORY_CARE_PROVIDER_SITE_OTHER): Payer: Medicaid Other | Admitting: General Surgery

## 2012-03-20 VITALS — BP 124/86 | HR 99 | Temp 97.2°F | Ht 67.0 in | Wt 313.4 lb

## 2012-03-20 DIAGNOSIS — C50919 Malignant neoplasm of unspecified site of unspecified female breast: Secondary | ICD-10-CM

## 2012-03-20 NOTE — Progress Notes (Signed)
Subjective:     Patient ID: [name], female   DOB: 03/19/1976, 36 y.o.   MRN: [id number]  HPI The patient is a 68 her white female who is 5 months out from a right mastectomy and sentinel node mapping for a T2 N1 MIC right breast. She is ER/PR positive and HER-2 negative. She finished radiation in the middle of January. She tolerated this well. She has some mild soreness of her chest wall but she seems to be tolerating this well. She states she did have a recent mammogram of the left breast. She also has noticed a small lump at one of her drain exit sites. Otherwise she has no complaints  Review of Systems  Constitutional: Negative.   HENT: Negative.   Eyes: Negative.   Respiratory: Negative.   Cardiovascular: Negative.   Gastrointestinal: Negative.   Genitourinary: Negative.   Musculoskeletal: Negative.   Skin: Negative.   Neurological: Negative.   Hematological: Negative.   Psychiatric/Behavioral: Negative.        Objective:   Physical Exam  Constitutional: She is oriented to person, place, and time. She appears well-developed and well-nourished.  HENT:  Head: Normocephalic and atraumatic.  Eyes: Conjunctivae and EOM are normal. Pupils are equal, round, and reactive to light.  Neck: Normal range of motion. Neck supple.  Cardiovascular: Normal rate, regular rhythm and normal heart sounds.   Pulmonary/Chest: Effort normal and breath sounds normal.       The skin of her right chest wall is healing well after radiation. She has no palpable mass of the right chest wall. No palpable mass of the left breast. No palpable axillary supraclavicular or cervical lymphadenopathy. The small mass at her drain exit site feels like scar.  Abdominal: Soft. Bowel sounds are normal. She exhibits no mass. There is no tenderness.  Musculoskeletal: Normal range of motion.  Lymphadenopathy:    She has no cervical adenopathy.  Neurological: She is alert and oriented to person, place, and time.    Skin: Skin is warm and dry.  Psychiatric: She has a normal mood and affect. Her behavior is normal.       Assessment:     5 months status post right mastectomy    Plan:     At this point she will continue to take Arimidex and Zoladex. She will continue regular self exams. She will see Dr. Darnelle Catalan in one month. We will plan to see her back in 4 months

## 2012-03-20 NOTE — Patient Instructions (Signed)
Ok to participate with physical therapy Continue arimidex Continue regular self exams

## 2012-04-04 ENCOUNTER — Other Ambulatory Visit (HOSPITAL_BASED_OUTPATIENT_CLINIC_OR_DEPARTMENT_OTHER): Payer: Medicaid Other | Admitting: Lab

## 2012-04-04 DIAGNOSIS — D509 Iron deficiency anemia, unspecified: Secondary | ICD-10-CM

## 2012-04-04 DIAGNOSIS — C50919 Malignant neoplasm of unspecified site of unspecified female breast: Secondary | ICD-10-CM

## 2012-04-04 LAB — COMPREHENSIVE METABOLIC PANEL
ALT: 24 U/L (ref 0–35)
AST: 26 U/L (ref 0–37)
Calcium: 9.8 mg/dL (ref 8.4–10.5)
Chloride: 101 mEq/L (ref 96–112)
Creatinine, Ser: 0.69 mg/dL (ref 0.50–1.10)
Potassium: 3.2 mEq/L — ABNORMAL LOW (ref 3.5–5.3)
Sodium: 141 mEq/L (ref 135–145)

## 2012-04-04 LAB — CBC WITH DIFFERENTIAL/PLATELET
BASO%: 0.3 % (ref 0.0–2.0)
MCHC: 32.4 g/dL (ref 31.5–36.0)
MONO#: 0.4 10*3/uL (ref 0.1–0.9)
RBC: 4.21 10*6/uL (ref 3.70–5.45)
RDW: 14.7 % — ABNORMAL HIGH (ref 11.2–14.5)
WBC: 5.8 10*3/uL (ref 3.9–10.3)
lymph#: 0.8 10*3/uL — ABNORMAL LOW (ref 0.9–3.3)
nRBC: 1 % — ABNORMAL HIGH (ref 0–0)

## 2012-04-04 LAB — CANCER ANTIGEN 27.29: CA 27.29: 12 U/mL (ref 0–39)

## 2012-04-11 ENCOUNTER — Ambulatory Visit: Payer: Medicaid Other | Admitting: Physician Assistant

## 2012-04-13 ENCOUNTER — Ambulatory Visit (HOSPITAL_BASED_OUTPATIENT_CLINIC_OR_DEPARTMENT_OTHER): Payer: Medicaid Other | Admitting: Physician Assistant

## 2012-04-13 ENCOUNTER — Telehealth: Payer: Self-pay | Admitting: Oncology

## 2012-04-13 ENCOUNTER — Encounter: Payer: Self-pay | Admitting: Physician Assistant

## 2012-04-13 ENCOUNTER — Ambulatory Visit (HOSPITAL_BASED_OUTPATIENT_CLINIC_OR_DEPARTMENT_OTHER): Payer: Medicaid Other

## 2012-04-13 VITALS — BP 130/92 | HR 93 | Temp 98.9°F | Resp 20 | Ht 67.0 in | Wt 315.4 lb

## 2012-04-13 DIAGNOSIS — R7309 Other abnormal glucose: Secondary | ICD-10-CM

## 2012-04-13 DIAGNOSIS — Z5111 Encounter for antineoplastic chemotherapy: Secondary | ICD-10-CM

## 2012-04-13 DIAGNOSIS — E876 Hypokalemia: Secondary | ICD-10-CM

## 2012-04-13 DIAGNOSIS — R071 Chest pain on breathing: Secondary | ICD-10-CM

## 2012-04-13 DIAGNOSIS — C50419 Malignant neoplasm of upper-outer quadrant of unspecified female breast: Secondary | ICD-10-CM

## 2012-04-13 DIAGNOSIS — R7301 Impaired fasting glucose: Secondary | ICD-10-CM

## 2012-04-13 DIAGNOSIS — Z170 Estrogen receptor positive status [ER+]: Secondary | ICD-10-CM

## 2012-04-13 DIAGNOSIS — C50919 Malignant neoplasm of unspecified site of unspecified female breast: Secondary | ICD-10-CM

## 2012-04-13 MED ORDER — GABAPENTIN 300 MG PO CAPS: 300.0000 mg | ORAL_CAPSULE | Freq: Every day | ORAL | Status: DC

## 2012-04-13 MED ORDER — GOSERELIN ACETATE 10.8 MG ~~LOC~~ IMPL
10.8000 mg | DRUG_IMPLANT | SUBCUTANEOUS | Status: DC
  Administered 2012-04-13: 10.8 mg via SUBCUTANEOUS
  Filled 2012-04-13: qty 10.8

## 2012-04-13 NOTE — Progress Notes (Signed)
ID: [name]   DOB: 1976-05-11  MR#: [id number]  NWG#:[id number]  HISTORY OF PRESENT ILLNESS: The patient developed pain in her right breast and brought it to her primary care physician's attention. Mammography at [facility] 03/10/2011 found the breasts to be heterogeneously dense. There was a mildly irregular mass posteriorly in the upper outer quadrant of the right breast. On ultrasound this was hypoechoic and measured approximately 2.4 cm, with irregular margins. The left breast was unremarkable.  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 Bilateral breast MRIs were obtained 03/18/2011, showing in the upper outer quadrant of the right breast an irregular mass measuring 5.5 cm. There was a suggestion of cortical thickening in the level I right axillary lymph node, which measured 1.2 cm. The patient was staged with chest CT scan and PET scan, which showed no distant disease. These studies did confirm the abnormality in the right breast as well as a hypermetabolic right axillary lymph node, making this a clinical T3 N1 or stage III invasive ductal carcinoma.  She completed neoadjuvant chemotherapy and proceeded to surgery and subsequent treatment as detailed below..  INTERVAL HISTORY: [name] returns today for followup of her breast cancer. She is ready for her second Zoladex injection today which she receives every 3 months. She started on anastrozole approximately 2 months ago in late June, and with the exception of increased hot flashes, is tolerating the medication well.  [name] has had a difficult several weeks. Her mother passed away somewhat unexpectedly in late July from a stroke. [name] continues to work at [employer], but is not getting enough hours, averaging about 5 hours weekly which "isn't enough to pay for the gas to  drive there". Of course she has 2 teenagers at home, both of whom are getting ready to start back in school.    REVIEW OF SYSTEMS: [name] continues to complain of fatigue and muscle weakness. She is trying to walk around the neighborhood, but tells me her leg muscles feel weak. She also has some cramping in her legs on occasion, especially at night. The hot flashes interrupt her sleep. She wakes up several times during the night. No increased joint pain. She continues to have some lower back pain. She is having some postsurgical pain in the right chest wall the site of the mastectomy. She continues to be followed by her primary care physician for hypertension which at this time seems to be under control. She's had no recent illnesses and denies fevers, chills, or night sweats.  No skin changes or abnormal bleeding. No vaginal bleeding. She's had no nausea or change in bowel habits. No chest pain, palpitations, or increased shortness of breath. No abnormal headaches or dizziness.   A detailed review of systems is otherwise noncontributory.  PAST MEDICAL HISTORY: Past Medical History  Diagnosis Date  . Hypertension   . Breast pain   . Abdominal pain   . Back pain   . Heart palpitations   . Shortness of breath   . Ulcer of abdomen wall   . Depression   . Anemia   . Cancer     rt breast  . Blood transfusion 08/2011  . GERD (gastroesophageal reflux disease)   . Status post chemotherapy     doxorubicin/cyclophosphamide  . Breast cancer 03/14/11    dx right breast=invasive ductal ca,dcis, ER/pr=positive  .  Peripheral neuropathy     s/p chemotherapy  . H/O varicella   . Headache     hx migraine in childhood  . H/O scoliosis   . H/O chlamydia infection   . Trichomonas   . Abnormal Pap smear 2000  . Preterm labor   . Postpartum depression   . Breast cancer, female 2012    PAST SURGICAL HISTORY: Past Surgical History  Procedure Date  . Hip pins 36 y/o    pins placed in both hips   .  Portacath placement   . Complete mastectomy w/ sentinel node biopsy 10/14/11    Dr. Carolynne Edouard  Right breast   . Breast surgery   . Mastectomy w/ sentinel node biopsy 10/14/2011    Procedure: MASTECTOMY WITH SENTINEL LYMPH NODE BIOPSY;  Surgeon: Robyne Askew, MD;  Location: [facility] OR;  Service: General;  Laterality: Right;  . Wound exploration -22-13    s/p R maste; burke thompson    FAMILY HISTORY Family History  Problem Relation Age of Onset  . Heart disease Father     heart attack  . Heart attack Father     died age 62  . Hypertension Father   . Hypertension Mother   . Anemia Mother   . Diabetes Mother   . Anemia Sister   . Heart disease Sister     MI  . Hypertension Brother   . Diabetes Brother   . Heart disease Brother     Mi  . Heart disease Maternal Grandmother     MI  . Hypertension Maternal Grandmother   . Diabetes Maternal Grandmother   . Hypertension Paternal Grandmother   [name] died from a stroke at age 73. The patient's father died from a myocardial infarction at the age of 30. She has one sister and one brother. There is no history of breast or ovarian cancer in the family.   GYNECOLOGIC HISTORY: Menarche age 2. She was premenopausal at the time of diagnosis. She has never used birth control pills. She is GX P2. First pregnancy to term age 89. She had her last period at the time of her surgery.  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 Her husband of 3 years, Laquanda Bick, works in Holiday representative. Daughters, [place], 17, and [place], [place], both live at home with the patient and her husband.    ADVANCED DIRECTIVES:  HEALTH MAINTENANCE: History  Substance Use Topics  . Smoking status: Never Smoker   . Smokeless tobacco: Never Used   Comment: quit 1998  . Alcohol Use: No     Colonoscopy:  PAP:  Bone density:  Lipid panel:  Allergies  Allergen Reactions  . Oxycodone Other (See Comments)    headache  . Tape      "Tears my skin up"    Current Outpatient Prescriptions  Medication Sig Dispense Refill  . anastrozole (ARIMIDEX) 1 MG tablet Take 1 tablet (1 mg total) by mouth daily.  30 tablet  5  . B Complex-C (SUPER B COMPLEX PO) Take 1 tablet by mouth daily.      . benazepril-hydrochlorthiazide (LOTENSIN HCT) 20-12.5 MG per tablet Take 1 tablet by mouth 2 (two) times daily.      . ferrous sulfate (FERRO-BOB) 325 (65 FE) MG tablet Take 325 mg by mouth daily with breakfast.      . gabapentin (NEURONTIN) 300 MG capsule Take 1 capsule (300 mg total) by mouth at bedtime.  30 capsule  3  . goserelin (ZOLADEX) 10.8 MG injection Inject 10.8 mg into the skin every 3 (three) months.      . pantoprazole (PROTONIX) 40 MG tablet TAKE ONE TABLET BY MOUTH EVERY DAY  30 tablet  0  . ranitidine (ZANTAC) 75 MG tablet Take 75 mg by mouth 2 (two) times daily.      Marland Kitchen DISCONTD: potassium chloride SA (K-DUR,KLOR-CON) 20 MEQ tablet Take 1 tablet (20 mEq total) by mouth 2 (two) times daily.  60 tablet  1   Current Facility-Administered Medications  Medication Dose Route Frequency Provider Last Rate Last Dose  . goserelin (ZOLADEX) injection 10.8 mg  10.8 mg Subcutaneous Q90 days Lowella Dell, MD   10.8 mg at 04/13/12 1538    OBJECTIVE: Young African American woman who appears fatigued but in no acute distress. Filed Vitals:   04/13/12 1415  BP: 130/92  Pulse: 93  Temp: 98.9 F (37.2 C)  Resp: 20     Body mass index is 49.40 kg/(m^2).    ECOG FS: 1  Filed Weights   04/13/12 1415  Weight: 315 lb 6.4 oz (143.065 kg)   Physical Exam: HEENT:  Sclerae anicteric.  Oropharynx clear.   Nodes:  No cervical, supraclavicular, or axillary lymphadenopathy palpated.  Breast Exam:  Left breast is benign. Patient status post right mastectomy with no suspicious nodularities in the chest wall. Skin is hyperpigmented status post radiation therapy, with no additional skin changes noted. Lungs:  Clear to auscultation bilaterally.   No crackles, rhonchi, or wheezes.   Heart:  Regular rate and rhythm.   Abdomen:  Soft, obese, nontender.  Positive bowel sounds.   Musculoskeletal:  No focal spinal tenderness to palpation.  Extremities:  Benign.     Neuro:  Nonfocal. Alert and oriented x3.    LAB RESULTS: Lab Results  Component Value Date   WBC 5.8 04/04/2012   NEUTROABS 4.5 04/04/2012   HGB 10.7* 04/04/2012   HCT 33.0* 04/04/2012   MCV 78.4* 04/04/2012   PLT 329 04/04/2012      Chemistry      Component Value Date/Time   NA 141 04/04/2012 1456   K 3.2* 04/04/2012 1456   CL 101 04/04/2012 1456   CO2 30 04/04/2012 1456   BUN 14 04/04/2012 1456   CREATININE 0.69 04/04/2012 1456      Component Value Date/Time   CALCIUM 9.8 04/04/2012 1456   ALKPHOS 113 04/04/2012 1456   AST 26 04/04/2012 1456   ALT 24 04/04/2012 1456   BILITOT 0.4 04/04/2012 1456       Lab Results  Component Value Date   LABCA2 12 04/04/2012    STUDIES:  Left mammogram at [facility] on 03/12/2012 was unremarkable.    ASSESSMENT: 36 year old BRCA 1-2 negative [place] woman   (1) status post right breast biopsy July of 2012 for a clinical T3 N1 (stage III) invasive ductal carcinoma, grade 3, 99% estrogen and  100% progesterone receptor positive, HER-2 negative, with an MIB-1 of 98%.   (2) Status post 4 cycles of dose dense doxorubicin and cyclophosphamide, followed by weekly paclitaxel x 8, completed 08/25/2011  (3) s/p right mastectomy and sentinel lymph node sampling 10/14/2011 for a residual ypT2 ypN1(mic) invasive ductal carcinoma, grade 3, with ample margins  (4) status post radiation therapy under the care of Dr. Roselind Messier,  completed in mid June.  (5)  Anti-estrogen therapy following radiation, with first Zoladex injection given on 01/18/2012, and to be continued every 3 months. Patient has  a history of DVT, and was started on anastrozole rather than tamoxifen, the anastrazole started in late June 2013.  PLAN:  Bruna will receive her next  injection of Zoladex today, and will continue on anastrozole 1 mg daily. I have encouraged her to start back on her potassium supplements 20 mEq daily, and to increase her dietary potassium intake. With this will help with her cramping. We will also try gabapentin 300 mg at night in hopes this will help not only with hot flashes, but also with her postsurgical pain in the right chest wall.  Finally, Carmen is very concerned about the elevated glucose of  170 on 04/04/2012. She tells me this really was fasting, and that she "had not eating all day". She tells me she has a family history of diabetes. For her convenience, we will repeat a metabolic panel today, and I will add a hemoglobin A1c. If this is in fact elevated, we will refer her back to her primary care physician, Dr.  Isabella Stalling.  I will see Taysha back in 3 months for labs and physical exam when she is due for her next injection of Zoladex.  Yuki will call with any changes or problems. She voices understanding and agreement with our plan.   Kasha Howeth    04/13/2012

## 2012-04-13 NOTE — Progress Notes (Signed)
I had requested that [name] return to the lab today after our visit to repeat her metabolic panel and draw a hemoglobin A1c. I was contacted by our lab at 4:30 this afternoon to let me know that [name] "didn't show up" and was not in the lobby.  Unfortunately, [name] told me during our appointment at both her cell phone and home phone were disconnected. She is supposed to contact us on Monday, and if not, I will followup with a letter mailed to her home address.  Zollie Scale, PA-C  04/13/2012

## 2012-04-13 NOTE — Telephone Encounter (Signed)
gve the pt her nov 2013 appt calendar °

## 2012-06-28 ENCOUNTER — Other Ambulatory Visit: Payer: Medicaid Other | Admitting: Lab

## 2012-06-29 ENCOUNTER — Other Ambulatory Visit (HOSPITAL_BASED_OUTPATIENT_CLINIC_OR_DEPARTMENT_OTHER): Payer: Medicaid Other | Admitting: Lab

## 2012-06-29 DIAGNOSIS — R7309 Other abnormal glucose: Secondary | ICD-10-CM

## 2012-06-29 DIAGNOSIS — C50919 Malignant neoplasm of unspecified site of unspecified female breast: Secondary | ICD-10-CM

## 2012-06-29 LAB — COMPREHENSIVE METABOLIC PANEL (CC13)
ALT: 22 U/L (ref 0–55)
AST: 20 U/L (ref 5–34)
CO2: 29 mEq/L (ref 22–29)
Chloride: 103 mEq/L (ref 98–107)
Sodium: 143 mEq/L (ref 136–145)
Total Bilirubin: 0.48 mg/dL (ref 0.20–1.20)
Total Protein: 7.5 g/dL (ref 6.4–8.3)

## 2012-06-29 LAB — CBC WITH DIFFERENTIAL/PLATELET
Basophils Absolute: 0 10*3/uL (ref 0.0–0.1)
EOS%: 2.2 % (ref 0.0–7.0)
HGB: 11.5 g/dL — ABNORMAL LOW (ref 11.6–15.9)
MCH: 25.9 pg (ref 25.1–34.0)
MCV: 79.9 fL (ref 79.5–101.0)
MONO%: 5.9 % (ref 0.0–14.0)
NEUT%: 78.8 % — ABNORMAL HIGH (ref 38.4–76.8)
RDW: 13.4 % (ref 11.2–14.5)

## 2012-07-06 ENCOUNTER — Ambulatory Visit (HOSPITAL_BASED_OUTPATIENT_CLINIC_OR_DEPARTMENT_OTHER): Payer: Medicaid Other | Admitting: Physician Assistant

## 2012-07-06 ENCOUNTER — Encounter: Payer: Self-pay | Admitting: Physician Assistant

## 2012-07-06 ENCOUNTER — Encounter: Payer: Self-pay | Admitting: Gastroenterology

## 2012-07-06 ENCOUNTER — Telehealth: Payer: Self-pay | Admitting: *Deleted

## 2012-07-06 VITALS — BP 149/98 | HR 81 | Temp 99.1°F | Resp 20 | Ht 67.0 in | Wt 316.5 lb

## 2012-07-06 DIAGNOSIS — D509 Iron deficiency anemia, unspecified: Secondary | ICD-10-CM

## 2012-07-06 DIAGNOSIS — R7309 Other abnormal glucose: Secondary | ICD-10-CM

## 2012-07-06 DIAGNOSIS — Z170 Estrogen receptor positive status [ER+]: Secondary | ICD-10-CM

## 2012-07-06 DIAGNOSIS — C50919 Malignant neoplasm of unspecified site of unspecified female breast: Secondary | ICD-10-CM

## 2012-07-06 DIAGNOSIS — R110 Nausea: Secondary | ICD-10-CM

## 2012-07-06 DIAGNOSIS — C50419 Malignant neoplasm of upper-outer quadrant of unspecified female breast: Secondary | ICD-10-CM

## 2012-07-06 DIAGNOSIS — Z5111 Encounter for antineoplastic chemotherapy: Secondary | ICD-10-CM

## 2012-07-06 DIAGNOSIS — K219 Gastro-esophageal reflux disease without esophagitis: Secondary | ICD-10-CM

## 2012-07-06 MED ORDER — ANASTROZOLE 1 MG PO TABS: 1.0000 mg | ORAL_TABLET | Freq: Every day | ORAL | Status: DC

## 2012-07-06 MED ORDER — GOSERELIN ACETATE 10.8 MG ~~LOC~~ IMPL
10.8000 mg | DRUG_IMPLANT | SUBCUTANEOUS | Status: DC
  Administered 2012-07-06: 10.8 mg via SUBCUTANEOUS
  Filled 2012-07-06: qty 10.8

## 2012-07-06 NOTE — Telephone Encounter (Signed)
Gave patient appointment for 08-01-2012 with dr.jacobs at 10:30am  Gave patient appointment for 07-30-2012 will see dr.toth

## 2012-07-06 NOTE — Progress Notes (Signed)
ID: [name]   DOB: 05/30/1976  MR#: [id number]  WUJ#:[id number]  HISTORY OF PRESENT ILLNESS: The patient developed pain in her right breast and brought it to her primary care physician's attention. Mammography at [facility] 03/10/2011 found the breasts to be heterogeneously dense. There was a mildly irregular mass posteriorly in the upper outer quadrant of the right breast. On ultrasound this was hypoechoic and measured approximately 2.4 cm, with irregular margins. The left breast was unremarkable.  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 Bilateral breast MRIs were obtained 03/18/2011, showing in the upper outer quadrant of the right breast an irregular mass measuring 5.5 cm. There was a suggestion of cortical thickening in the level I right axillary lymph node, which measured 1.2 cm. The patient was staged with chest CT scan and PET scan, which showed no distant disease. These studies did confirm the abnormality in the right breast as well as a hypermetabolic right axillary lymph node, making this a clinical T3 N1 or stage III invasive ductal carcinoma.  She completed neoadjuvant chemotherapy and proceeded to surgery and subsequent treatment as detailed below..  INTERVAL HISTORY: [name] returns today for followup of her breast cancer. She is ready for her Zoladex injection today which she receives every 3 months. She continues on anastrozole which she is tolerating well. She has occasional hot flashes but denies any increased vaginal dryness or increased joint pain.  [name] is concerned about some increased problems with reflux. She takes Protonix but notes that the reflexes not well controlled. She occasionally feels "tight" when she is swallowing and complains of associated nausea, a dry cough, and mild abdominal pain. To the  best of her knowledge, she has never had an endoscopy. She denies any hemoptysis and has had no change in bowel habits.   Otherwise, [name] stays very busy. 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 She is going back to school, working on a degree in social work. Of course she also has 2 teenagers at home.  REVIEW OF SYSTEMS: Iasia continues to complain of fatigue and muscle weakness. She walks occasionally, but otherwise denies regular exercise. She continues to have cramping, primarily in the right leg and especially at night. She continues to have some lower back pain which is chronic.  She's had no recent illnesses and denies fevers, chills, or night sweats.  No skin changes or abnormal bleeding. No vaginal bleeding. She's had no chest pain, palpitations, or increased shortness of breath. No abnormal headaches or dizziness.   A detailed review of systems is otherwise noncontributory.  PAST MEDICAL HISTORY: Past Medical History  Diagnosis Date  . Hypertension   . Breast pain   . Abdominal pain   . Back pain   . Heart palpitations   . Shortness of breath   . Ulcer of abdomen wall   . Depression   . Anemia   . Cancer     rt breast  . Blood transfusion 08/2011  . GERD (gastroesophageal reflux disease)   . Status post chemotherapy     doxorubicin/cyclophosphamide  . Breast cancer 03/14/11    dx right breast=invasive ductal ca,dcis, ER/pr=positive  . Peripheral neuropathy     s/p chemotherapy  . H/O varicella   . Headache     hx migraine in childhood  . H/O scoliosis   .  H/O chlamydia infection   . Trichomonas   . Abnormal Pap smear 2000  . Preterm labor   . Postpartum depression   . Breast cancer, female 2012    PAST SURGICAL HISTORY: Past Surgical History  Procedure Date  . Hip pins 36 y/o    pins placed in both hips   . Portacath placement   . Complete mastectomy w/ sentinel node biopsy 10/14/11    Dr. Carolynne Edouard  Right breast   . Breast surgery   . Mastectomy w/  sentinel node biopsy 10/14/2011    Procedure: MASTECTOMY WITH SENTINEL LYMPH NODE BIOPSY;  Surgeon: Robyne Askew, MD;  Location: [facility] OR;  Service: General;  Laterality: Right;  . Wound exploration -22-13    s/p R maste; burke thompson    FAMILY HISTORY Family History  Problem Relation Age of Onset  . Heart disease Father     heart attack  . Heart attack Father     died age 36  . Hypertension Father   . Hypertension Mother   . Anemia Mother   . Diabetes Mother   . Anemia Sister   . Heart disease Sister     MI  . Hypertension Brother   . Diabetes Brother   . Heart disease Brother     Mi  . Heart disease Maternal Grandmother     MI  . Hypertension Maternal Grandmother   . Diabetes Maternal Grandmother   . Hypertension Paternal Grandmother   [name] died from a stroke at age 64. The patient's father died from a myocardial infarction at the age of 40. She has one sister and one brother. There is no history of breast or ovarian cancer in the family.   GYNECOLOGIC HISTORY: Menarche age 36. She was premenopausal at the time of diagnosis. She has never used birth control pills. She is GX P2. First pregnancy to term age 78. She had her last period at the time of her surgery.  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 Her husband of 3 years, [name], works in Holiday representative. Daughters, [place], 17, and [place], [place], both live at home with the patient and her husband.    ADVANCED DIRECTIVES:  HEALTH MAINTENANCE: History  Substance Use Topics  . Smoking status: Never Smoker   . Smokeless tobacco: Never Used     Comment: quit 1998  . Alcohol Use: No     Colonoscopy:  PAP:  Bone density:  Lipid panel:  Allergies  Allergen Reactions  . Oxycodone Other (See Comments)    headache  . Tape     "Tears my skin up"    Current Outpatient Prescriptions  Medication Sig Dispense Refill  . anastrozole (ARIMIDEX) 1 MG tablet Take 1  tablet (1 mg total) by mouth daily.  30 tablet  5  . B Complex-C (SUPER B COMPLEX PO) Take 1 tablet by mouth daily.      . benazepril-hydrochlorthiazide (LOTENSIN HCT) 20-12.5 MG per tablet Take 1 tablet by mouth 2 (two) times daily.      Marland Kitchen goserelin (ZOLADEX) 10.8 MG injection Inject 10.8 mg into the skin every 3 (three) months.      . pantoprazole (PROTONIX) 40 MG tablet TAKE ONE TABLET BY MOUTH EVERY DAY  30 tablet  0  . ferrous sulfate (FERRO-BOB) 325 (65 FE) MG tablet Take 325 mg by mouth daily with breakfast.      . gabapentin (NEURONTIN) 300 MG capsule Take 1 capsule (  300 mg total) by mouth at bedtime.  30 capsule  3  . ranitidine (ZANTAC) 75 MG tablet Take 75 mg by mouth 2 (two) times daily.      . [DISCONTINUED] potassium chloride SA (K-DUR,KLOR-CON) 20 MEQ tablet Take 1 tablet (20 mEq total) by mouth 2 (two) times daily.  60 tablet  1   Current Facility-Administered Medications  Medication Dose Route Frequency Provider Last Rate Last Dose  . goserelin (ZOLADEX) injection 10.8 mg  10.8 mg Subcutaneous Q90 days Lowella Dell, MD   10.8 mg at 07/06/12 1218    OBJECTIVE: Young African American woman who appears tired but in no acute distress. Filed Vitals:   07/06/12 1201  BP: 149/98  Pulse: 81  Temp: 99.1 F (37.3 C)  Resp: 20     Body mass index is 49.57 kg/(m^2).    ECOG FS: 1  Filed Weights   07/06/12 1201  Weight: 316 lb 8 oz (143.563 kg)   Physical Exam: HEENT:  Sclerae anicteric.  Oropharynx clear.   Nodes:  No cervical, supraclavicular, or axillary lymphadenopathy palpated.  Breast Exam:  Left breast is unremarkable. Patient status post right mastectomy with no suspicious nodularities in the chest wall. Skin is hyperpigmented status post radiation therapy, with no additional skin changes noted. Port is intact in the left upper chest wall Lungs:  Clear to auscultation bilaterally.  No crackles, rhonchi, or wheezes.   Heart:  Regular rate and rhythm.   Abdomen:   Soft, obese, nontender.  Positive bowel sounds.   Musculoskeletal:  No focal spinal tenderness to palpation.  Extremities:  No peripheral edema    Neuro:  Nonfocal. Alert and oriented x3.    LAB RESULTS: Lab Results  Component Value Date   WBC 6.4 06/29/2012   NEUTROABS 5.0 06/29/2012   HGB 11.5* 06/29/2012   HCT 35.6 06/29/2012   MCV 79.9 06/29/2012   PLT 339 06/29/2012      Chemistry      Component Value Date/Time   NA 143 06/29/2012 1113   NA 141 04/04/2012 1456   K 3.5 06/29/2012 1113   K 3.2* 04/04/2012 1456   CL 103 06/29/2012 1113   CL 101 04/04/2012 1456   CO2 29 06/29/2012 1113   CO2 30 04/04/2012 1456   BUN 13.0 06/29/2012 1113   BUN 14 04/04/2012 1456   CREATININE 0.8 06/29/2012 1113   CREATININE 0.69 04/04/2012 1456      Component Value Date/Time   CALCIUM 10.5* 06/29/2012 1113   CALCIUM 9.8 04/04/2012 1456   ALKPHOS 124 06/29/2012 1113   ALKPHOS 113 04/04/2012 1456   AST 20 06/29/2012 1113   AST 26 04/04/2012 1456   ALT 22 06/29/2012 1113   ALT 24 04/04/2012 1456   BILITOT 0.48 06/29/2012 1113   BILITOT 0.4 04/04/2012 1456       Lab Results  Component Value Date   LABCA2 17 06/29/2012    STUDIES:  Left mammogram at [facility] on 03/12/2012 was unremarkable.    ASSESSMENT: 36 year old BRCA 1-2 negative  woman   (1) status post right breast biopsy July of 2012 for a clinical T3 N1 (stage III) invasive ductal carcinoma, grade 3, 99% estrogen and  100% progesterone receptor positive, HER-2 negative, with an MIB-1 of 98%.   (2) Status post 4 cycles of dose dense doxorubicin and cyclophosphamide, followed by weekly paclitaxel x 8, completed 08/25/2011  (3) s/p right mastectomy and sentinel lymph node sampling 10/14/2011 for a residual ypT2 ypN1(mic) invasive  ductal carcinoma, grade 3, with ample margins  (4) status post radiation therapy under the care of Dr. Roselind Messier,  completed in mid June.  (5)  Anti-estrogen therapy following radiation, with first Zoladex  injection given on 01/18/2012, and to be continued every 3 months. Patient has a history of DVT, and was started on anastrozole rather than tamoxifen, the anastrazole started in late June 2013.  PLAN:  Katerra will receive her next injection of Zoladex today, and will continue on anastrozole 1 mg daily. The anastrozole has been refilled per her request.   I am concerned about her elevated glucose, and I have recommended she return to her primary care physician at  [facility] for further evaluation.   In addition, I am referring Almee back to Dr. Carolynne Edouard for port removal. I have also referred her [facility] for evaluation of reflux with nausea and abdominal discomfort.  We will see Felina back in 3 months for labs and physical exam when she is due for her next injection of Zoladex. We will also need to discuss obtaining a bone density exam at that time. Joshua will call with any changes or problems. She voices understanding and agreement with our plan.   Hazaiah Edgecombe    07/06/2012

## 2012-07-30 ENCOUNTER — Encounter (INDEPENDENT_AMBULATORY_CARE_PROVIDER_SITE_OTHER): Payer: Self-pay | Admitting: General Surgery

## 2012-07-30 ENCOUNTER — Ambulatory Visit (INDEPENDENT_AMBULATORY_CARE_PROVIDER_SITE_OTHER): Payer: Medicaid Other | Admitting: General Surgery

## 2012-07-30 VITALS — BP 134/82 | HR 97 | Temp 96.7°F | Ht 67.0 in | Wt 317.4 lb

## 2012-07-30 DIAGNOSIS — C50919 Malignant neoplasm of unspecified site of unspecified female breast: Secondary | ICD-10-CM

## 2012-07-30 NOTE — Patient Instructions (Signed)
Plan to remove port

## 2012-07-30 NOTE — Progress Notes (Signed)
Subjective:     Patient ID: [name], female   DOB: [birth year], 36 y.o.   MRN: [id number]  HPI The patient is a 36 year old black female who is 10 months status post right mastectomy and sentinel node biopsy for a T2 N1 MIC right breast cancer. She has finished chemoradiation therapy and is now taking arimidex. She is tolerating this well. She has no complaints today. She states that she would like to have her port removed  Review of Systems  Constitutional: Negative.   HENT: Negative.   Eyes: Negative.   Respiratory: Negative.   Cardiovascular: Negative.   Gastrointestinal: Negative.   Genitourinary: Negative.   Musculoskeletal: Negative.   Skin: Negative.   Neurological: Negative.   Hematological: Negative.   Psychiatric/Behavioral: Negative.        Objective:   Physical Exam  Constitutional: She is oriented to person, place, and time. She appears well-developed and well-nourished.  HENT:  Head: Normocephalic and atraumatic.  Eyes: Conjunctivae normal and EOM are normal. Pupils are equal, round, and reactive to light.  Neck: Normal range of motion. Neck supple.  Cardiovascular: Normal rate, regular rhythm and normal heart sounds.   Pulmonary/Chest: Effort normal and breath sounds normal.  Abdominal: Soft. Bowel sounds are normal. She exhibits no mass. There is no tenderness.  Musculoskeletal: Normal range of motion.  Lymphadenopathy:    She has no cervical adenopathy.  Neurological: She is alert and oriented to person, place, and time.  Skin: Skin is warm and dry.  Psychiatric: She has a normal mood and affect. Her behavior is normal.       Assessment:     10 months status post right mastectomy for breast cancer    Plan:     At this point she will continue to take arimidex. I discussed with her in detail the risks and benefits of the operation to remove the port as well as some of the technical aspects and she understands and wishes to proceed. We will plan for  port removal in the near future. Otherwise we'll see her back in about 3 months

## 2012-08-01 ENCOUNTER — Encounter: Payer: Self-pay | Admitting: Gastroenterology

## 2012-08-01 ENCOUNTER — Ambulatory Visit (INDEPENDENT_AMBULATORY_CARE_PROVIDER_SITE_OTHER): Payer: Medicaid Other | Admitting: Gastroenterology

## 2012-08-01 VITALS — BP 116/86 | HR 80 | Ht 67.25 in | Wt 321.5 lb

## 2012-08-01 DIAGNOSIS — R1013 Epigastric pain: Secondary | ICD-10-CM

## 2012-08-01 DIAGNOSIS — R1310 Dysphagia, unspecified: Secondary | ICD-10-CM

## 2012-08-01 NOTE — Patient Instructions (Addendum)
You will be set up for an upper endoscopy for epigastric pain, dysphagia (WL, MAC sedation). In meantime chew your food well, eat slowly.   Pt changed her date and time after this note was closed,  She was given the correct instructions and Noreene Larsson at [facility] was also notified to move the appt to 09/06/12 1230pm

## 2012-08-01 NOTE — Progress Notes (Signed)
HPI: This is a   very pleasant morbidly obese 36 year old woman whom I am meeting for the first time today.  Right sided breast cancer,  Treated with chemo, surgery, xrt.  On pills for 10 years.   When she swallows her throat closes.  When eating solid food she has dysphagia.  This has been a problem for about a year (had not had radiation at that point).  Had issues with oral thrush 3 times during chemo.  Has also been having periumb pain for about 2 months.  Burning senssation that is bad when laying on her stomach, sometimes also with eating. + nausea at times.  The discomfort can last an hour.  Review of systems: Pertinent positive and negative review of systems were noted in the above HPI section. Complete review of systems was performed and was otherwise normal.    Past Medical History  Diagnosis Date  . Hypertension   . Breast pain   . Abdominal pain   . Back pain   . Heart palpitations   . Shortness of breath   . Ulcer of abdomen wall   . Depression   . Anemia   . Breast cancer     right  . Blood transfusion 08/2011  . GERD (gastroesophageal reflux disease)   . Status post chemotherapy     doxorubicin/cyclophosphamide  . Breast cancer 03/14/11    dx right breast=invasive ductal ca,dcis, ER/pr=positive  . Peripheral neuropathy     s/p chemotherapy  . H/O varicella   . Headache     hx migraine in childhood  . H/O scoliosis   . H/O chlamydia infection   . Trichomonas   . Abnormal Pap smear 2000  . Preterm labor   . Postpartum depression   . Breast cancer, female 2012    Past Surgical History  Procedure Date  . Hip pins 36 y/o    pins placed in both hips   . Portacath placement   . Complete mastectomy w/ sentinel node biopsy 10/14/11    Dr. Carolynne Edouard  Right breast   . Breast surgery   . Mastectomy w/ sentinel node biopsy 10/14/2011    Procedure: MASTECTOMY WITH SENTINEL LYMPH NODE BIOPSY;  Surgeon: Robyne Askew, MD;  Location: [facility] OR;  Service: General;   Laterality: Right;  . Wound exploration -22-13    s/p R maste; burke thompson    Current Outpatient Prescriptions  Medication Sig Dispense Refill  . anastrozole (ARIMIDEX) 1 MG tablet Take 1 tablet (1 mg total) by mouth daily.  30 tablet  5  . B Complex-C (SUPER B COMPLEX PO) Take 1 tablet by mouth daily.      . benazepril-hydrochlorthiazide (LOTENSIN HCT) 20-12.5 MG per tablet Take 1 tablet by mouth 2 (two) times daily.      . ferrous sulfate (FERRO-BOB) 325 (65 FE) MG tablet Take 325 mg by mouth daily with breakfast.      . gabapentin (NEURONTIN) 300 MG capsule Take 1 capsule (300 mg total) by mouth at bedtime.  30 capsule  3  . goserelin (ZOLADEX) 10.8 MG injection Inject 10.8 mg into the skin every 3 (three) months.      . pantoprazole (PROTONIX) 40 MG tablet TAKE ONE TABLET BY MOUTH EVERY DAY  30 tablet  0  . Potassium (POTASSIMIN PO) Take by mouth.      . ranitidine (ZANTAC) 75 MG tablet Take 75 mg by mouth as needed. ONLY IF OUT OF PROTONIX      . [  DISCONTINUED] potassium chloride SA (K-DUR,KLOR-CON) 20 MEQ tablet Take 1 tablet (20 mEq total) by mouth 2 (two) times daily.  60 tablet  1    Allergies as of 08/01/2012 - Review Complete 08/01/2012  Allergen Reaction Noted  . Oxycodone Other (See Comments) 08/31/2011  . Tape  11/09/2011    Family History  Problem Relation Age of Onset  . Heart disease Father     heart attack  . Heart attack Father     died age 72  . Hypertension Father   . Hypertension Mother   . Anemia Mother   . Diabetes Mother   . Hypertension Brother   . Diabetes Brother   . Heart disease Maternal Grandmother     MI  . Hypertension Maternal Grandmother   . Diabetes Maternal Grandmother   . Hypertension Paternal Grandmother   . Stroke Mother   . Aneurysm Mother     brain  . Diabetes Paternal Grandmother     History   Social History  . Marital Status: Married    Spouse Name: N/A    Number of Children: 2  . Years of Education: N/A    Occupational History  .     Social History Main Topics  . Smoking status: Never Smoker   . Smokeless tobacco: Never Used     Comment: quit 1998  . Alcohol Use: Yes     Comment: ocass  . Drug Use: No  . Sexually Active: Yes    Birth Control/ Protection: None     Comment: menerache age 3, GxP2,1st pregnancy age 58, 1 miscarriage   Other Topics Concern  . Not on file   Social History Narrative  . No narrative on file       Physical Exam: BP 116/86  Pulse 80  Ht 5' 7.25" (1.708 m)  Wt 321 lb 8 oz (145.831 kg)  BMI 49.98 kg/m2  LMP 10/14/2011 Constitutional: generally well-appearing Psychiatric: alert and oriented x3 Eyes: extraocular movements intact Mouth: oral pharynx moist, no lesions Neck: supple no lymphadenopathy Cardiovascular: heart regular rate and rhythm Lungs: clear to auscultation bilaterally Abdomen: soft, nontender, nondistended, no obvious ascites, no peritoneal signs, normal bowel sounds Extremities: no lower extremity edema bilaterally Skin: no lesions on visible extremities    Assessment and plan: 36 y.o. female with  morbid obesity, intermittent dysphagia, epigastric pain  I like to proceed with EGD at her soonest convenience. Her BMI is very high think it is safest to do at [facility] with anesthesia assistance.

## 2012-08-09 ENCOUNTER — Encounter (HOSPITAL_BASED_OUTPATIENT_CLINIC_OR_DEPARTMENT_OTHER): Admission: RE | Payer: Self-pay | Source: Ambulatory Visit

## 2012-08-09 ENCOUNTER — Ambulatory Visit (HOSPITAL_BASED_OUTPATIENT_CLINIC_OR_DEPARTMENT_OTHER): Admission: RE | Admit: 2012-08-09 | Payer: Medicaid Other | Source: Ambulatory Visit | Admitting: General Surgery

## 2012-08-09 SURGERY — REMOVAL PORT-A-CATH
Anesthesia: General

## 2012-08-20 ENCOUNTER — Encounter (HOSPITAL_COMMUNITY): Payer: Self-pay | Admitting: *Deleted

## 2012-08-20 ENCOUNTER — Encounter (HOSPITAL_COMMUNITY): Payer: Self-pay | Admitting: Pharmacy Technician

## 2012-08-21 ENCOUNTER — Encounter (HOSPITAL_COMMUNITY): Payer: Self-pay | Admitting: *Deleted

## 2012-08-21 NOTE — Pre-Procedure Instructions (Signed)
Your procedure is scheduled [id number], September 06, 2012 Report to [facility] Admitting [id number] Call this number if you have problems morning of your [phone]  Follow all bowel prep instructions per your doctor's orders.  Do not eat or drink anything after midnight the night before your procedure. You may brush your teeth, rinse out your mouth, but no water, no food, no chewing gum, no mints, no candies, no chewing tobacco.     Take these medicines the morning of your procedure with A SIP OF WATER:Protonix   Please make arrangements for a responsible person to drive you home after the procedure. You cannot go home by cab/taxi. We recommend you have someone with you at home the first 24 hours after your procedure. Driver for procedure is spouse [name]  LEAVE ALL VALUABLES, JEWELRY, BILLFOLD AT HOME.  NO DENTURES, CONTACT LENSES ALLOWED IN THE ENDOSCOPY ROOM.   YOU MAY WEAR DEODORANT, PLEASE REMOVE ALL JEWELRY, WATCHES RINGS, BODY PIERCINGS AND LEAVE AT HOME.   WOMEN: NO MAKE-UP, LOTIONS PERFUMES

## 2012-08-28 ENCOUNTER — Encounter (INDEPENDENT_AMBULATORY_CARE_PROVIDER_SITE_OTHER): Payer: Medicaid Other | Admitting: General Surgery

## 2012-08-29 ENCOUNTER — Encounter (INDEPENDENT_AMBULATORY_CARE_PROVIDER_SITE_OTHER): Payer: Medicaid Other | Admitting: Surgery

## 2012-09-03 ENCOUNTER — Ambulatory Visit: Payer: Medicaid Other | Admitting: Radiation Oncology

## 2012-09-06 ENCOUNTER — Ambulatory Visit (HOSPITAL_COMMUNITY): Payer: Medicaid Other | Admitting: Anesthesiology

## 2012-09-06 ENCOUNTER — Ambulatory Visit
Admission: RE | Admit: 2012-09-06 | Discharge: 2012-09-06 | Disposition: A | Discharge status: Home / Self Care | Payer: Medicaid Other | Source: Ambulatory Visit | Attending: Radiation Oncology | Admitting: Radiation Oncology

## 2012-09-06 ENCOUNTER — Encounter (HOSPITAL_BASED_OUTPATIENT_CLINIC_OR_DEPARTMENT_OTHER): Payer: Self-pay | Admitting: *Deleted

## 2012-09-06 ENCOUNTER — Encounter (HOSPITAL_COMMUNITY): Payer: Self-pay | Admitting: Gastroenterology

## 2012-09-06 ENCOUNTER — Encounter (HOSPITAL_COMMUNITY)
Admission: RE | Disposition: A | Discharge status: Home / Self Care | Payer: Self-pay | Source: Ambulatory Visit | Attending: Gastroenterology

## 2012-09-06 ENCOUNTER — Encounter (HOSPITAL_COMMUNITY): Payer: Self-pay | Admitting: Anesthesiology

## 2012-09-06 ENCOUNTER — Ambulatory Visit (HOSPITAL_COMMUNITY)
Admission: RE | Admit: 2012-09-06 | Discharge: 2012-09-06 | Disposition: A | Discharge status: Home / Self Care | Payer: Medicaid Other | Source: Ambulatory Visit | Attending: Gastroenterology | Admitting: Gastroenterology

## 2012-09-06 ENCOUNTER — Encounter: Payer: Self-pay | Admitting: Radiation Oncology

## 2012-09-06 VITALS — BP 147/88 | HR 103 | Temp 98.4°F | Resp 16 | Wt 318.7 lb

## 2012-09-06 DIAGNOSIS — R1310 Dysphagia, unspecified: Secondary | ICD-10-CM

## 2012-09-06 DIAGNOSIS — C50919 Malignant neoplasm of unspecified site of unspecified female breast: Secondary | ICD-10-CM

## 2012-09-06 HISTORY — PX: ESOPHAGOGASTRODUODENOSCOPY (EGD) WITH PROPOFOL: SHX5813

## 2012-09-06 SURGERY — ESOPHAGOGASTRODUODENOSCOPY (EGD) WITH PROPOFOL
Anesthesia: Monitor Anesthesia Care

## 2012-09-06 MED ORDER — MIDAZOLAM HCL 5 MG/5ML IJ SOLN
INTRAMUSCULAR | Status: DC | PRN
  Administered 2012-09-06: 2 mg via INTRAVENOUS

## 2012-09-06 MED ORDER — LACTATED RINGERS IV SOLN
INTRAVENOUS | Status: DC | PRN
  Administered 2012-09-06: 13:00:00 via INTRAVENOUS

## 2012-09-06 MED ORDER — KETAMINE HCL 10 MG/ML IJ SOLN
INTRAMUSCULAR | Status: DC | PRN
  Administered 2012-09-06: 30 mg via INTRAVENOUS

## 2012-09-06 MED ORDER — PROPOFOL 10 MG/ML IV EMUL
INTRAVENOUS | Status: DC | PRN
  Administered 2012-09-06: 100 ug/kg/min via INTRAVENOUS

## 2012-09-06 MED ORDER — BUTAMBEN-TETRACAINE-BENZOCAINE 2-2-14 % EX AERO
INHALATION_SPRAY | CUTANEOUS | Status: DC | PRN
  Administered 2012-09-06: 2 via TOPICAL

## 2012-09-06 SURGICAL SUPPLY — 15 items

## 2012-09-06 NOTE — Transfer of Care (Signed)
Immediate Anesthesia Transfer of Care Note  Patient: [name]  Procedure(s) Performed: Procedure(s) (LRB) with comments: ESOPHAGOGASTRODUODENOSCOPY (EGD) WITH PROPOFOL (N/A)  Patient Location: PACU  Anesthesia Type:MAC  Level of Consciousness: awake, alert , oriented and patient cooperative  Airway & Oxygen Therapy: Patient Spontanous Breathing and Patient connected to nasal cannula oxygen  Post-op Assessment: Report given to PACU RN and Post -op Vital signs reviewed and stable  Post vital signs: Reviewed and stable  Complications: No apparent anesthesia complications

## 2012-09-06 NOTE — Progress Notes (Signed)
Radiation Oncology         [phone] ________________________________  Name: [name] MRN: [id number]  Date: 09/06/2012  DOB: June 23, 1976  Follow-Up Visit Note  CC: Tomma Lightning, MD  Magrinat, Valentino Hue, MD  Diagnosis:  Locally advanced right breast cancer   Interval Since Last Radiation:  7 months  Narrative:  The patient returns today for routine follow-up.  She seems to be doing reasonably well. She is on  anastrozole and Zoladex.   She has noticed occasional problems with itching in the mastectomy scar area. I recommend she use hydrocortisone cream for this issue. she denies any swelling in her right arm or hand or numbness or tingling. She has noticed some firmness in the skin of the right lower chest region.                  ALLERGIES:  is allergic to oxycodone and tape.  Meds: Current Outpatient Prescriptions  Medication Sig Dispense Refill  . anastrozole (ARIMIDEX) 1 MG tablet Take 1 tablet (1 mg total) by mouth daily.  30 tablet  5  . B Complex-C (SUPER B COMPLEX PO) Take 1 tablet by mouth daily.      . benazepril-hydrochlorthiazide (LOTENSIN HCT) 20-12.5 MG per tablet Take 1 tablet by mouth 2 (two) times daily.      . ferrous sulfate (FERRO-BOB) 325 (65 FE) MG tablet Take 325 mg by mouth daily with breakfast.      . goserelin (ZOLADEX) 10.8 MG injection Inject 10.8 mg into the skin every 3 (three) months.      . naproxen sodium (ANAPROX) 220 MG tablet Take 220 mg by mouth 2 (two) times daily with a meal.      . pantoprazole (PROTONIX) 40 MG tablet TAKE ONE TABLET BY MOUTH EVERY DAY  30 tablet  0  . potassium chloride SA (K-DUR,KLOR-CON) 20 MEQ tablet Take 20 mEq by mouth daily.      . ranitidine (ZANTAC) 75 MG tablet Take 75 mg by mouth as needed. ONLY IF OUT OF PROTONIX        Physical Findings: The patient is in no acute distress. Patient is alert and oriented.  weight is 318 lb 11.2 oz (144.561 kg). Her oral temperature is 98.4 F (36.9 C). Her blood pressure  is 147/88 and her pulse is 103. Her respiration is 16 and oxygen saturation is 100%. .  No palpable cervical supraclavicular or axillary adenopathy. the lungs are clear to auscultation. The heart has a regular rhythm and rate.  The left breast is large and pendulous without mass or nipple discharge. Examination of the right chest wall area reveals hyperpigmentation changes. There is induration along the mastectomy scar but no palpable or visible signs recurrence.  Along the edge of her radiation fields in the right upper abdomen,  lower chest area there is an area of induration approximately 2-1/2-3 cm in the subcutaneous tissues. This is likely scar tissue. It is mildly painful with palpation.  Lab Findings: Lab Results  Component Value Date   WBC 6.4 06/29/2012   HGB 11.5* 06/29/2012   HCT 35.6 06/29/2012   MCV 79.9 06/29/2012   PLT 339 06/29/2012    @LASTCHEM @  Radiographic Findings: No results found.  Impression:  The patient is recovering from the effects of radiation.  No evidence of recurrence on exam today. In light of the induration along the the subcutaneous tissues,  the patient will return for one more followup in 3 months. She continues  followup with surgery and medical oncology.  _____________________________________  -----------------------------------  [name], PhD, MD

## 2012-09-06 NOTE — Progress Notes (Signed)
Patient presents to the clinic today unaccompanied for a follow up appointment with Dr. Roselind Messier. Patient alert and oriented to person, place, and time. No distress noted. Steady gait noted. Pleasant affect noted. Patient denies pain at this time. However, patient reports discomfort when she palpates under her right axilla. Patient reports that skin of her right breast has returned to normal color and appearance. Patient denies nipple discharge or bleeding. Patient reports normal energy level. Patient reports that she is dieting and exercising currently. Patient reports taking arimidex daily and zoladex injection every three months. Reported all findings to Dr. Roselind Messier.   1/30 labs  2/4 toth follow up 2/6 magrinat follow up

## 2012-09-06 NOTE — Interval H&P Note (Signed)
History and Physical Interval Note:  09/06/2012 12:19 PM  [name]  has presented today for surgery, with the diagnosis of Dysphagia [787.20] Epigastric pain [789.06]  The various methods of treatment have been discussed with the patient and family. After consideration of risks, benefits and other options for treatment, the patient has consented to  Procedure(s) (LRB) with comments: ESOPHAGOGASTRODUODENOSCOPY (EGD) WITH PROPOFOL (N/A) as a surgical intervention .  The patient's history has been reviewed, patient examined, no change in status, stable for surgery.  I have reviewed the patient's chart and labs.  Questions were answered to the patient's satisfaction.     Rob Bunting

## 2012-09-06 NOTE — H&P (View-Only) (Signed)
Radiation Oncology         [phone] ________________________________  Name: [name] MRN: [id number]  Date: 09/06/2012  DOB: 11/23/1975  Follow-Up Visit Note  CC: KATES, CHARITY, MD  Magrinat, Gustav C, MD  Diagnosis:  Locally advanced right breast cancer   Interval Since Last Radiation:  7 months  Narrative:  The patient returns today for routine follow-up.  She seems to be doing reasonably well. She is on  anastrozole and Zoladex.   She has noticed occasional problems with itching in the mastectomy scar area. I recommend she use hydrocortisone cream for this issue. she denies any swelling in her right arm or hand or numbness or tingling. She has noticed some firmness in the skin of the right lower chest region.                  ALLERGIES:  is allergic to oxycodone and tape.  Meds: Current Outpatient Prescriptions  Medication Sig Dispense Refill  . anastrozole (ARIMIDEX) 1 MG tablet Take 1 tablet (1 mg total) by mouth daily.  30 tablet  5  . B Complex-C (SUPER B COMPLEX PO) Take 1 tablet by mouth daily.      . benazepril-hydrochlorthiazide (LOTENSIN HCT) 20-12.5 MG per tablet Take 1 tablet by mouth 2 (two) times daily.      . ferrous sulfate (FERRO-BOB) 325 (65 FE) MG tablet Take 325 mg by mouth daily with breakfast.      . goserelin (ZOLADEX) 10.8 MG injection Inject 10.8 mg into the skin every 3 (three) months.      . naproxen sodium (ANAPROX) 220 MG tablet Take 220 mg by mouth 2 (two) times daily with a meal.      . pantoprazole (PROTONIX) 40 MG tablet TAKE ONE TABLET BY MOUTH EVERY DAY  30 tablet  0  . potassium chloride SA (K-DUR,KLOR-CON) 20 MEQ tablet Take 20 mEq by mouth daily.      . ranitidine (ZANTAC) 75 MG tablet Take 75 mg by mouth as needed. ONLY IF OUT OF PROTONIX        Physical Findings: The patient is in no acute distress. Patient is alert and oriented.  weight is 318 lb 11.2 oz (144.561 kg). Her oral temperature is 98.4 F (36.9 C). Her blood pressure  is 147/88 and her pulse is 103. Her respiration is 16 and oxygen saturation is 100%. .  No palpable cervical supraclavicular or axillary adenopathy. the lungs are clear to auscultation. The heart has a regular rhythm and rate.  The left breast is large and pendulous without mass or nipple discharge. Examination of the right chest wall area reveals hyperpigmentation changes. There is induration along the mastectomy scar but no palpable or visible signs recurrence.  Along the edge of her radiation fields in the right upper abdomen,  lower chest area there is an area of induration approximately 2-1/2-3 cm in the subcutaneous tissues. This is likely scar tissue. It is mildly painful with palpation.  Lab Findings: Lab Results  Component Value Date   WBC 6.4 06/29/2012   HGB 11.5* 06/29/2012   HCT 35.6 06/29/2012   MCV 79.9 06/29/2012   PLT 339 06/29/2012    @LASTCHEM@  Radiographic Findings: No results found.  Impression:  The patient is recovering from the effects of radiation.  No evidence of recurrence on exam today. In light of the induration along the the subcutaneous tissues,  the patient will return for one more followup in 3 months. She continues   followup with surgery and medical oncology.  _____________________________________  -----------------------------------  Khy Pitre D. Amorette Charrette, PhD, MD     

## 2012-09-06 NOTE — Op Note (Signed)
[facility] [address] [place], [address]   ENDOSCOPY PROCEDURE REPORT  PATIENT: Annette, Yoder  MR#: [id number] BIRTHDATE: March 07, 1976 , 36  yrs. old GENDER: Female ENDOSCOPIST: Rachael Fee, MD REFERRING:  Catalina Pizza, MD PROCEDURE DATE:  09/06/2012 PROCEDURE:  EGD, diagnostic ASA CLASS:     Class III INDICATIONS:  Dysphagia. MEDICATIONS: MAC sedation, administered by CRNA TOPICAL ANESTHETIC: Cetacaine Spray  DESCRIPTION OF PROCEDURE: After the risks benefits and alternatives of the procedure were thoroughly explained, informed consent was obtained.  The Pentax Gastroscope [id number] endoscope was introduced through the mouth and advanced to the second portion of the duodenum. Without limitations.  The instrument was slowly withdrawn as the mucosa was fully examined.     The upper, middle and distal third of the esophagus were carefully inspected and no abnormalities were noted.  The z-line was well seen at the GEJ.  The endoscope was pushed into the fundus which was normal including a retroflexed view.  The antrum, gastric body, first and second part of the duodenum were unremarkable. Retroflexed views revealed no abnormalities.     The scope was then withdrawn from the patient and the procedure completed. COMPLICATIONS: There were no complications.  ENDOSCOPIC IMPRESSION: Normal EGD  RECOMMENDATIONS: Continue once daily PPI, your swallowing symptom is possibly acid related.  It is best to take it 20-30 min prior to breakfast meal daily.  Also please add bedtime H2 blocker (such as OTC pepcid or zantac).  You should chew your food well, eat slowly and take small bites.  Losing weigh may help your GI symptoms as well.  Please call my office in 4-5 weeks to report on your progress.   eSigned:  Rachael Fee, MD 09/06/2012 1:16 PM

## 2012-09-06 NOTE — Anesthesia Postprocedure Evaluation (Signed)
  Anesthesia Post-op Note  Patient: [name]  Procedure(s) Performed: Procedure(s) (LRB): ESOPHAGOGASTRODUODENOSCOPY (EGD) WITH PROPOFOL (N/A)  Patient Location: PACU  Anesthesia Type: MAC  Level of Consciousness: awake and alert   Airway and Oxygen Therapy: Patient Spontanous Breathing  Post-op Pain: mild  Post-op Assessment: Post-op Vital signs reviewed, Patient's Cardiovascular Status Stable, Respiratory Function Stable, Patent Airway and No signs of Nausea or vomiting  Last Vitals:  Filed Vitals:   09/06/12 1232  BP: 122/93  Pulse: 84  Resp: 14    Post-op Vital Signs: stable   Complications: No apparent anesthesia complications

## 2012-09-06 NOTE — Preoperative (Signed)
Beta Blockers   Reason not to administer Beta Blockers:Not Applicable 

## 2012-09-06 NOTE — Anesthesia Preprocedure Evaluation (Addendum)
Anesthesia Evaluation  Patient identified by MRN, date of birth, ID band Patient awake    Reviewed: Allergy & Precautions, H&P , NPO status , Patient's Chart, lab work & pertinent test results  Airway Mallampati: III TM Distance: <3 FB Neck ROM: Full    Dental No notable dental hx.    Pulmonary neg pulmonary ROS,  breath sounds clear to auscultation  + decreased breath sounds      Cardiovascular hypertension, DVT Rhythm:Regular Rate:Normal     Neuro/Psych negative neurological ROS  negative psych ROS   GI/Hepatic Neg liver ROS, GERD-  ,  Endo/Other  Morbid obesity  Renal/GU negative Renal ROS  negative genitourinary   Musculoskeletal negative musculoskeletal ROS (+)   Abdominal   Peds negative pediatric ROS (+)  Hematology negative hematology ROS (+)   Anesthesia Other Findings   Reproductive/Obstetrics negative OB ROS                           Anesthesia Physical Anesthesia Plan  ASA: III  Anesthesia Plan: MAC   Post-op Pain Management:    Induction: Intravenous  Airway Management Planned: Nasal Cannula  Additional Equipment:   Intra-op Plan:   Post-operative Plan:   Informed Consent: I have reviewed the patients History and Physical, chart, labs and discussed the procedure including the risks, benefits and alternatives for the proposed anesthesia with the patient or authorized representative who has indicated his/her understanding and acceptance.     Plan Discussed with: CRNA and Surgeon  Anesthesia Plan Comments:         Anesthesia Quick Evaluation

## 2012-09-07 ENCOUNTER — Encounter (HOSPITAL_COMMUNITY): Payer: Self-pay | Admitting: Gastroenterology

## 2012-09-07 DIAGNOSIS — R059 Cough, unspecified: Secondary | ICD-10-CM

## 2012-09-07 HISTORY — DX: Cough, unspecified: R05.9

## 2012-09-07 NOTE — Pre-Procedure Instructions (Signed)
To come for BMET and EKG 

## 2012-09-10 ENCOUNTER — Other Ambulatory Visit: Payer: Self-pay | Admitting: Physician Assistant

## 2012-09-12 ENCOUNTER — Encounter (HOSPITAL_BASED_OUTPATIENT_CLINIC_OR_DEPARTMENT_OTHER): Payer: Self-pay | Admitting: *Deleted

## 2012-09-12 ENCOUNTER — Encounter (HOSPITAL_BASED_OUTPATIENT_CLINIC_OR_DEPARTMENT_OTHER): Payer: Self-pay | Admitting: Anesthesiology

## 2012-09-12 ENCOUNTER — Ambulatory Visit (HOSPITAL_BASED_OUTPATIENT_CLINIC_OR_DEPARTMENT_OTHER)
Admission: RE | Admit: 2012-09-12 | Discharge: 2012-09-12 | Disposition: A | Discharge status: Home / Self Care | Payer: Medicaid Other | Source: Ambulatory Visit | Attending: General Surgery | Admitting: General Surgery

## 2012-09-12 ENCOUNTER — Encounter (HOSPITAL_BASED_OUTPATIENT_CLINIC_OR_DEPARTMENT_OTHER)
Admission: RE | Disposition: A | Discharge status: Home / Self Care | Payer: Self-pay | Source: Ambulatory Visit | Attending: General Surgery

## 2012-09-12 ENCOUNTER — Ambulatory Visit (HOSPITAL_BASED_OUTPATIENT_CLINIC_OR_DEPARTMENT_OTHER): Payer: Medicaid Other | Admitting: Anesthesiology

## 2012-09-12 DIAGNOSIS — Z9010 Acquired absence of unspecified breast and nipple: Secondary | ICD-10-CM | POA: Insufficient documentation

## 2012-09-12 DIAGNOSIS — I10 Essential (primary) hypertension: Secondary | ICD-10-CM | POA: Insufficient documentation

## 2012-09-12 DIAGNOSIS — Z9221 Personal history of antineoplastic chemotherapy: Secondary | ICD-10-CM | POA: Insufficient documentation

## 2012-09-12 DIAGNOSIS — Z452 Encounter for adjustment and management of vascular access device: Secondary | ICD-10-CM

## 2012-09-12 DIAGNOSIS — Z923 Personal history of irradiation: Secondary | ICD-10-CM | POA: Insufficient documentation

## 2012-09-12 DIAGNOSIS — C50919 Malignant neoplasm of unspecified site of unspecified female breast: Secondary | ICD-10-CM | POA: Insufficient documentation

## 2012-09-12 DIAGNOSIS — K219 Gastro-esophageal reflux disease without esophagitis: Secondary | ICD-10-CM | POA: Insufficient documentation

## 2012-09-12 HISTORY — PX: PORT-A-CATH REMOVAL: SHX5289

## 2012-09-12 HISTORY — DX: Cough: R05

## 2012-09-12 HISTORY — DX: Gastric ulcer, unspecified as acute or chronic, without hemorrhage or perforation: K25.9

## 2012-09-12 SURGERY — REMOVAL PORT-A-CATH
Anesthesia: General | Site: Chest | Laterality: Left | Wound class: Clean

## 2012-09-12 MED ORDER — CHLORHEXIDINE GLUCONATE 4 % EX LIQD: 1.0000 "application " | Freq: Once | CUTANEOUS | Status: DC

## 2012-09-12 MED ORDER — FENTANYL CITRATE 0.05 MG/ML IJ SOLN
25.0000 ug | INTRAMUSCULAR | Status: DC | PRN
  Administered 2012-09-12: 25 ug via INTRAVENOUS

## 2012-09-12 MED ORDER — MEPERIDINE HCL 25 MG/ML IJ SOLN: 6.2500 mg | INTRAMUSCULAR | Status: DC | PRN

## 2012-09-12 MED ORDER — OXYCODONE HCL 5 MG PO TABS: 5.0000 mg | ORAL_TABLET | Freq: Once | ORAL | Status: DC | PRN

## 2012-09-12 MED ORDER — MIDAZOLAM HCL 2 MG/ML PO SYRP: 12.0000 mg | ORAL_SOLUTION | Freq: Once | ORAL | Status: DC | PRN

## 2012-09-12 MED ORDER — LIDOCAINE HCL (CARDIAC) 20 MG/ML IV SOLN
INTRAVENOUS | Status: DC | PRN
  Administered 2012-09-12: 75 mg via INTRAVENOUS

## 2012-09-12 MED ORDER — FENTANYL CITRATE 0.05 MG/ML IJ SOLN: 50.0000 ug | INTRAMUSCULAR | Status: DC | PRN

## 2012-09-12 MED ORDER — HYDROCODONE-ACETAMINOPHEN 5-325 MG PO TABS: 1.0000 | ORAL_TABLET | Freq: Four times a day (QID) | ORAL | Status: DC | PRN

## 2012-09-12 MED ORDER — ONDANSETRON HCL 4 MG/2ML IJ SOLN: 4.0000 mg | Freq: Once | INTRAMUSCULAR | Status: DC | PRN

## 2012-09-12 MED ORDER — MIDAZOLAM HCL 2 MG/2ML IJ SOLN: 1.0000 mg | INTRAMUSCULAR | Status: DC | PRN

## 2012-09-12 MED ORDER — MIDAZOLAM HCL 5 MG/5ML IJ SOLN
INTRAMUSCULAR | Status: DC | PRN
  Administered 2012-09-12: 1 mg via INTRAVENOUS

## 2012-09-12 MED ORDER — OXYCODONE HCL 5 MG/5ML PO SOLN: 5.0000 mg | Freq: Once | ORAL | Status: DC | PRN

## 2012-09-12 MED ORDER — PROMETHAZINE HCL 25 MG/ML IJ SOLN: 6.2500 mg | INTRAMUSCULAR | Status: DC | PRN

## 2012-09-12 MED ORDER — FENTANYL CITRATE 0.05 MG/ML IJ SOLN
INTRAMUSCULAR | Status: DC | PRN
  Administered 2012-09-12: 50 ug via INTRAVENOUS

## 2012-09-12 MED ORDER — LIDOCAINE HCL (PF) 1 % IJ SOLN
INTRAMUSCULAR | Status: DC | PRN
  Administered 2012-09-12: 5 mL

## 2012-09-12 MED ORDER — PROPOFOL 10 MG/ML IV EMUL
INTRAVENOUS | Status: DC | PRN
  Administered 2012-09-12: 25 ug/kg/min via INTRAVENOUS

## 2012-09-12 MED ORDER — LACTATED RINGERS IV SOLN
INTRAVENOUS | Status: DC
  Administered 2012-09-12: 12:00:00 via INTRAVENOUS

## 2012-09-12 MED ORDER — BUPIVACAINE HCL (PF) 0.25 % IJ SOLN
INTRAMUSCULAR | Status: DC | PRN
  Administered 2012-09-12: 5 mL

## 2012-09-12 MED ORDER — HYDROMORPHONE HCL PF 1 MG/ML IJ SOLN: 0.2500 mg | INTRAMUSCULAR | Status: DC | PRN

## 2012-09-12 SURGICAL SUPPLY — 31 items
ADH SKN CLS APL DERMABOND .7 (GAUZE/BANDAGES/DRESSINGS) ×1
BLADE SURG 15 STRL LF DISP TIS (BLADE) ×1 IMPLANT
BLADE SURG 15 STRL SS (BLADE) ×2
CHLORAPREP W/TINT 26ML (MISCELLANEOUS) ×3 IMPLANT
CLOTH BEACON ORANGE TIMEOUT ST (SAFETY) ×2 IMPLANT
COVER MAYO STAND STRL (DRAPES) ×2 IMPLANT
COVER TABLE BACK 60X90 (DRAPES) ×2 IMPLANT
DECANTER SPIKE VIAL GLASS SM (MISCELLANEOUS) ×1 IMPLANT
DERMABOND ADVANCED (GAUZE/BANDAGES/DRESSINGS) ×1
DERMABOND ADVANCED .7 DNX12 (GAUZE/BANDAGES/DRESSINGS) ×1 IMPLANT
DRAPE PED LAPAROTOMY (DRAPES) ×2 IMPLANT
DRAPE UTILITY XL STRL (DRAPES) ×2 IMPLANT
ELECT COATED BLADE 2.86 ST (ELECTRODE) IMPLANT
ELECT REM PT RETURN 9FT ADLT (ELECTROSURGICAL)
ELECTRODE REM PT RTRN 9FT ADLT (ELECTROSURGICAL) IMPLANT
GLOVE BIO SURGEON STRL SZ7.5 (GLOVE) ×2 IMPLANT
GLOVE SKINSENSE NS SZ7.0 (GLOVE) ×1
GLOVE SKINSENSE STRL SZ7.0 (GLOVE) IMPLANT
GOWN PREVENTION PLUS XLARGE (GOWN DISPOSABLE) ×3 IMPLANT
NDL HYPO 25X1 1.5 SAFETY (NEEDLE) ×1 IMPLANT
NEEDLE HYPO 25X1 1.5 SAFETY (NEEDLE) ×2 IMPLANT
PACK BASIN DAY SURGERY FS (CUSTOM PROCEDURE TRAY) ×2 IMPLANT
PENCIL BUTTON HOLSTER BLD 10FT (ELECTRODE) IMPLANT
SLEEVE SCD COMPRESS KNEE MED (MISCELLANEOUS) IMPLANT
SUT MON AB 4-0 PC3 18 (SUTURE) ×2 IMPLANT
SUT VIC AB 3-0 SH 27 (SUTURE) ×2
SUT VIC AB 3-0 SH 27X BRD (SUTURE) ×1 IMPLANT
SYR CONTROL 10ML LL (SYRINGE) ×2 IMPLANT
TOWEL OR 17X24 6PK STRL BLUE (TOWEL DISPOSABLE) ×2 IMPLANT
TOWEL OR NON WOVEN STRL DISP B (DISPOSABLE) ×2 IMPLANT
WATER STERILE IRR 1000ML POUR (IV SOLUTION) ×2 IMPLANT

## 2012-09-12 NOTE — Anesthesia Procedure Notes (Signed)
Procedure Name: MAC Date/Time: 09/12/2012 12:15 PM Performed by: Zenia Resides D Pre-anesthesia Checklist: Patient identified, Emergency Drugs available, Suction available and Patient being monitored Patient Re-evaluated:Patient Re-evaluated prior to inductionOxygen Delivery Method: Simple face mask Placement Confirmation: positive ETCO2 and CO2 detector

## 2012-09-12 NOTE — Anesthesia Postprocedure Evaluation (Signed)
Anesthesia Post Note  Patient: [name]  Procedure(s) Performed: Procedure(s) (LRB): REMOVAL PORT-A-CATH (Left)  Anesthesia type: general  Patient location: PACU  Post pain: Pain level controlled  Post assessment: Patient's Cardiovascular Status Stable  Last Vitals:  Filed Vitals:   09/12/12 1300  BP: 165/96  Pulse: 79  Temp:   Resp: 11    Post vital signs: Reviewed and stable  Level of consciousness: sedated  Complications: No apparent anesthesia complications

## 2012-09-12 NOTE — Anesthesia Preprocedure Evaluation (Addendum)
Anesthesia Evaluation  Patient identified by MRN, date of birth, ID band  Reviewed: Allergy & Precautions, H&P , NPO status , Patient's Chart, lab work & pertinent test results  Airway Mallampati: II TM Distance: >3 FB Neck ROM: Full    Dental No notable dental hx.    Pulmonary neg pulmonary ROS,    Pulmonary exam normal       Cardiovascular hypertension, On Medications negative cardio ROS      Neuro/Psych  Headaches, PSYCHIATRIC DISORDERS  Neuromuscular disease    GI/Hepatic Neg liver ROS, GERD-  Medicated and Controlled,  Endo/Other  Morbid obesity  Renal/GU negative Renal ROS  negative genitourinary   Musculoskeletal   Abdominal   Peds  Hematology negative hematology ROS (+)   Anesthesia Other Findings   Reproductive/Obstetrics negative OB ROS                          Anesthesia Physical Anesthesia Plan  ASA: III  Anesthesia Plan: General and MAC   Post-op Pain Management:    Induction: Intravenous  Airway Management Planned: Natural Airway  Additional Equipment:   Intra-op Plan:   Post-operative Plan: Extubation in OR  Informed Consent: I have reviewed the patients History and Physical, chart, labs and discussed the procedure including the risks, benefits and alternatives for the proposed anesthesia with the patient or authorized representative who has indicated his/her understanding and acceptance.   Dental advisory given  Plan Discussed with: CRNA and Surgeon  Anesthesia Plan Comments:        Anesthesia Quick Evaluation

## 2012-09-12 NOTE — H&P (Signed)
[name]  Description:  37 year old female  07/30/2012 10:40 AM Office Visit Provider:  Robyne Askew, MD  MRN: [id number] Department:  [facility]            Diagnoses  Reason for Visit    Breast cancer - Primary  Follow-up   174.9  reck br           Vitals - Last Recorded       BP  Pulse  Temp  Ht  Wt  BMI    134/82  97  96.7 F (35.9 C) (Temporal)  5\' 7"  (1.702 m)  317 lb 6.4 oz (143.972 kg)  49.71 kg/m2          SpO2               98%                   Progress Notes     Robyne Askew, MD 07/30/2012 11:29 AM Signed    Subjective:    Patient ID: [name], female DOB: [birth year], 37 y.o. MRN: [id number]  HPI  The patient is a 37 year old black female who is 10 months status post right mastectomy and sentinel node biopsy for a T2 N1 MIC right breast cancer. She has finished chemoradiation therapy and is now taking arimidex. She is tolerating this well. She has no complaints today. She states that she would like to have her port removed  Review of Systems  Constitutional: Negative.  HENT: Negative.  Eyes: Negative.  Respiratory: Negative.  Cardiovascular: Negative.  Gastrointestinal: Negative.  Genitourinary: Negative.  Musculoskeletal: Negative.  Skin: Negative.  Neurological: Negative.  Hematological: Negative.  Psychiatric/Behavioral: Negative.      Objective:    Physical Exam  Constitutional: She is oriented to person, place, and time. She appears well-developed and well-nourished.  HENT:  Head: Normocephalic and atraumatic.  Eyes: Conjunctivae normal and EOM are normal. Pupils are equal, round, and reactive to light.  Neck: Normal range of motion. Neck supple.  Cardiovascular: Normal rate, regular rhythm and normal heart sounds.  Pulmonary/Chest: Effort normal and breath sounds normal.  Abdominal: Soft. Bowel sounds are normal. She exhibits no mass. There is no tenderness.  Musculoskeletal: Normal range of  motion.  Lymphadenopathy:  She has no cervical adenopathy.  Neurological: She is alert and oriented to person, place, and time.  Skin: Skin is warm and dry.  Psychiatric: She has a normal mood and affect. Her behavior is normal.      Assessment:     10 months status post right mastectomy for breast cancer     Plan:     At this point she will continue to take arimidex. I discussed with her in detail the risks and benefits of the operation to remove the port as well as some of the technical aspects and she understands and wishes to proceed. We will plan for port removal in the near future. Otherwise we'll see her back in about 3 months              Not recorded               Pending        Disp  Refills  Start  End    chlorhexidine (HIBICLENS) 4 % liquid 1 application    07/30/2012  07/30/2012    Route: Topical    Class: Normal  chlorhexidine (HIBICLENS) 4 % liquid 1 application    07/31/2012  07/31/2012    Route: Topical    Class: Normal                    Patient Instructions     Plan to remove port          Level of Service  Follow-up and Disposition    PR OFFICE OUTPATIENT VISIT 15 MINUTES [99213]  Return in about 3 months (around 10/28/2012).           All Flowsheet Templates (all recorded)     Encounter Vitals Flowsheet   Custom Formula Data Flowsheet   Anthropometrics Flowsheet                           Referring Provider          Tomma Lightning, MD            All Charges for This Encounter       Code  Description  Service Date  Service Provider  Modifiers  Quantity    [phone]  PR OFFICE OUTPATIENT VISIT 15 MINUTES  07/30/2012  Robyne Askew, MD   1                Other Encounter Related Information     Allergies & Medications      Problem List      History      Patient-Entered Questionnaires      Printed AVS Reports       Printed at    Printed by    07/30/2012 11:03 AM  After Visit  Summary  Robyne Askew, MD           No data filed

## 2012-09-12 NOTE — Transfer of Care (Signed)
Immediate Anesthesia Transfer of Care Note  Patient: [name]  Procedure(s) Performed: Procedure(s) (LRB) with comments: REMOVAL PORT-A-CATH (Left)  Patient Location: PACU  Anesthesia Type:MAC  Level of Consciousness: awake, alert  and oriented  Airway & Oxygen Therapy: Patient Spontanous Breathing and Patient connected to nasal cannula oxygen  Post-op Assessment: Report given to PACU RN and Post -op Vital signs reviewed and stable  Post vital signs: Reviewed and stable  Complications: No apparent anesthesia complications

## 2012-09-12 NOTE — Op Note (Signed)
09/12/2012  12:49 PM  PATIENT:  [name]  37 y.o. female  PRE-OPERATIVE DIAGNOSIS:  un-needed port-a-cath  POST-OPERATIVE DIAGNOSIS:  un-needed port-a-cath-Left Chest  PROCEDURE:  Procedure(s) (LRB) with comments: REMOVAL PORT-A-CATH (Left)  SURGEON:  Surgeon(s) and Role:    * Robyne Askew, MD - Primary  PHYSICIAN ASSISTANT:   ASSISTANTS: none   ANESTHESIA:   local and IV sedation  EBL:  Total I/O In: 300 [I.V.:300] Out: -   BLOOD ADMINISTERED:none  DRAINS: none   LOCAL MEDICATIONS USED:  MARCAINE    and LIDOCAINE   SPECIMEN:  No Specimen  DISPOSITION OF SPECIMEN:  N/A  COUNTS:  YES  TOURNIQUET:  * No tourniquets in log *  DICTATION: .Dragon Dictation After informed consent was obtained the patient was brought to the operating room and placed in the supine position on the operating room table. After adequate IV sedation had been given the patient's left chest was prepped with ChloraPrep, allowed to dry, and draped in usual sterile manner. The area around the port was infiltrated with 1% lidocaine and quarter percent Marcaine until a good field block was created. A small incision was made through her old incision with a 15 blade knife. This incision was carried to the subcutaneous tissue sharply with Metzenbaum scissors until the port was identified. The capsule around the port was opened sharply with a 15 blade knife. The 2 anchoring stitches were identified, divided, and removed. The port was then gently pushed out of its pocket without difficulty and with gentle traction was removed completely from the patient. Pressure was held for several minutes until the area was hemostatic. The deep layer the wound was then closed with interrupted 3-0 Vicryl stitches. The skin was then closed with interrupted 4-0 Monocryl subcuticular stitches. Dermabond dressings were applied. The patient tolerated the procedure well. At the end of the case all needle sponge and instrument  counts were correct. The patient was then awakened and taken to recovery in stable condition.  PLAN OF CARE: Discharge to home after PACU  PATIENT DISPOSITION:  PACU - hemodynamically stable.   Delay start of Pharmacological VTE agent (>24hrs) due to surgical blood loss or risk of bleeding: not applicable

## 2012-09-12 NOTE — Interval H&P Note (Signed)
History and Physical Interval Note:  09/12/2012 10:36 AM  [name]  has presented today for surgery, with the diagnosis of un-needed port-a-cath  The various methods of treatment have been discussed with the patient and family. After consideration of risks, benefits and other options for treatment, the patient has consented to  Procedure(s) (LRB) with comments: REMOVAL PORT-A-CATH (N/A) as a surgical intervention .  The patient's history has been reviewed, patient examined, no change in status, stable for surgery.  I have reviewed the patient's chart and labs.  Questions were answered to the patient's satisfaction.     TOTH III,Ainslie Mazurek S

## 2012-09-13 ENCOUNTER — Encounter (HOSPITAL_BASED_OUTPATIENT_CLINIC_OR_DEPARTMENT_OTHER): Payer: Self-pay | Admitting: General Surgery

## 2012-09-13 ENCOUNTER — Encounter (INDEPENDENT_AMBULATORY_CARE_PROVIDER_SITE_OTHER): Payer: Medicaid Other | Admitting: General Surgery

## 2012-09-13 LAB — POCT I-STAT, CHEM 8
Creatinine, Ser: 0.8 mg/dL (ref 0.50–1.10)
Glucose, Bld: 119 mg/dL — ABNORMAL HIGH (ref 70–99)
Hemoglobin: 12.9 g/dL (ref 12.0–15.0)
Potassium: 3.5 mEq/L (ref 3.5–5.1)

## 2012-09-20 ENCOUNTER — Other Ambulatory Visit (HOSPITAL_BASED_OUTPATIENT_CLINIC_OR_DEPARTMENT_OTHER): Payer: Medicaid Other | Admitting: Lab

## 2012-09-20 DIAGNOSIS — C50419 Malignant neoplasm of upper-outer quadrant of unspecified female breast: Secondary | ICD-10-CM

## 2012-09-20 DIAGNOSIS — C50919 Malignant neoplasm of unspecified site of unspecified female breast: Secondary | ICD-10-CM

## 2012-09-20 LAB — COMPREHENSIVE METABOLIC PANEL (CC13)
AST: 17 U/L (ref 5–34)
BUN: 13.1 mg/dL (ref 7.0–26.0)
Chloride: 103 mEq/L (ref 98–107)
Creatinine: 0.8 mg/dL (ref 0.6–1.1)

## 2012-09-20 LAB — CBC WITH DIFFERENTIAL/PLATELET
BASO%: 0.3 % (ref 0.0–2.0)
EOS%: 7 % (ref 0.0–7.0)
LYMPH%: 14.5 % (ref 14.0–49.7)
MCHC: 32.7 g/dL (ref 31.5–36.0)
MCV: 78.9 fL — ABNORMAL LOW (ref 79.5–101.0)
MONO%: 8.3 % (ref 0.0–14.0)
NEUT#: 4.2 10*3/uL (ref 1.5–6.5)
Platelets: 350 10*3/uL (ref 145–400)
RBC: 4.36 10*6/uL (ref 3.70–5.45)
RDW: 14.2 % (ref 11.2–14.5)

## 2012-09-20 LAB — CANCER ANTIGEN 27.29: CA 27.29: 18 U/mL (ref 0–39)

## 2012-09-25 ENCOUNTER — Ambulatory Visit (INDEPENDENT_AMBULATORY_CARE_PROVIDER_SITE_OTHER): Payer: Medicaid Other | Admitting: General Surgery

## 2012-09-25 ENCOUNTER — Encounter (INDEPENDENT_AMBULATORY_CARE_PROVIDER_SITE_OTHER): Payer: Self-pay | Admitting: General Surgery

## 2012-09-25 VITALS — BP 122/76 | HR 89 | Temp 95.5°F | Resp 20 | Ht 67.0 in | Wt 325.0 lb

## 2012-09-25 DIAGNOSIS — C50919 Malignant neoplasm of unspecified site of unspecified female breast: Secondary | ICD-10-CM

## 2012-09-25 NOTE — Progress Notes (Signed)
Subjective:     Patient ID: [name], female   DOB: 1975-12-04, 37 y.o.   MRN: [id number]  HPI The patient is a 37 year old black female who is one year status post right mastectomy and sentinel node mapping for a T2 N1 MIC right breast cancer. She is taking a rim fracture and tolerated it well. She is a couple weeks status post removal of her Port-A-Cath. She tolerated that well. She only notes some stinging occasionally at the operative site.  Review of Systems     Objective:   Physical Exam On exam her Port-A-Cath site looks good with no sign of infection.    Assessment:     1 year status post right mastectomy for breast cancer in 2 weeks status post removal report    Plan:     At this point she can continue to do all her normal activities. She will continue to take arimidex. We will plan to see her back in about 3 months.

## 2012-09-27 ENCOUNTER — Ambulatory Visit (HOSPITAL_BASED_OUTPATIENT_CLINIC_OR_DEPARTMENT_OTHER): Payer: Medicaid Other | Admitting: Oncology

## 2012-09-27 ENCOUNTER — Telehealth: Payer: Self-pay | Admitting: Oncology

## 2012-09-27 VITALS — BP 131/89 | HR 105 | Temp 97.2°F | Resp 20 | Ht 67.0 in | Wt 318.9 lb

## 2012-09-27 DIAGNOSIS — C50419 Malignant neoplasm of upper-outer quadrant of unspecified female breast: Secondary | ICD-10-CM

## 2012-09-27 DIAGNOSIS — R51 Headache: Secondary | ICD-10-CM

## 2012-09-27 DIAGNOSIS — Z5111 Encounter for antineoplastic chemotherapy: Secondary | ICD-10-CM

## 2012-09-27 DIAGNOSIS — F329 Major depressive disorder, single episode, unspecified: Secondary | ICD-10-CM

## 2012-09-27 DIAGNOSIS — C50919 Malignant neoplasm of unspecified site of unspecified female breast: Secondary | ICD-10-CM

## 2012-09-27 MED ORDER — ANASTROZOLE 1 MG PO TABS: 1.0000 mg | ORAL_TABLET | Freq: Every day | ORAL | Status: DC

## 2012-09-27 MED ORDER — GOSERELIN ACETATE 10.8 MG ~~LOC~~ IMPL
10.8000 mg | DRUG_IMPLANT | SUBCUTANEOUS | Status: DC
  Administered 2012-09-27: 10.8 mg via SUBCUTANEOUS
  Filled 2012-09-27: qty 10.8

## 2012-09-27 NOTE — Progress Notes (Signed)
ID: [name]   DOB: 05-22-1976  MR#: [id number]  GNF#:[id number]  PCP: Tomma Lightning, MD GYN: SUFelicity Pellegrini OTHER MD:  HISTORY OF PRESENT ILLNESS: The patient developed pain in her right breast and brought it to her primary care physician's attention. Mammography at [facility] 03/10/2011 found the breasts to be heterogeneously dense. There was a mildly irregular mass posteriorly in the upper outer quadrant of the right breast. On ultrasound this was hypoechoic and measured approximately 2.4 cm, with irregular margins. The left breast was unremarkable.  On 03/14/2011 biopsy of the right breast mass showed ([id number]) an invasive ductal carcinoma, grade 3, 99% estrogen receptor and 100% progesterone receptor positive, with an MIB-1-1 of 98%, and no HER-2 amplification. Bilateral breast MRIs were obtained 03/18/2011, showing in the upper outer quadrant of the right breast an irregular mass measuring 5.5 cm. There was a suggestion of cortical thickening in the level I right axillary lymph node, which measured 1.2 cm. The patient was staged with chest CT scan and PET scan, which showed no distant disease. These studies did confirm the abnormality in the right breast as well as a hypermetabolic right axillary lymph node, making this a clinical T3 N1 or stage III invasive ductal carcinoma.  She completed neoadjuvant chemotherapy and proceeded to surgery and subsequent treatment as detailed below..  INTERVAL HISTORY: [name] returns today for followup of her breast cancer. She is still grieving from her mother's death 2012/03/06. Of course she is also dealing with postmenopausal problems including hot flashes and sleep disturbance.  REVIEW OF SYSTEMS: She is working on her weight, dieting some and doing regular exercise. She goes to bed about 11 but frequently doesn't fall asleep until 2 or 3 in the morning. She gets up anytime between 9/11 in the morning, and my one suggestion to her was that she always get up  at the same time and try to entrain her body to fall sleep also a regular time. She is having more frequent headaches, and they tend to be morning headaches sometimes persisting throughout the day. She has these perhaps 3-4 days a week. Possibly this could be due to eye problems related to her use of computers in her work. She has some low back pain which is not more intense or frequent than before. She admits to being somewhat depressed, but does not want to try antidepressants at this point. A detailed review of systems was otherwise noncontributory  PAST MEDICAL HISTORY: Past Medical History  Diagnosis Date  . Back pain     lumbar   . Anemia   . GERD (gastroesophageal reflux disease)   . Status post chemotherapy     finished 08/2011  . H/O scoliosis   . Peripheral neuropathy     s/p chemotherapy  . Hypertension     under control with med., has been on med. x 5 yr.  . Depression     no current med.  Marland Kitchen Headache     hx. migraine in childhood  . Breast cancer 2012    right  . Gastric ulcer   . Cough 09/07/2012    PAST SURGICAL HISTORY: Past Surgical History  Procedure Date  . Hip pinning age 8    bilateral  . Portacath placement 04/08/2011  . Mastectomy w/ sentinel node biopsy 10/14/2011    Procedure: MASTECTOMY WITH SENTINEL LYMPH NODE BIOPSY;  Surgeon: Robyne Askew, MD;  Location: [facility] OR;  Service: General;  Laterality: Right;  . Wound  exploration 10/14/2011    right mastectomy wound  . Esophagogastroduodenoscopy (egd) with propofol 09/06/2012    Procedure: ESOPHAGOGASTRODUODENOSCOPY (EGD) WITH PROPOFOL;  Surgeon: Rachael Fee, MD;  Location: [facility];  Service: Endoscopy;  Laterality: N/A;  . Port-a-cath removal 09/12/2012    Procedure: REMOVAL PORT-A-CATH;  Surgeon: Robyne Askew, MD;  Location: [facility];  Service: General;  Laterality: Left;    FAMILY HISTORY Family History  Problem Relation Age of Onset  . Heart disease Father     heart attack   . Heart attack Father     died age 67  . Hypertension Father   . Hypertension Mother   . Anemia Mother   . Diabetes Mother   . Hypertension Brother   . Diabetes Brother   . Heart disease Maternal Grandmother     MI  . Hypertension Maternal Grandmother   . Diabetes Maternal Grandmother   . Hypertension Paternal Grandmother   . Stroke Mother   . Aneurysm Mother     brain  . Diabetes Paternal Grandmother   [name] died from a stroke at age 20. The patient's father died from a myocardial infarction at the age of 32. She has one sister and one brother. There is no history of breast or ovarian cancer in the family.   GYNECOLOGIC HISTORY: Menarche age 66. She was premenopausal at the time of diagnosis. She has never used birth control pills. She is GX P2. First pregnancy to term age 48. She had her last period at the time of her surgery.  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 f Her husband, [name], works in Holiday representative. Daughters, [place], 17, and [place], [place], both live at home with the patient and her husband.    ADVANCED DIRECTIVES:  HEALTH MAINTENANCE: History  Substance Use Topics  . Smoking status: Former Games developer  . Smokeless tobacco: Never Used     Comment: quit 1998 - states only smoked for 1 month  . Alcohol Use: Yes     Comment: rarely     Colonoscopy:  PAP:  Bone density:  Lipid panel:  Allergies  Allergen Reactions  . Oxycodone Other (See Comments)    headache  . Tape Other (See Comments)    SKIN TEARS    Current Outpatient Prescriptions  Medication Sig Dispense Refill  . anastrozole (ARIMIDEX) 1 MG tablet Take 1 tablet (1 mg total) by mouth daily.  30 tablet  5  . B Complex-C (SUPER B COMPLEX PO) Take 1 tablet by mouth daily.      . benazepril-hydrochlorthiazide (LOTENSIN HCT) 20-12.5 MG per tablet Take 1 tablet by mouth 2 (two) times daily.      . ferrous sulfate (FERRO-BOB) 325 (65 FE) MG tablet Take 325 mg  by mouth daily with breakfast.      . goserelin (ZOLADEX) 10.8 MG injection Inject 10.8 mg into the skin every 3 (three) months.      Marland Kitchen KLOR-CON M20 20 MEQ tablet TAKE ONE TABLET BY MOUTH DAILY  30 tablet  2  . naproxen sodium (ANAPROX) 220 MG tablet Take 220 mg by mouth 2 (two) times daily with a meal.      . pantoprazole (PROTONIX) 40 MG tablet TAKE ONE TABLET BY MOUTH EVERY DAY  30 tablet  0  . ranitidine (ZANTAC) 75 MG tablet Take 75 mg by mouth as needed. ONLY IF OUT OF PROTONIX        OBJECTIVE:  Young [place] American woman who appears depressed Filed Vitals:   09/27/12 0914  BP: 131/89  Pulse: 105  Temp: 97.2 F (36.2 C)  Resp: 20     Body mass index is 49.95 kg/(m^2).    ECOG FS: 1  Filed Weights   09/27/12 0914  Weight: 318 lb 14.4 oz (144.652 kg)   Oropharynx clear  No cervical or supraclavicular adenopathy  Lungs no crackles or wheezes Heart regular rate and rhythm Abdomen obese, soft, nontender, positive bowel sounds Musculoskeletal exam shows no peripheral edema. There is mild scoliosis but no focal spinal tenderness  Neurologic exam is nonfocal, affect is appropriate The right breast is status post mastectomy and radiation. There is no evidence of local recurrence. The right axilla is benign. The left breast is unremarkable.    LAB RESULTS: Lab Results  Component Value Date   WBC 6.0 09/20/2012   NEUTROABS 4.2 09/20/2012   HGB 11.3* 09/20/2012   HCT 34.5* 09/20/2012   MCV 78.9* 09/20/2012   PLT 350 09/20/2012      Chemistry      Component Value Date/Time   NA 143 09/20/2012 1117   NA 142 09/12/2012 1154   K 3.6 09/20/2012 1117   K 3.5 09/12/2012 1154   CL 103 09/20/2012 1117   CL 99 09/12/2012 1154   CO2 29 09/20/2012 1117   CO2 30 04/04/2012 1456   BUN 13.1 09/20/2012 1117   BUN 11 09/12/2012 1154   CREATININE 0.8 09/20/2012 1117   CREATININE 0.80 09/12/2012 1154      Component Value Date/Time   CALCIUM 10.0 09/20/2012 1117   CALCIUM 9.8 04/04/2012 1456    ALKPHOS 132 09/20/2012 1117   ALKPHOS 113 04/04/2012 1456   AST 17 09/20/2012 1117   AST 26 04/04/2012 1456   ALT 17 09/20/2012 1117   ALT 24 04/04/2012 1456   BILITOT 0.45 09/20/2012 1117   BILITOT 0.4 04/04/2012 1456       Lab Results  Component Value Date   LABCA2 18 09/20/2012    STUDIES:  Left mammogram at [facility] on 03/12/2012 was unremarkable.    ASSESSMENT: 37 year old BRCA 1-2 negative [place] woman   (1) status post right breast biopsy July of 2012 for a clinical T3 N1 (stage III) invasive ductal carcinoma, grade 3, 99% estrogen and  100% progesterone receptor positive, HER-2 negative, with an MIB-1 of 98%.   (2) Status post 4 cycles of dose dense doxorubicin and cyclophosphamide, followed by weekly paclitaxel x 8, completed 08/25/2011  (3) s/p right mastectomy and sentinel lymph node sampling 10/14/2011 for a residual ypT2 ypN1(mic) invasive ductal carcinoma, grade 3, with ample margins  (4) status post radiation therapy under the care of Dr. Roselind Messier,  completed in mid June.  (5)  Anti-estrogen therapy following radiation, with first Zoladex injection given on 01/18/2012, and to be continued every 3 months. Patient has a history of DVT, and was started on anastrozole rather than tamoxifen, the anastrazole started in late June 2013.  PLAN:  Nikyla is doing well as far as her breast cancer is concerned. I think her frequent headaches are going to be related to visual problems relating to computer work but we are going to make 100% sure by obtaining a brain MRI. She will call for those results. She is going to continue to work on a dieting and exercise as far as her weight is concerned. I offered her grief counseling through hospice but she does not feel she needs this at  present. As far as her sleep disturbance she is going to try to wake up every morning at the same time and see if that will help her fall asleep at irregular time. Otherwise she will return in 3 months for her next  visit and next Zoladex shot. The plan is to continue anastrozole for a minimum of 5 years.  Tacuma Graffam C    09/27/2012

## 2012-10-10 ENCOUNTER — Ambulatory Visit (HOSPITAL_COMMUNITY)
Admission: RE | Admit: 2012-10-10 | Discharge: 2012-10-10 | Disposition: A | Discharge status: Home / Self Care | Payer: Medicaid Other | Source: Ambulatory Visit | Attending: Oncology | Admitting: Oncology

## 2012-10-10 DIAGNOSIS — C50919 Malignant neoplasm of unspecified site of unspecified female breast: Secondary | ICD-10-CM | POA: Insufficient documentation

## 2012-10-10 DIAGNOSIS — R51 Headache: Secondary | ICD-10-CM | POA: Insufficient documentation

## 2012-10-10 MED ORDER — GADOBENATE DIMEGLUMINE 529 MG/ML IV SOLN
20.0000 mL | Freq: Once | INTRAVENOUS | Status: AC | PRN
  Administered 2012-10-10: 20 mL via INTRAVENOUS

## 2012-12-06 ENCOUNTER — Ambulatory Visit: Payer: Medicaid Other | Admitting: Radiation Oncology

## 2012-12-10 ENCOUNTER — Encounter: Payer: Self-pay | Admitting: Lab

## 2012-12-11 DIAGNOSIS — R739 Hyperglycemia, unspecified: Secondary | ICD-10-CM | POA: Insufficient documentation

## 2012-12-17 ENCOUNTER — Ambulatory Visit: Payer: Medicaid Other | Admitting: Radiation Oncology

## 2012-12-20 ENCOUNTER — Ambulatory Visit
Admission: RE | Admit: 2012-12-20 | Discharge: 2012-12-20 | Disposition: A | Discharge status: Home / Self Care | Payer: Medicaid Other | Source: Ambulatory Visit | Attending: Radiation Oncology | Admitting: Radiation Oncology

## 2012-12-20 ENCOUNTER — Encounter: Payer: Self-pay | Admitting: Radiation Oncology

## 2012-12-20 ENCOUNTER — Other Ambulatory Visit: Payer: Self-pay | Admitting: *Deleted

## 2012-12-20 VITALS — BP 146/100 | HR 90 | Temp 98.6°F | Ht 67.0 in | Wt 318.3 lb

## 2012-12-20 DIAGNOSIS — C50911 Malignant neoplasm of unspecified site of right female breast: Secondary | ICD-10-CM

## 2012-12-20 DIAGNOSIS — C50919 Malignant neoplasm of unspecified site of unspecified female breast: Secondary | ICD-10-CM

## 2012-12-20 HISTORY — DX: Personal history of irradiation: Z92.3

## 2012-12-20 NOTE — Progress Notes (Signed)
Radiation Oncology         ([phone]) [phone] ________________________________  Name: [name] MRN: [id number]  Date: 12/20/2012  DOB: Nov 14, 1975  Follow-Up Visit Note  CC: Tomma Lightning, MD  Magrinat, Valentino Hue, MD  Diagnosis:   Locally advanced right breast cancer  Interval Since Last Radiation:  10-1/2 months   Narrative:  The patient returns today for routine follow-up.  She seems to be doing reasonably well. She continues to be somewhat down after losing her mother last year. She does not wish to consider medication for this issue. She denies any pain along the right mastectomy scar region. She does complain of pain along the subcutaneous tissues inferior to her treatment area. Denies a problems with swelling in her right arm or hand. She continues followup in medical oncology on a regular basis and will be seen tomorrow. I have asked that she discuss her discomfort with them. She also complains of a itching sensation along the right chest wall area deep inside and not on the outside skin region.                              ALLERGIES:  is allergic to oxycodone and tape.  Meds: Current Outpatient Prescriptions  Medication Sig Dispense Refill  . anastrozole (ARIMIDEX) 1 MG tablet Take 1 tablet (1 mg total) by mouth daily.  30 tablet  12  . B Complex-C (SUPER B COMPLEX PO) Take 1 tablet by mouth daily.      . benazepril-hydrochlorthiazide (LOTENSIN HCT) 20-12.5 MG per tablet Take 1 tablet by mouth 2 (two) times daily.      Marland Kitchen goserelin (ZOLADEX) 10.8 MG injection Inject 10.8 mg into the skin every 3 (three) months.      Marland Kitchen KLOR-CON M20 20 MEQ tablet TAKE ONE TABLET BY MOUTH DAILY  30 tablet  2  . metFORMIN (GLUCOPHAGE) 1000 MG tablet Take 1,000 mg by mouth 2 (two) times daily with a meal.      . naproxen sodium (ANAPROX) 220 MG tablet Take 220 mg by mouth 2 (two) times daily with a meal.      . pantoprazole (PROTONIX) 40 MG tablet TAKE ONE TABLET BY MOUTH EVERY DAY  30 tablet  0  .  ranitidine (ZANTAC) 75 MG tablet Take 75 mg by mouth as needed. ONLY IF OUT OF PROTONIX      . ferrous sulfate (FERRO-BOB) 325 (65 FE) MG tablet Take 325 mg by mouth daily with breakfast.       No current facility-administered medications for this encounter.    Physical Findings: The patient is in no acute distress. Patient is alert and oriented.  height is 5\' 7"  (1.702 m) and weight is 318 lb 4.8 oz (144.38 kg). Her temperature is 98.6 F (37 C). Her blood pressure is 146/100 and her pulse is 90. Marland Kitchen  No palpable supraclavicular or axillary adenopathy. The lungs are clear to auscultation. The heart has a regular rhythm and rate.  Examination of the left breast reveals to be large and pendulous without mass or nipple discharge. Examination right chest wall area reveals the skin to be well-healed. There is hyperpigmentation changes noted. There is no palpable or visible signs recurrence. Immediately inferior to the treatment area in the subcutaneous tissues of the right upper abdomen region the patient complains of a area of pain. There is an  area of thickening in this region which measures approximately 1 cm.  This is somewhat soft and not consistent with subcutaneous recurrence but have asked the patient to address this issue with medical oncology tomorrow on their exam.  Lab Findings: Lab Results  Component Value Date   WBC 6.0 09/20/2012   HGB 11.3* 09/20/2012   HCT 34.5* 09/20/2012   MCV 78.9* 09/20/2012   PLT 350 09/20/2012      Radiographic Findings: No results found.  Impression:  The patient is recovering from the effects of radiation.  No evidence of recurrence on clinical exam today.  The patient will discuss her subcutaneous discomfort with medical oncology tomorrow  Plan:  When necessary followup in radiation oncology. The patient will continue close followup in medical oncology.  _____________________________________  -----------------------------------  [name], PhD,  MD

## 2012-12-20 NOTE — Progress Notes (Addendum)
[name] here for fu post radiation therapy in June 2013 for  Locally advanced right breast cancer.  Today she denies any pain.  She is taking Arimidex and reports that she has at least 1 hotflash episode during the day and 1 prior to bedtime.  She also reports that she has pain in her knees and is experiencing pain in her calves; mostly on the right.  She admits that this pain started after she began working out, but she has not worked out in the past 2 months.  No eveidenc of edma or tightness in the bilateral ankles nor calves.

## 2012-12-21 ENCOUNTER — Encounter: Payer: Self-pay | Admitting: Physician Assistant

## 2012-12-21 ENCOUNTER — Other Ambulatory Visit (HOSPITAL_BASED_OUTPATIENT_CLINIC_OR_DEPARTMENT_OTHER): Payer: Medicaid Other | Admitting: Lab

## 2012-12-21 ENCOUNTER — Ambulatory Visit (HOSPITAL_BASED_OUTPATIENT_CLINIC_OR_DEPARTMENT_OTHER): Payer: Medicaid Other | Admitting: Physician Assistant

## 2012-12-21 ENCOUNTER — Ambulatory Visit: Payer: Medicaid Other

## 2012-12-21 ENCOUNTER — Telehealth: Payer: Self-pay | Admitting: *Deleted

## 2012-12-21 VITALS — BP 133/89 | HR 91 | Temp 97.9°F | Resp 20 | Ht 67.0 in | Wt 314.8 lb

## 2012-12-21 DIAGNOSIS — D509 Iron deficiency anemia, unspecified: Secondary | ICD-10-CM

## 2012-12-21 DIAGNOSIS — Z853 Personal history of malignant neoplasm of breast: Secondary | ICD-10-CM

## 2012-12-21 DIAGNOSIS — R234 Changes in skin texture: Secondary | ICD-10-CM

## 2012-12-21 DIAGNOSIS — C50911 Malignant neoplasm of unspecified site of right female breast: Secondary | ICD-10-CM

## 2012-12-21 DIAGNOSIS — C50919 Malignant neoplasm of unspecified site of unspecified female breast: Secondary | ICD-10-CM

## 2012-12-21 DIAGNOSIS — Z86718 Personal history of other venous thrombosis and embolism: Secondary | ICD-10-CM

## 2012-12-21 DIAGNOSIS — Z1231 Encounter for screening mammogram for malignant neoplasm of breast: Secondary | ICD-10-CM

## 2012-12-21 DIAGNOSIS — Z170 Estrogen receptor positive status [ER+]: Secondary | ICD-10-CM

## 2012-12-21 DIAGNOSIS — C773 Secondary and unspecified malignant neoplasm of axilla and upper limb lymph nodes: Secondary | ICD-10-CM

## 2012-12-21 DIAGNOSIS — C50419 Malignant neoplasm of upper-outer quadrant of unspecified female breast: Secondary | ICD-10-CM

## 2012-12-21 LAB — CBC WITH DIFFERENTIAL/PLATELET
BASO%: 0.4 % (ref 0.0–2.0)
EOS%: 2.8 % (ref 0.0–7.0)
MCH: 25 pg — ABNORMAL LOW (ref 25.1–34.0)
MCHC: 31.7 g/dL (ref 31.5–36.0)
MONO#: 0.6 10*3/uL (ref 0.1–0.9)
NEUT%: 68.4 % (ref 38.4–76.8)
RBC: 4.66 10*6/uL (ref 3.70–5.45)
RDW: 13.8 % (ref 11.2–14.5)
WBC: 6.6 10*3/uL (ref 3.9–10.3)
lymph#: 1.3 10*3/uL (ref 0.9–3.3)

## 2012-12-21 LAB — COMPREHENSIVE METABOLIC PANEL (CC13)
ALT: 23 U/L (ref 0–55)
AST: 23 U/L (ref 5–34)
Calcium: 10.2 mg/dL (ref 8.4–10.4)
Chloride: 101 mEq/L (ref 98–107)
Creatinine: 0.9 mg/dL (ref 0.6–1.1)
Potassium: 4 mEq/L (ref 3.5–5.1)
Sodium: 142 mEq/L (ref 136–145)
Total Protein: 8 g/dL (ref 6.4–8.3)

## 2012-12-21 MED ORDER — GOSERELIN ACETATE 10.8 MG ~~LOC~~ IMPL
10.8000 mg | DRUG_IMPLANT | SUBCUTANEOUS | Status: DC
  Administered 2012-12-21: 10.8 mg via SUBCUTANEOUS
  Filled 2012-12-21: qty 10.8

## 2012-12-21 NOTE — Progress Notes (Signed)
ID: [name]   DOB: [birth year]  MR#: [id number]  XLK#:[id number]  PCP: Tomma Lightning, MD GYN: SUFelicity Pellegrini OTHER MD:  HISTORY OF PRESENT ILLNESS: The patient developed pain in her right breast and brought it to her primary care physician's attention. Mammography at [facility] 03/10/2011 found the breasts to be heterogeneously dense. There was a mildly irregular mass posteriorly in the upper outer quadrant of the right breast. On ultrasound this was hypoechoic and measured approximately 2.4 cm, with irregular margins. The left breast was unremarkable.  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 Bilateral breast MRIs were obtained 03/18/2011, showing in the upper outer quadrant of the right breast an irregular mass measuring 5.5 cm. There was a suggestion of cortical thickening in the level I right axillary lymph node, which measured 1.2 cm. The patient was staged with chest CT scan and PET scan, which showed no distant disease. These studies did confirm the abnormality in the right breast as well as a hypermetabolic right axillary lymph node, making this a clinical T3 N1 or stage III invasive ductal carcinoma.  She completed neoadjuvant chemotherapy and proceeded to surgery and subsequent treatment as detailed below..  INTERVAL HISTORY: [name] returns today for followup of her right breast cancer.  [name]'s family is doing well she tells me. She continues to work full time.  She has been exercising more frequently, although she's taken a break the past few weeks due to knee pain.  Her right knee started hurting first, and this occurred after riding the stationary bike.  Now her left knee is bothering her as well.  She has no significant joint pain elsewhere.     [name] continues on anastrazole with good tolerance. She receives  Zoladex injections every 3 months, due again today.  She has occasional hot flashes, but she doesn't consider them to be problematic.  She still has difficulty sleeping, but doesn't think this is related at all to hot flashes.  She just has a difficult time going to sleep sometimes.  She denies abnormal bleeding, specifically vaginal bleeding, but does have vaginal dryness.     REVIEW OF SYSTEMS: Otherwise, [name] denies any recent fevers or chills.  She has had nausea only if she doesn't eat.  She denies changes in bowel or bladder habits.  No cough, increased shortness of breath or chest pain.  She has occasional headaches (a recent brain MRI was benign).  She has chronic back pain which has not worsened.  She occasionally has swelling in the legs, although not currently.  A detailed review of systems was otherwise noncontributory  PAST MEDICAL HISTORY: Past Medical History  Diagnosis Date  . Back pain     lumbar   . Anemia   . GERD (gastroesophageal reflux disease)   . Status post chemotherapy     finished 08/2011  . H/O scoliosis   . Peripheral neuropathy     s/p chemotherapy  . Hypertension     under control with med., has been on med. x 5 yr.  . Depression     no current med.  Marland Kitchen Headache     hx. migraine in childhood  . Breast cancer 2012    right  . Gastric ulcer   . Cough 09/07/2012  . S/P radiation therapy 12/19/11 - 02/03/12    Right Chest Wall  and Axilla    PAST SURGICAL HISTORY: Past Surgical History  Procedure Laterality Date  . Hip pinning  age 45    bilateral  . Portacath placement  04/08/2011  . Mastectomy w/ sentinel node biopsy  10/14/2011    Procedure: MASTECTOMY WITH SENTINEL LYMPH NODE BIOPSY;  Surgeon: Robyne Askew, MD;  Location: [facility] OR;  Service: General;  Laterality: Right;  . Wound exploration  10/14/2011    right mastectomy wound  . Esophagogastroduodenoscopy (egd) with propofol  09/06/2012    Procedure: ESOPHAGOGASTRODUODENOSCOPY (EGD) WITH PROPOFOL;   Surgeon: Rachael Fee, MD;  Location: [facility];  Service: Endoscopy;  Laterality: N/A;  . Port-a-cath removal  09/12/2012    Procedure: REMOVAL PORT-A-CATH;  Surgeon: Robyne Askew, MD;  Location: [facility];  Service: General;  Laterality: Left;    FAMILY HISTORY Family History  Problem Relation Age of Onset  . Heart disease Father     heart attack  . Heart attack Father     died age 57  . Hypertension Father   . Hypertension Mother   . Anemia Mother   . Diabetes Mother   . Hypertension Brother   . Diabetes Brother   . Heart disease Maternal Grandmother     MI  . Hypertension Maternal Grandmother   . Diabetes Maternal Grandmother   . Hypertension Paternal Grandmother   . Stroke Mother   . Aneurysm Mother     brain  . Diabetes Paternal Grandmother   [name] died from a stroke at age 5. The patient's father died from a myocardial infarction at the age of 55. She has one sister and one brother. There is no history of breast or ovarian cancer in the family.   GYNECOLOGIC HISTORY: Menarche age 19. She was premenopausal at the time of diagnosis. She has never used birth control pills. She is GX P2. First pregnancy to term age 36. She had her last period at the time of her surgery.  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 f Her husband, Zawadi Aplin, works in Holiday representative. Daughters, Rob[place], 17, and [place], [place], both live at home with the patient and her husband.    ADVANCED DIRECTIVES:  HEALTH MAINTENANCE: History  Substance Use Topics  . Smoking status: Former Games developer  . Smokeless tobacco: Never Used     Comment: quit 1998 - states only smoked for 1 month  . Alcohol Use: Yes     Comment: rarely     Colonoscopy:  PAP:  Bone density:  Lipid panel:  Allergies  Allergen Reactions  . Oxycodone Other (See Comments)    headache  . Tape Other (See Comments)    SKIN TEARS    Current Outpatient Prescriptions   Medication Sig Dispense Refill  . anastrozole (ARIMIDEX) 1 MG tablet Take 1 tablet (1 mg total) by mouth daily.  30 tablet  12  . B Complex-C (SUPER B COMPLEX PO) Take 1 tablet by mouth daily.      . benazepril-hydrochlorthiazide (LOTENSIN HCT) 20-12.5 MG per tablet Take 1 tablet by mouth 2 (two) times daily.      . ferrous sulfate (FERRO-BOB) 325 (65 FE) MG tablet Take 325 mg by mouth daily with breakfast.      . goserelin (ZOLADEX) 10.8 MG injection Inject 10.8 mg into the skin every 3 (three) months.      Marland Kitchen KLOR-CON M20 20 MEQ tablet TAKE ONE TABLET BY MOUTH DAILY  30 tablet  2  . metFORMIN (GLUCOPHAGE) 1000 MG tablet Take 1,000 mg by mouth 2 (two) times daily with a meal.      . naproxen sodium (ANAPROX) 220 MG tablet Take 220 mg by mouth 2 (two) times daily with a meal.      . pantoprazole (PROTONIX) 40 MG tablet TAKE ONE TABLET BY MOUTH EVERY DAY  30 tablet  0  . ranitidine (ZANTAC) 75 MG tablet Take 75 mg by mouth as needed. ONLY IF OUT OF PROTONIX       Current Facility-Administered Medications  Medication Dose Route Frequency Provider Last Rate Last Dose  . goserelin (ZOLADEX) injection 10.8 mg  10.8 mg Subcutaneous Q90 days Catalina Gravel, PA-C   10.8 mg at 12/21/12 1114    OBJECTIVE: Young African American woman in no acute distress Filed Vitals:   12/21/12 1032  BP: 133/89  Pulse: 91  Temp: 97.9 F (36.6 C)  Resp: 20     Body mass index is 49.29 kg/(m^2).    ECOG FS: 1  Filed Weights   12/21/12 1032  Weight: 314 lb 12.8 oz (142.792 kg)   Oropharynx clear  No cervical or supraclavicular adenopathy  Lungs clear to auscultation with no crackles or wheezes Heart regular rate and rhythm Abdomen obese, soft, nontender to palpation, positive bowel sounds Musculoskeletal no focal spinal tenderness to palpation Nonpitting pedal edema, equal bilaterally.  No upper extremity edema Neurologic exam is nonfocal, well oriented with ppropriate affect The right breast is status post  mastectomy and radiation. Normal hyperpigmentation noted following radiation. The area of concern is a thickening beneath the skin, approximately 1 cm in diameter, and located just below the right mastectomy site. The left breast is unremarkable.    LAB RESULTS: Lab Results  Component Value Date   WBC 6.6 12/21/2012   NEUTROABS 4.5 12/21/2012   HGB 11.6 12/21/2012   HCT 36.8 12/21/2012   MCV 78.8* 12/21/2012   PLT 356 12/21/2012      Chemistry      Component Value Date/Time   NA 142 12/21/2012 1019   NA 142 09/12/2012 1154   K 4.0 12/21/2012 1019   K 3.5 09/12/2012 1154   CL 101 12/21/2012 1019   CL 99 09/12/2012 1154   CO2 28 12/21/2012 1019   CO2 30 04/04/2012 1456   BUN 12.2 12/21/2012 1019   BUN 11 09/12/2012 1154   CREATININE 0.9 12/21/2012 1019   CREATININE 0.80 09/12/2012 1154      Component Value Date/Time   CALCIUM 10.2 12/21/2012 1019   CALCIUM 9.8 04/04/2012 1456   ALKPHOS 129 12/21/2012 1019   ALKPHOS 113 04/04/2012 1456   AST 23 12/21/2012 1019   AST 26 04/04/2012 1456   ALT 23 12/21/2012 1019   ALT 24 04/04/2012 1456   BILITOT 0.69 12/21/2012 1019   BILITOT 0.4 04/04/2012 1456       STUDIES:  Left mammogram at [facility] on 03/12/2012 was unremarkable.  Brain MRI in February 2014 was unremarkable.    ASSESSMENT: 37 year old BRCA 1-2 negative [place] woman   (1) status post right breast biopsy July of 2012 for a clinical T3 N1 (stage III) invasive ductal carcinoma, grade 3, 99% estrogen and  100% progesterone receptor positive, HER-2 negative, with an MIB-1 of 98%.   (2) Status post 4 cycles of dose dense doxorubicin and cyclophosphamide, followed by weekly paclitaxel x 8, completed 08/25/2011  (3) s/p right mastectomy and sentinel lymph node sampling 10/14/2011 for a  residual ypT2 ypN1(mic) invasive ductal carcinoma, grade 3, with ample margins  (4) status post radiation therapy under the care of Dr. Roselind Messier,  completed in mid June.  (5)  Anti-estrogen therapy following radiation, with  first Zoladex injection given on 01/18/2012, and to be continued every 3 months. Patient has a history of DVT, and was started on anastrozole rather than tamoxifen, the anastrazole started in late June 2013, the goal being to continue for at least 5 years (June 2018).  PLAN:  Raahi seems to be doing well as far as her breast cancer is concerned. She is very worries, however, about the thickening in the area just below the right mastectomy incision, and I think it would be most prudent to have that area evaluated further.  I am requesting an ultrasound of this area accordingly.    Al will continue on anastrazole.  The plan is to continue anastrozole for a minimum of 5 years. She will receive her next every 3 week injection of goserelin today and again when she returns for follow up in 12 weeks.  Just prior to that appointment, she will have her annual left mammogram.  Gini voices agreement with this plan, and know to call with any changes or problems prior to her next visit.   Carma Dwiggins    12/21/2012

## 2012-12-21 NOTE — Telephone Encounter (Signed)
appts made and printed.Also, gv appts for [facility]...td

## 2012-12-25 ENCOUNTER — Other Ambulatory Visit: Payer: Self-pay | Admitting: *Deleted

## 2012-12-31 ENCOUNTER — Ambulatory Visit (INDEPENDENT_AMBULATORY_CARE_PROVIDER_SITE_OTHER): Payer: Medicaid Other | Admitting: General Surgery

## 2012-12-31 ENCOUNTER — Encounter (INDEPENDENT_AMBULATORY_CARE_PROVIDER_SITE_OTHER): Payer: Self-pay | Admitting: General Surgery

## 2012-12-31 VITALS — BP 112/82 | HR 96 | Temp 96.5°F | Ht 67.0 in | Wt 313.2 lb

## 2012-12-31 DIAGNOSIS — C50919 Malignant neoplasm of unspecified site of unspecified female breast: Secondary | ICD-10-CM

## 2012-12-31 DIAGNOSIS — C50911 Malignant neoplasm of unspecified site of right female breast: Secondary | ICD-10-CM

## 2012-12-31 NOTE — Patient Instructions (Signed)
Continue regular self exams Continue arimidex 

## 2012-12-31 NOTE — Progress Notes (Signed)
Subjective:     Patient ID: [name], female   DOB: 08-Nov-1975, 37 y.o.   MRN: [id number]  HPI The patient is a 37 year old black female who is one year and 3 months status post right mastectomy and sentinel node biopsy for a T2 N83mic right breast cancer. She is taking anastrozole and tolerating that well. Since her last visit she was diagnosed with diabetes and is being treated. She otherwise has no complaints. She denies any chest wall pain. She is due for her next mammogram in July.  Review of Systems  Constitutional: Negative.   HENT: Negative.   Eyes: Negative.   Respiratory: Negative.   Cardiovascular: Negative.   Gastrointestinal: Negative.   Endocrine: Negative.   Genitourinary: Negative.   Musculoskeletal: Negative.   Skin: Negative.   Allergic/Immunologic: Negative.   Neurological: Negative.   Hematological: Negative.   Psychiatric/Behavioral: Negative.        Objective:   Physical Exam  Constitutional: She is oriented to person, place, and time. She appears well-developed and well-nourished.  HENT:  Head: Normocephalic and atraumatic.  Eyes: Conjunctivae and EOM are normal. Pupils are equal, round, and reactive to light.  Neck: Normal range of motion. Neck supple.  Cardiovascular: Normal rate, regular rhythm and normal heart sounds.   Pulmonary/Chest: Effort normal and breath sounds normal.  There is no palpable mass of the right chest wall. There is no palpable mass of the left breast. There is no palpable axillary supraclavicular or cervical lymphadenopathy  Abdominal: Soft. Bowel sounds are normal. She exhibits no mass. There is no tenderness.  Musculoskeletal: Normal range of motion.  Lymphadenopathy:    She has no cervical adenopathy.  Neurological: She is alert and oriented to person, place, and time.  Skin: Skin is warm and dry.  Psychiatric: She has a normal mood and affect. Her behavior is normal.       Assessment:     The patient is one year and  3 months status post right mastectomy for breast cancer     Plan:     At this point she will continue to take anastrozole. She will continue to do regular self exams. We will plan to see her back in about 6 months.

## 2013-01-11 IMAGING — CT CT HEAD W/O CM
1 of 2 series · 13 of 30 positions shown, 17 images · non-contrast
Comparison: None.

CLINICAL DATA: Breast cancer.  Headaches.

CT HEAD WITHOUT CONTRAST
TECHNIQUE: Contiguous axial images were obtained from the base of
the skull through the vertex without contrast.

[Series 2: headseq 4.8 h45s · axial · 0.40mm/px · z∈[+1251,+1389]mm · 13 of 35 slices shown, 17 images]
[im 3/35  brain]
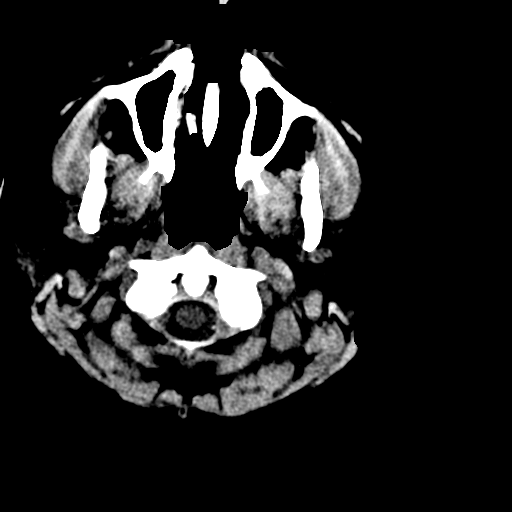
[im 3/35  bone]
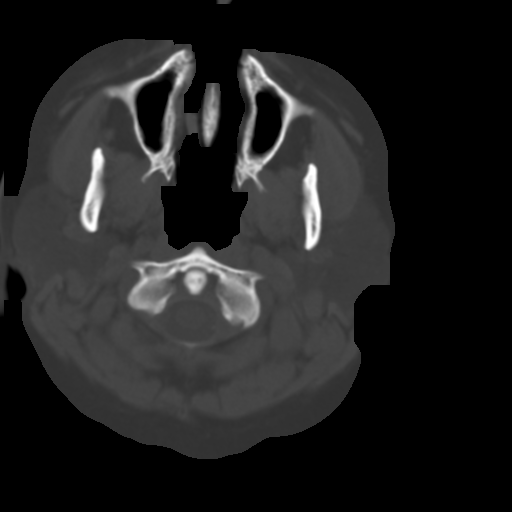
[im 5/35  brain]
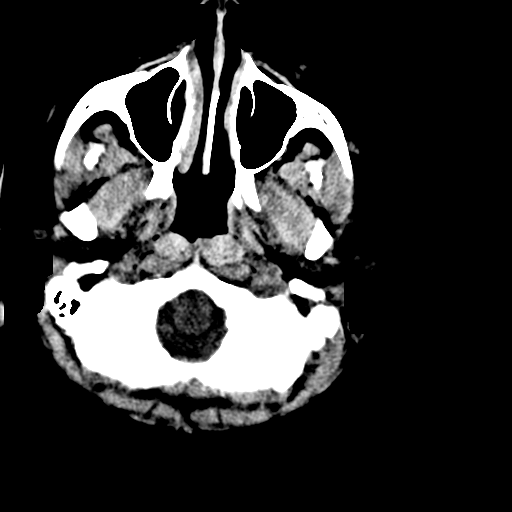
[im 8/35  brain]
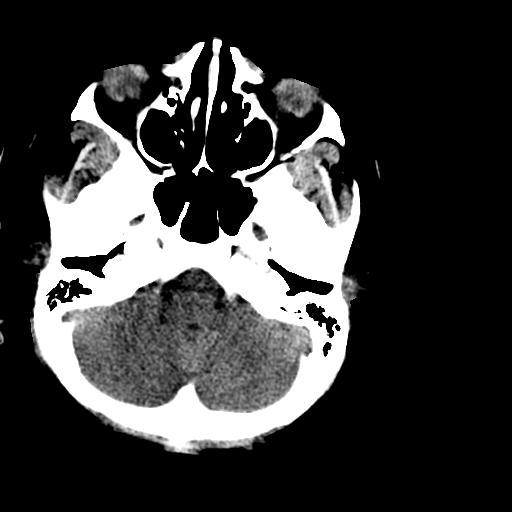
[im 10/35  brain]
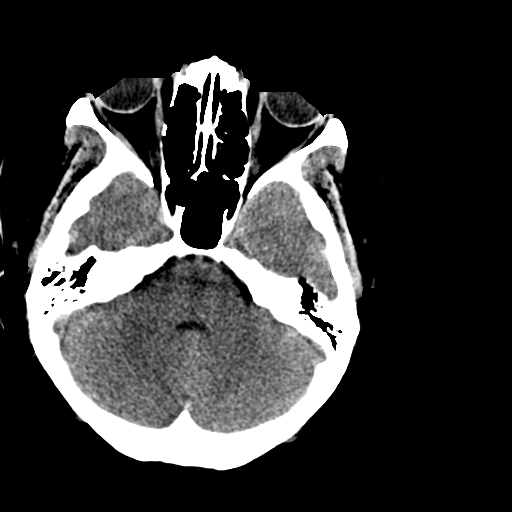
[im 13/35  brain]
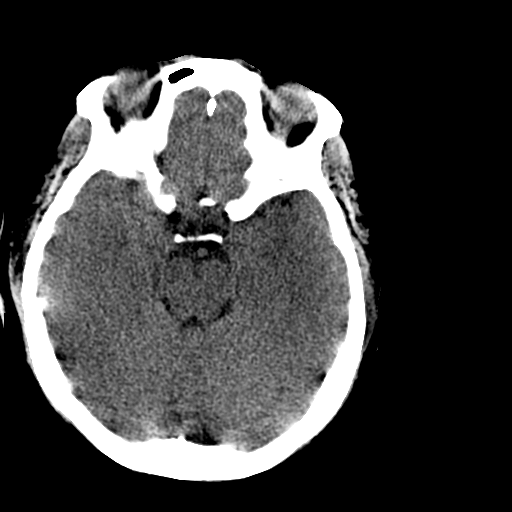
[im 13/35  bone]
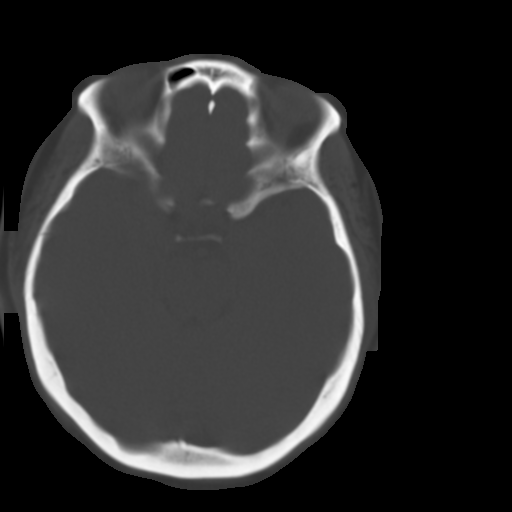
[im 15/35  brain]
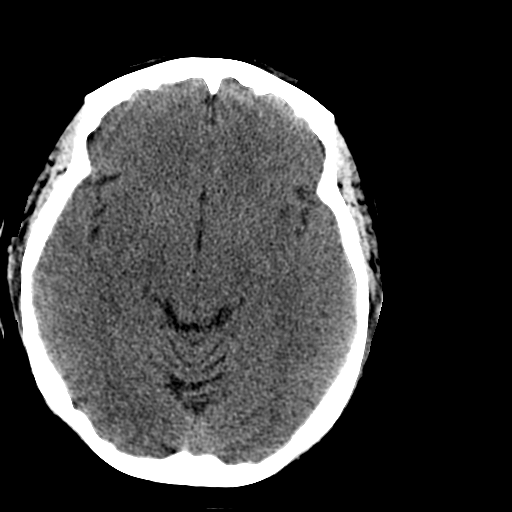
[im 18/35  brain]
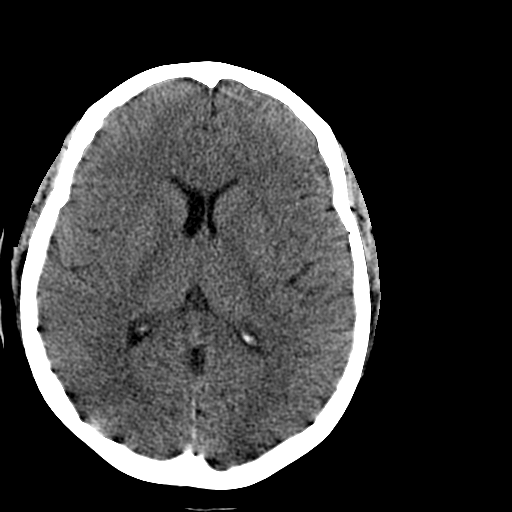
[im 20/35  brain]
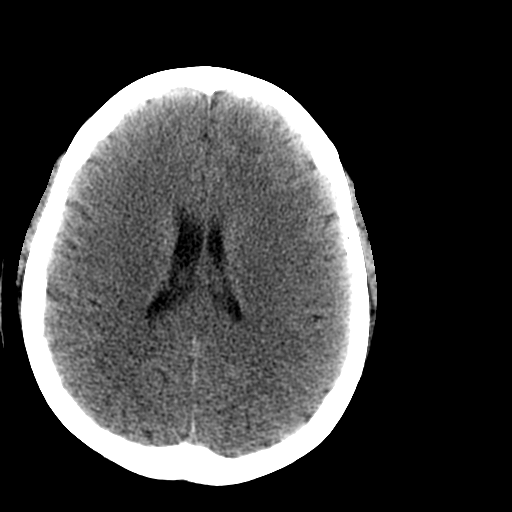
[im 22/35  brain]
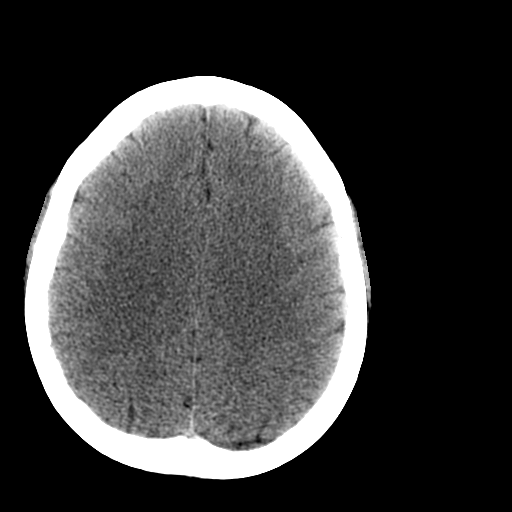
[im 22/35  bone]
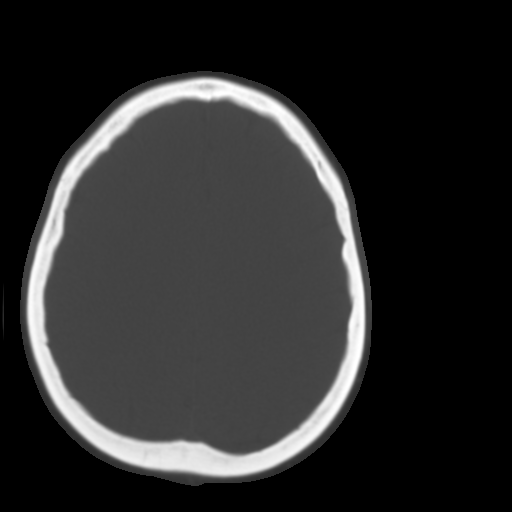
[im 25/35  brain]
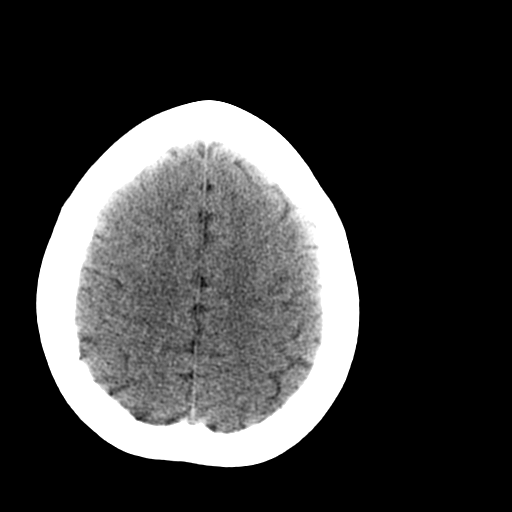
[im 27/35  brain]
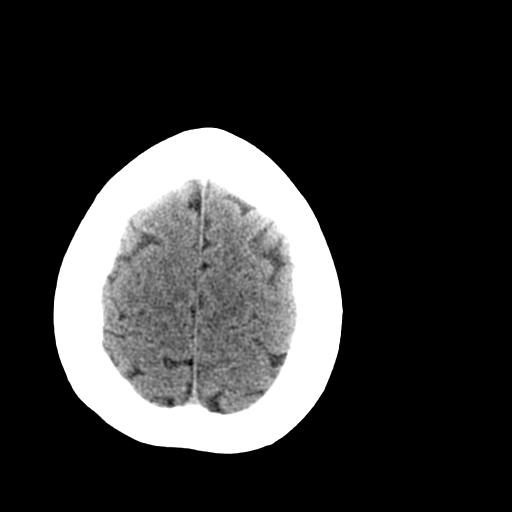
[im 30/35  brain]
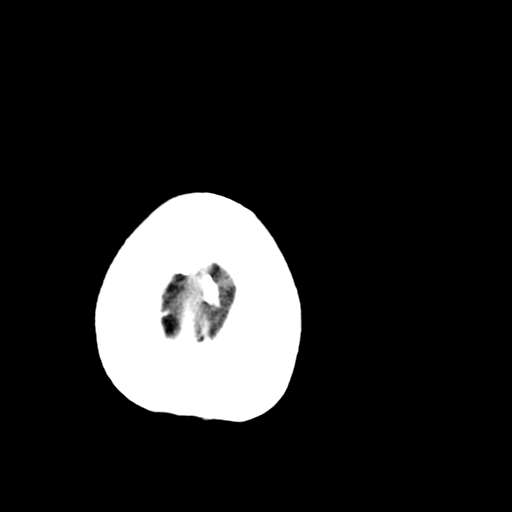
[im 32/35  brain]
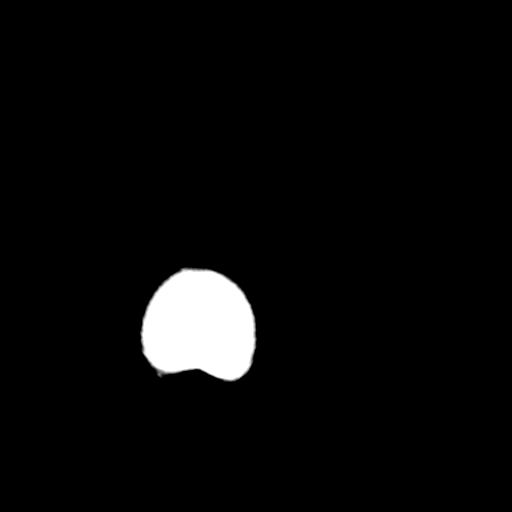
[im 32/35  bone]
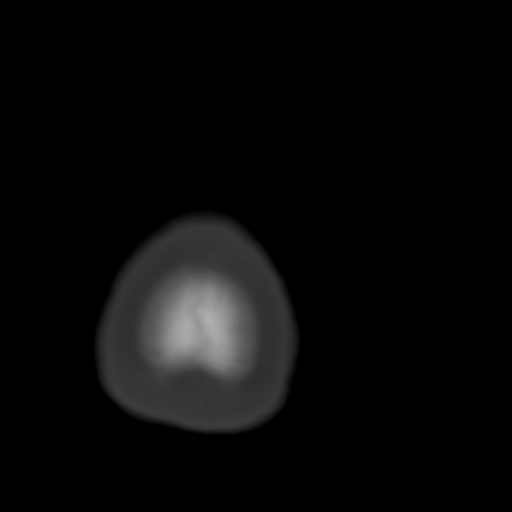

[13 of 30 positions shown; findings below may reference images not displayed]

FINDINGS: No acute intracranial abnormality.  Specifically, no
hemorrhage, hydrocephalus, mass lesion, acute infarction, or
significant intracranial injury.  No acute calvarial abnormality.
Visualized paranasal sinuses and mastoids clear.  Orbital soft
tissues unremarkable.
IMPRESSION: Normal study.

## 2013-03-07 ENCOUNTER — Other Ambulatory Visit (HOSPITAL_BASED_OUTPATIENT_CLINIC_OR_DEPARTMENT_OTHER): Payer: Medicaid Other

## 2013-03-07 DIAGNOSIS — C50911 Malignant neoplasm of unspecified site of right female breast: Secondary | ICD-10-CM

## 2013-03-07 DIAGNOSIS — C50919 Malignant neoplasm of unspecified site of unspecified female breast: Secondary | ICD-10-CM

## 2013-03-07 LAB — CBC WITH DIFFERENTIAL/PLATELET
BASO%: 0.4 % (ref 0.0–2.0)
EOS%: 3.2 % (ref 0.0–7.0)
HCT: 36.3 % (ref 34.8–46.6)
LYMPH%: 26.4 % (ref 14.0–49.7)
MCH: 25 pg — ABNORMAL LOW (ref 25.1–34.0)
MCHC: 30.9 g/dL — ABNORMAL LOW (ref 31.5–36.0)
NEUT%: 62.2 % (ref 38.4–76.8)
Platelets: 355 10*3/uL (ref 145–400)
RBC: 4.48 10*6/uL (ref 3.70–5.45)
lymph#: 1.4 10*3/uL (ref 0.9–3.3)

## 2013-03-07 LAB — COMPREHENSIVE METABOLIC PANEL (CC13)
ALT: 45 U/L (ref 0–55)
AST: 32 U/L (ref 5–34)
Creatinine: 0.8 mg/dL (ref 0.6–1.1)
Sodium: 146 mEq/L — ABNORMAL HIGH (ref 136–145)
Total Bilirubin: 0.44 mg/dL (ref 0.20–1.20)

## 2013-03-14 ENCOUNTER — Telehealth: Payer: Self-pay | Admitting: *Deleted

## 2013-03-14 ENCOUNTER — Ambulatory Visit (HOSPITAL_BASED_OUTPATIENT_CLINIC_OR_DEPARTMENT_OTHER): Payer: Medicaid Other | Admitting: Oncology

## 2013-03-14 ENCOUNTER — Ambulatory Visit (HOSPITAL_BASED_OUTPATIENT_CLINIC_OR_DEPARTMENT_OTHER): Payer: Medicaid Other

## 2013-03-14 VITALS — BP 140/94 | HR 83 | Temp 99.2°F | Resp 20 | Ht 67.0 in | Wt 306.6 lb

## 2013-03-14 DIAGNOSIS — Z86718 Personal history of other venous thrombosis and embolism: Secondary | ICD-10-CM

## 2013-03-14 DIAGNOSIS — C50419 Malignant neoplasm of upper-outer quadrant of unspecified female breast: Secondary | ICD-10-CM

## 2013-03-14 DIAGNOSIS — F329 Major depressive disorder, single episode, unspecified: Secondary | ICD-10-CM

## 2013-03-14 DIAGNOSIS — Z1231 Encounter for screening mammogram for malignant neoplasm of breast: Secondary | ICD-10-CM

## 2013-03-14 DIAGNOSIS — C50919 Malignant neoplasm of unspecified site of unspecified female breast: Secondary | ICD-10-CM

## 2013-03-14 DIAGNOSIS — C50911 Malignant neoplasm of unspecified site of right female breast: Secondary | ICD-10-CM

## 2013-03-14 DIAGNOSIS — Z5111 Encounter for antineoplastic chemotherapy: Secondary | ICD-10-CM

## 2013-03-14 DIAGNOSIS — Z170 Estrogen receptor positive status [ER+]: Secondary | ICD-10-CM

## 2013-03-14 MED ORDER — GOSERELIN ACETATE 10.8 MG ~~LOC~~ IMPL
10.8000 mg | DRUG_IMPLANT | SUBCUTANEOUS | Status: DC
  Administered 2013-03-14: 10.8 mg via SUBCUTANEOUS
  Filled 2013-03-14: qty 10.8

## 2013-03-14 NOTE — Telephone Encounter (Signed)
appts made and printed. Pt has an appt for [facility] today 03/14/13 @ 2pm. Pt is aware...td

## 2013-03-14 NOTE — Progress Notes (Signed)
ID: [name]   DOB: Nov 17, 1975  MR#: [id number]  UXL#:[id number]  PCP: Dois Davenport., MD GYN: SUFelicity Pellegrini OTHER MD:  HISTORY OF PRESENT ILLNESS: The patient developed pain in her right breast and brought it to her primary care physician's attention. Mammography at [facility] 03/10/2011 found the breasts to be heterogeneously dense. There was a mildly irregular mass posteriorly in the upper outer quadrant of the right breast. On ultrasound this was hypoechoic and measured approximately 2.4 cm, with irregular margins. The left breast was unremarkable.  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 Bilateral breast MRIs were obtained 03/18/2011, showing in the upper outer quadrant of the right breast an irregular mass measuring 5.5 cm. There was a suggestion of cortical thickening in the level I right axillary lymph node, which measured 1.2 cm. The patient was staged with chest CT scan and PET scan, which showed no distant disease. These studies did confirm the abnormality in the right breast as well as a hypermetabolic right axillary lymph node, making this a clinical T3 N1 or stage III invasive ductal carcinoma.  She completed neoadjuvant chemotherapy and proceeded to surgery and subsequent treatment as detailed below..  INTERVAL HISTORY: Katlynn returns today for followup of her breast cancer. The interval history is significant for her having stopped school for the time being (she plans to resume September of 2015) and going back to work at [employer].   REVIEW OF SYSTEMS: She is taking metformin which is improving her sugars but causing her of 3-5 bowel movements a day. She is got a little bit of hemorrhoidal bleeding as a result. She has mild nausea, no vomiting. She has chronic back pain. Her headaches are not more  frequent or intense than before. There have been no visual changes, no dizziness or gait imbalance. She feels depressed, not actually anxious, certainly not suicidal. Hot flashes are moderate. A detailed review of systems today was otherwise noncontributory.  PAST MEDICAL HISTORY: Past Medical History  Diagnosis Date  . Back pain     lumbar   . Anemia   . GERD (gastroesophageal reflux disease)   . Status post chemotherapy     finished 08/2011  . H/O scoliosis   . Peripheral neuropathy     s/p chemotherapy  . Hypertension     under control with med., has been on med. x 5 yr.  . Depression     no current med.  Marland Kitchen Headache(784.0)     hx. migraine in childhood  . Breast cancer 2012    right  . Gastric ulcer   . Cough 09/07/2012  . S/P radiation therapy 12/19/11 - 02/03/12    Right Chest Wall and Axilla  . Diabetes mellitus without complication     PAST SURGICAL HISTORY: Past Surgical History  Procedure Laterality Date  . Hip pinning  age 56    bilateral  . Portacath placement  04/08/2011  . Mastectomy w/ sentinel node biopsy  10/14/2011    Procedure: MASTECTOMY WITH SENTINEL LYMPH NODE BIOPSY;  Surgeon: Robyne Askew, MD;  Location: [facility];  Service: General;  Laterality: Right;  . Wound exploration  10/14/2011    right mastectomy wound  . Esophagogastroduodenoscopy (egd) with propofol  09/06/2012    Procedure: ESOPHAGOGASTRODUODENOSCOPY (EGD) WITH PROPOFOL;  Surgeon: Rachael Fee, MD;  Location:  [facility];  Service: Endoscopy;  Laterality: N/A;  . Port-a-cath removal  09/12/2012    Procedure: REMOVAL PORT-A-CATH;  Surgeon: Robyne Askew, MD;  Location: [facility];  Service: General;  Laterality: Left;    FAMILY HISTORY Family History  Problem Relation Age of Onset  . Heart disease Father     heart attack  . Heart attack Father     died age 60  . Hypertension Father   . Hypertension Mother   . Anemia Mother   . Diabetes Mother   . Hypertension Brother    . Diabetes Brother   . Heart disease Maternal Grandmother     MI  . Hypertension Maternal Grandmother   . Diabetes Maternal Grandmother   . Hypertension Paternal Grandmother   . Stroke Mother   . Aneurysm Mother     brain  . Diabetes Paternal Grandmother   [name] died from a stroke at age 24. The patient's father died from a myocardial infarction at the age of 54. She has one sister and one brother. There is no history of breast or ovarian cancer in the family.   GYNECOLOGIC HISTORY: Menarche age 74. She was premenopausal at the time of diagnosis. She has never used birth control pills. She is GX P2. First pregnancy to term age 19. She had her last period at the time of her surgery.  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 Her husband, Kharlie Bring, works in Holiday representative. Daughters, [place], 17, and [place], [place], both live at home with the patient and her husband.    ADVANCED DIRECTIVES:  HEALTH MAINTENANCE: History  Substance Use Topics  . Smoking status: Former Games developer  . Smokeless tobacco: Never Used     Comment: quit 1998 - states only smoked for 1 month  . Alcohol Use: Yes     Comment: rarely     Colonoscopy:  PAP:  Bone density:  Lipid panel:  Allergies  Allergen Reactions  . Oxycodone Other (See Comments)    headache  . Tape Other (See Comments)    SKIN TEARS    Current Outpatient Prescriptions  Medication Sig Dispense Refill  . anastrozole (ARIMIDEX) 1 MG tablet Take 1 tablet (1 mg total) by mouth daily.  30 tablet  12  . B Complex-C (SUPER B COMPLEX PO) Take 1 tablet by mouth daily.      . benazepril-hydrochlorthiazide (LOTENSIN HCT) 20-12.5 MG per tablet Take 1 tablet by mouth 2 (two) times daily.      . ferrous sulfate (FERRO-BOB) 325 (65 FE) MG tablet Take 325 mg by mouth daily with breakfast.      . goserelin (ZOLADEX) 10.8 MG injection Inject 10.8 mg into the skin every 3 (three) months.      Marland Kitchen KLOR-CON M20 20  MEQ tablet TAKE ONE TABLET BY MOUTH DAILY  30 tablet  2  . metFORMIN (GLUCOPHAGE) 500 MG tablet Take 500 mg by mouth 2 (two) times daily with a meal.      . naproxen sodium (ANAPROX) 220 MG tablet Take 220 mg by mouth 2 (two) times daily with a meal.      . pantoprazole (PROTONIX) 40 MG tablet TAKE ONE TABLET BY MOUTH EVERY DAY  30 tablet  0  . ranitidine (ZANTAC) 75 MG tablet Take 75 mg by mouth as needed. ONLY IF OUT OF PROTONIX       No current facility-administered medications for this visit.  OBJECTIVE: Young African American woman who appears mildly fatigued Filed Vitals:   03/14/13 0959  BP: 140/94  Pulse: 83  Temp: 99.2 F (37.3 C)  Resp: 20     Body mass index is 48.01 kg/(m^2).    ECOG FS: 1  Filed Weights   03/14/13 0959  Weight: 306 lb 9.6 oz (139.073 kg)   Oropharynx clear  No cervical or supraclavicular adenopathy  Lungs clear to auscultation bilaterally Heart regular rate and rhythm, no murmur appreciated Abdomen obese, soft, nontender to palpation, positive bowel sounds Musculoskeletal no focal spinal tenderness Nonpitting pedal edema, equal bilaterally.  No upper extremity edema Neuro nonfocal, well oriented, appropriate affect The right breast is status post mastectomy and radiation. There is no evidence of local recurrence. The right axilla is benign.. The left breast is unremarkable.    LAB RESULTS: Lab Results  Component Value Date   WBC 5.3 03/07/2013   NEUTROABS 3.3 03/07/2013   HGB 11.2* 03/07/2013   HCT 36.3 03/07/2013   MCV 81.0 03/07/2013   PLT 355 03/07/2013      Chemistry      Component Value Date/Time   NA 146* 03/07/2013 1021   NA 142 09/12/2012 1154   K 3.6 03/07/2013 1021   K 3.5 09/12/2012 1154   CL 101 12/21/2012 1019   CL 99 09/12/2012 1154   CO2 28 03/07/2013 1021   CO2 30 04/04/2012 1456   BUN 11.3 03/07/2013 1021   BUN 11 09/12/2012 1154   CREATININE 0.8 03/07/2013 1021   CREATININE 0.80 09/12/2012 1154      Component Value Date/Time    CALCIUM 10.0 03/07/2013 1021   CALCIUM 9.8 04/04/2012 1456   ALKPHOS 105 03/07/2013 1021   ALKPHOS 113 04/04/2012 1456   AST 32 03/07/2013 1021   AST 26 04/04/2012 1456   ALT 45 03/07/2013 1021   ALT 24 04/04/2012 1456   BILITOT 0.44 03/07/2013 1021   BILITOT 0.4 04/04/2012 1456       STUDIES: No new results found. Right chest wall ultrasonography was unremarkable 12/25/2012. Left breast mammogram is due this month.  ASSESSMENT: 37 y.o.  BRCA 1-2 negative [place] woman   (1) status post right breast upper outer quadrant biopsy 03-07-11 for a clinical T3 N1 (stage III) invasive ductal carcinoma, grade 3, 99% estrogen and  100% progesterone receptor positive, HER-2 negative, with an MIB-1 of 98%.   (2) Status post 4 cycles of dose dense doxorubicin and cyclophosphamide, followed by weekly paclitaxel x 8, completed 08/25/2011  (3) s/p right mastectomy and sentinel lymph node sampling 10/14/2011 for a residual ypT2 ypN1(mic) invasive ductal carcinoma, grade 3, with ample margins  (4) status post radiation therapy under the care of Dr. Roselind Messier,  completed in mid June.  (5)  Anti-estrogen therapy started 01/18/2012 with first Zoladex injection,  continued every 3 months. Anastrozole started in late June 2013, the goal being to continue for at least 5 years (June 2018).  (6) remote history of DVT  PLAN:  Shenna is doing well from a breast cancer point of view, now little over a year from her definitive surgery, with no evidence of active breast cancer. The plan is to continue the Zoladex and anastrozole indefinitely. Her next dose will be in 12 weeks. I am going to obtain an [facility] and estradiol level before that visit, to make sure the ovarian suppression and is adequate.  She is exploring the possibility of weight reduction pills. I know very little about  that except that some of them have been associated with significant pulmonary damage. She would do well to make sure Dr. Hal Hope is told of  one of her new medication Ansley is thinking of taking before she actually start.  Otherwise she knows to call for any problems that may develop before her next visit here. MAGRINAT,GUSTAV C    03/14/2013

## 2013-05-29 ENCOUNTER — Other Ambulatory Visit: Payer: Self-pay | Admitting: Physician Assistant

## 2013-05-29 DIAGNOSIS — C50919 Malignant neoplasm of unspecified site of unspecified female breast: Secondary | ICD-10-CM

## 2013-05-30 ENCOUNTER — Other Ambulatory Visit: Payer: Medicaid Other

## 2013-06-05 ENCOUNTER — Telehealth: Payer: Self-pay | Admitting: *Deleted

## 2013-06-05 NOTE — Telephone Encounter (Signed)
Pt called to rs her ftka form 10/9. gv appt for labs 1027/14@ 11am due to pt is out of town. She also canceled her appt for 06/06/13 and r/s to 06/25/13 @ 2:15pm. Pt is aware that i could not rs her inj for 06/06/13 i transferred her to the nurse...td

## 2013-06-06 ENCOUNTER — Ambulatory Visit: Payer: Medicaid Other | Admitting: Physician Assistant

## 2013-06-06 ENCOUNTER — Other Ambulatory Visit: Payer: Self-pay | Admitting: *Deleted

## 2013-06-06 ENCOUNTER — Ambulatory Visit: Payer: Medicaid Other

## 2013-06-06 NOTE — Progress Notes (Signed)
This RN spoke with pt per her request to reschedule zoladex injection. Pt is due for injection tomorrow but is [place] of state.   Per discussion with understanding of need to obtain ASAP this RN scheduled injection upon her return on 10/27.  No other needs at this time.

## 2013-06-17 ENCOUNTER — Other Ambulatory Visit (HOSPITAL_BASED_OUTPATIENT_CLINIC_OR_DEPARTMENT_OTHER): Payer: Medicaid Other | Admitting: Lab

## 2013-06-17 ENCOUNTER — Ambulatory Visit (HOSPITAL_BASED_OUTPATIENT_CLINIC_OR_DEPARTMENT_OTHER): Payer: Medicaid Other

## 2013-06-17 VITALS — BP 124/70 | HR 76 | Temp 98.9°F

## 2013-06-17 DIAGNOSIS — Z86718 Personal history of other venous thrombosis and embolism: Secondary | ICD-10-CM

## 2013-06-17 DIAGNOSIS — C50911 Malignant neoplasm of unspecified site of right female breast: Secondary | ICD-10-CM

## 2013-06-17 DIAGNOSIS — C50419 Malignant neoplasm of upper-outer quadrant of unspecified female breast: Secondary | ICD-10-CM

## 2013-06-17 DIAGNOSIS — C50919 Malignant neoplasm of unspecified site of unspecified female breast: Secondary | ICD-10-CM

## 2013-06-17 DIAGNOSIS — Z5111 Encounter for antineoplastic chemotherapy: Secondary | ICD-10-CM

## 2013-06-17 LAB — COMPREHENSIVE METABOLIC PANEL (CC13)
ALT: 22 U/L (ref 0–55)
AST: 24 U/L (ref 5–34)
Albumin: 3.6 g/dL (ref 3.5–5.0)
Alkaline Phosphatase: 94 U/L (ref 40–150)
BUN: 11.5 mg/dL (ref 7.0–26.0)
Chloride: 103 mEq/L (ref 98–109)
Potassium: 3.5 mEq/L (ref 3.5–5.1)
Sodium: 142 mEq/L (ref 136–145)
Total Bilirubin: 0.67 mg/dL (ref 0.20–1.20)
Total Protein: 7.6 g/dL (ref 6.4–8.3)

## 2013-06-17 LAB — CBC WITH DIFFERENTIAL/PLATELET
BASO%: 0.8 % (ref 0.0–2.0)
Basophils Absolute: 0.1 10*3/uL (ref 0.0–0.1)
Eosinophils Absolute: 0.4 10*3/uL (ref 0.0–0.5)
HCT: 34.9 % (ref 34.8–46.6)
HGB: 11.1 g/dL — ABNORMAL LOW (ref 11.6–15.9)
LYMPH%: 21.8 % (ref 14.0–49.7)
MCHC: 31.7 g/dL (ref 31.5–36.0)
MCV: 79.2 fL — ABNORMAL LOW (ref 79.5–101.0)
MONO%: 9 % (ref 0.0–14.0)
NEUT#: 4.1 10*3/uL (ref 1.5–6.5)
Platelets: 346 10*3/uL (ref 145–400)
RBC: 4.41 10*6/uL (ref 3.70–5.45)
RDW: 14 % (ref 11.2–14.5)
WBC: 6.7 10*3/uL (ref 3.9–10.3)
lymph#: 1.5 10*3/uL (ref 0.9–3.3)

## 2013-06-17 MED ORDER — GOSERELIN ACETATE 10.8 MG ~~LOC~~ IMPL
10.8000 mg | DRUG_IMPLANT | SUBCUTANEOUS | Status: DC
  Administered 2013-06-17: 10.8 mg via SUBCUTANEOUS
  Filled 2013-06-17: qty 10.8

## 2013-06-25 ENCOUNTER — Ambulatory Visit (HOSPITAL_BASED_OUTPATIENT_CLINIC_OR_DEPARTMENT_OTHER): Payer: Medicaid Other | Admitting: Physician Assistant

## 2013-06-25 ENCOUNTER — Encounter: Payer: Self-pay | Admitting: Physician Assistant

## 2013-06-25 ENCOUNTER — Telehealth: Payer: Self-pay | Admitting: Oncology

## 2013-06-25 ENCOUNTER — Telehealth: Payer: Self-pay | Admitting: *Deleted

## 2013-06-25 ENCOUNTER — Encounter (INDEPENDENT_AMBULATORY_CARE_PROVIDER_SITE_OTHER): Payer: Self-pay

## 2013-06-25 VITALS — BP 140/85 | HR 87 | Temp 98.4°F | Resp 18 | Ht 67.0 in | Wt 298.0 lb

## 2013-06-25 DIAGNOSIS — Z86718 Personal history of other venous thrombosis and embolism: Secondary | ICD-10-CM

## 2013-06-25 DIAGNOSIS — E2839 Other primary ovarian failure: Secondary | ICD-10-CM

## 2013-06-25 DIAGNOSIS — Z170 Estrogen receptor positive status [ER+]: Secondary | ICD-10-CM

## 2013-06-25 DIAGNOSIS — C50911 Malignant neoplasm of unspecified site of right female breast: Secondary | ICD-10-CM

## 2013-06-25 DIAGNOSIS — Z1231 Encounter for screening mammogram for malignant neoplasm of breast: Secondary | ICD-10-CM

## 2013-06-25 DIAGNOSIS — C50419 Malignant neoplasm of upper-outer quadrant of unspecified female breast: Secondary | ICD-10-CM

## 2013-06-25 DIAGNOSIS — D509 Iron deficiency anemia, unspecified: Secondary | ICD-10-CM

## 2013-06-25 MED ORDER — ANASTROZOLE 1 MG PO TABS: 1.0000 mg | ORAL_TABLET | Freq: Every day | ORAL | Status: DC

## 2013-06-25 NOTE — Telephone Encounter (Signed)
, °

## 2013-06-25 NOTE — Progress Notes (Signed)
ID: [name]   DOB: 1976/04/24  MR#: [id number]  NWG#:[id number]  PCP: Dois Davenport., MD GYN: SUChevis Pretty, MD RADONC:  Antony Blackbird, MD OTHER MD:  Rob Bunting, MD   CHIEF COMPLAINT:  Right Breast Cancer   HISTORY OF PRESENT ILLNESS: The patient developed pain in her right breast and brought it to her primary care physician's attention. Mammography at [facility] 03/10/2011 found the breasts to be heterogeneously dense. There was a mildly irregular mass posteriorly in the upper outer quadrant of the right breast. On ultrasound this was hypoechoic and measured approximately 2.4 cm, with irregular margins. The left breast was unremarkable.  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 Bilateral breast MRIs were obtained 03/18/2011, showing in the upper outer quadrant of the right breast an irregular mass measuring 5.5 cm. There was a suggestion of cortical thickening in the level I right axillary lymph node, which measured 1.2 cm. The patient was staged with chest CT scan and PET scan, which showed no distant disease. These studies did confirm the abnormality in the right breast as well as a hypermetabolic right axillary lymph node, making this a clinical T3 N1 or stage III invasive ductal carcinoma.  She completed neoadjuvant chemotherapy and proceeded to surgery and subsequent treatment as detailed below..  INTERVAL HISTORY: [name] returns alone today for followup of her right breast cancer. She currently being treated with anastrozole, and is receiving goserelin injections every 3 months. Her most recent injection was one week ago, 06/17/2013.   But admits that she was off of the anastrozole for approximately 3 weeks. She tells me she is paying over $100 for 30 days of the medication. We discussed the fact that it should  not be as expensive since it is a generic, and I gave her some suggestions of places to call for estimates.  Otherwise, interval history is notable for the fact that [name] and her husband separated over the summer. She tells me this was a positive move, but has been somewhat stressful. She is back to work full-time at [employer]  She is working hard, and is trying to eat a better diet. She is pleased to hear that she has lost approximately 20 pounds since February.  REVIEW OF SYSTEMS: [name] has had no recent illnesses denies any fevers, chills, or night sweats. She does have occasional hot flashes. She's had no abnormal bleeding, and also denies any signs of vaginal bleeding. She does complain of fatigue. Her appetite is good, and she's had no nausea or change in bowel or bladder habits. She denies any new cough, phlegm production, shortness of breath, chest pain, or palpitations. She tends to have some swelling in her feet and ankles, and this improves with elevation. She denies any new or unusual myalgias, arthralgias, or bony pain. She still has some occasional cramping and "muscle spasms" in her back, but these are stable. She's had no abnormal headaches and denies any dizziness or change in vision.  A detailed review of systems today was otherwise noncontributory.  PAST MEDICAL HISTORY: Past Medical History  Diagnosis Date  . Back pain     lumbar   . Anemia   . GERD (gastroesophageal reflux disease)   . Status post chemotherapy     finished 08/2011  . H/O scoliosis   . Peripheral neuropathy  s/p chemotherapy  . Hypertension     under control with med., has been on med. x 5 yr.  . Depression     no current med.  Marland Kitchen Headache(784.0)     hx. migraine in childhood  . Breast cancer 2012    right  . Gastric ulcer   . Cough 09/07/2012  . S/P radiation therapy 12/19/11 - 02/03/12    Right Chest Wall and Axilla  . Diabetes mellitus without complication     PAST SURGICAL  HISTORY: Past Surgical History  Procedure Laterality Date  . Hip pinning  age 25    bilateral  . Portacath placement  04/08/2011  . Mastectomy w/ sentinel node biopsy  10/14/2011    Procedure: MASTECTOMY WITH SENTINEL LYMPH NODE BIOPSY;  Surgeon: Robyne Askew, MD;  Location: [facility] OR;  Service: General;  Laterality: Right;  . Wound exploration  10/14/2011    right mastectomy wound  . Esophagogastroduodenoscopy (egd) with propofol  09/06/2012    Procedure: ESOPHAGOGASTRODUODENOSCOPY (EGD) WITH PROPOFOL;  Surgeon: Rachael Fee, MD;  Location: [facility];  Service: Endoscopy;  Laterality: N/A;  . Port-a-cath removal  09/12/2012    Procedure: REMOVAL PORT-A-CATH;  Surgeon: Robyne Askew, MD;  Location: [facility];  Service: General;  Laterality: Left;    FAMILY HISTORY Family History  Problem Relation Age of Onset  . Heart disease Father     heart attack  . Heart attack Father     died age 44  . Hypertension Father   . Hypertension Mother   . Anemia Mother   . Diabetes Mother   . Hypertension Brother   . Diabetes Brother   . Heart disease Maternal Grandmother     MI  . Hypertension Maternal Grandmother   . Diabetes Maternal Grandmother   . Hypertension Paternal Grandmother   . Stroke Mother   . Aneurysm Mother     brain  . Diabetes Paternal Grandmother   [name] died from a stroke at age 83. The patient's father died from a myocardial infarction at the age of 65. She has one sister and one brother. There is no history of breast or ovarian cancer in the family.   GYNECOLOGIC HISTORY: Menarche age 13. She was premenopausal at the time of diagnosis. She has never used birth control pills. She is GX P2. First pregnancy to term age 2. She had her last period at the time of her surgery.  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 She is currently separtated from her husband, Amarria Andreasen, who works in  Holiday representative. Daughters, [place], 17, and [name], [place], are both in high school and both live with the patient.    ADVANCED DIRECTIVES:  HEALTH MAINTENANCE: (Updated 06/25/2013) History  Substance Use Topics  . Smoking status: Former Games developer  . Smokeless tobacco: Never Used     Comment: quit 1998 - states only smoked for 1 month  . Alcohol Use: Yes     Comment: rarely     Colonoscopy: Never  PAP: Not on file  Bone density: Scheduled for July 2015  Lipid panel: Dr. Hal Hope    Allergies  Allergen Reactions  . Oxycodone Other (See Comments)    headache  . Tape Other (See Comments)    SKIN TEARS    Current Outpatient Prescriptions  Medication Sig Dispense Refill  . anastrozole (ARIMIDEX) 1 MG tablet Take 1 tablet (1 mg total) by mouth  daily.  30 tablet  12  . B Complex-C (SUPER B COMPLEX PO) Take 1 tablet by mouth daily.      . benazepril-hydrochlorthiazide (LOTENSIN HCT) 20-12.5 MG per tablet Take 1 tablet by mouth 2 (two) times daily.      . ferrous sulfate (FERRO-BOB) 325 (65 FE) MG tablet Take 325 mg by mouth daily with breakfast.      . goserelin (ZOLADEX) 10.8 MG injection Inject 10.8 mg into the skin every 3 (three) months.      Marland Kitchen KLOR-CON M20 20 MEQ tablet TAKE ONE TABLET BY MOUTH DAILY  30 tablet  2  . metFORMIN (GLUCOPHAGE) 500 MG tablet Take 500 mg by mouth 2 (two) times daily with a meal.      . naproxen sodium (ANAPROX) 220 MG tablet Take 220 mg by mouth 2 (two) times daily with a meal.      . pantoprazole (PROTONIX) 40 MG tablet TAKE ONE TABLET BY MOUTH EVERY DAY  30 tablet  0  . ranitidine (ZANTAC) 75 MG tablet Take 75 mg by mouth as needed. ONLY IF OUT OF PROTONIX       No current facility-administered medications for this visit.    OBJECTIVE: Young [place] American woman who appears comfortable and is in no acute distress Filed Vitals:   06/25/13 1440  BP: 140/85  Pulse: 87  Temp: 98.4 F (36.9 C)  Resp: 18     Body mass index is 46.66 kg/(m^2).     ECOG FS: 1 Filed Weights   06/25/13 1440  Weight: 298 lb (135.172 kg)   Physical Exam: HEENT:  Sclerae anicteric.  Oropharynx clear. Buccal mucosa is pink and moist NODES:  No cervical or supraclavicular lymphadenopathy palpated.  BREAST EXAM:  Status post right mastectomy. No suspicious nodularities or skin changes, and no evidence of local recurrence. Left breast is unremarkable. Axillae are benign bilaterally with no palpable lymphadenopathy. LUNGS:  Clear to auscultation bilaterally.  No wheezes or rhonchi HEART:  Regular rate and rhythm. No murmur  ABDOMEN:  Soft, obese, nontender.  Positive bowel sounds.  MSK:  No focal spinal tenderness to palpation. Good range of motion in the upper extremities bilaterally. EXTREMITIES: Nonpitting pedal edema, equal bilaterally. No upper extremity edema noted. NEURO:  Nonfocal. Well oriented.  Appropriate affect.       LAB RESULTS: Lab Results  Component Value Date   WBC 6.7 06/17/2013   NEUTROABS 4.1 06/17/2013   HGB 11.1* 06/17/2013   HCT 34.9 06/17/2013   MCV 79.2* 06/17/2013   PLT 346 06/17/2013      Chemistry      Component Value Date/Time   NA 142 06/17/2013 1134   NA 142 09/12/2012 1154   K 3.5 06/17/2013 1134   K 3.5 09/12/2012 1154   CL 101 12/21/2012 1019   CL 99 09/12/2012 1154   CO2 27 06/17/2013 1134   CO2 30 04/04/2012 1456   BUN 11.5 06/17/2013 1134   BUN 11 09/12/2012 1154   CREATININE 0.8 06/17/2013 1134   CREATININE 0.80 09/12/2012 1154      Component Value Date/Time   CALCIUM 10.0 06/17/2013 1134   CALCIUM 9.8 04/04/2012 1456   ALKPHOS 94 06/17/2013 1134   ALKPHOS 113 04/04/2012 1456   AST 24 06/17/2013 1134   AST 26 04/04/2012 1456   ALT 22 06/17/2013 1134   ALT 24 04/04/2012 1456   BILITOT 0.67 06/17/2013 1134   BILITOT 0.4 04/04/2012 1456     FSH  7.1  06/17/2013  LH  <0.1  06/17/2013  Estradiol     3  06/17/2013    STUDIES:  Most recent left mammogram at [facility] on 03/14/2013 was  unremarkable.  Patient has never had a bone density, and is being scheduled for a baseline study in July 2015.    ASSESSMENT: 37 y.o.  BRCA 1-2 negative [place] woman   (1) status post right breast upper outer quadrant biopsy 03-22-2011 for a clinical T3 N1 (stage III) invasive ductal carcinoma, grade 3, 99% estrogen and  100% progesterone receptor positive, HER-2 negative, with an MIB-1 of 98%.   (2) Status post 4 cycles of dose dense doxorubicin and cyclophosphamide, followed by weekly paclitaxel x 8, completed 08/25/2011  (3) s/p right mastectomy and sentinel lymph node sampling 10/14/2011 for a residual ypT2 ypN1(mic) invasive ductal carcinoma, grade 3, with ample margins  (4) status post radiation therapy under the care of Dr. Roselind Messier,  completed in mid June.  (5)  Anti-estrogen therapy started 01/18/2012 with first Zoladex injection,  continued every 3 months. Anastrozole started in late June 2013, the goal being to continue for at least 5 years (June 2018).  (6) remote history of DVT  PLAN:  Lonna continues to do well with regards to her breast cancer, with no clinical evidence of disease recurrence. I'm making no change in her current plan, and she will continue on anastrozole with q. 3 month injections of goserelin. The plan is to continue this regimen for at least 5 years, until June of 2018, then reevaluate. I did give her a one-year refill on the anastrozole since that she may get estimates from different pharmacies and finds that medication for a cheaper price.  Briyonna will return in 12 weeks which will be January 19. She will have labs, physical exam, and her next goserelin injection that day. She is due for her next left mammogram next July, and I am also adding orders for a bone density study to be obtained at the same time.  She voices understanding and agreement with this plan, and knows to call with any changes or problems.   Kaylee Trivett PA-C    06/25/2013

## 2013-06-25 NOTE — Telephone Encounter (Signed)
Call from Randall An w/[facility]: Has not been able to reach patient and is not able to send her a check until she speaks with her. Contact # she has for her is disconnected. Made her aware she is scheduled to come to office today-will give her contact # for [facility] to call today. Huntley Dec requests our office to call her if patient does not come in today.

## 2013-07-19 ENCOUNTER — Other Ambulatory Visit: Payer: Self-pay | Admitting: Oncology

## 2013-08-26 ENCOUNTER — Other Ambulatory Visit: Payer: Self-pay | Admitting: *Deleted

## 2013-08-26 DIAGNOSIS — C50919 Malignant neoplasm of unspecified site of unspecified female breast: Secondary | ICD-10-CM

## 2013-08-26 MED ORDER — ANASTROZOLE 1 MG PO TABS: ORAL_TABLET | ORAL | Status: DC

## 2013-09-09 ENCOUNTER — Ambulatory Visit (HOSPITAL_BASED_OUTPATIENT_CLINIC_OR_DEPARTMENT_OTHER): Payer: Medicaid Other | Admitting: Oncology

## 2013-09-09 ENCOUNTER — Encounter (INDEPENDENT_AMBULATORY_CARE_PROVIDER_SITE_OTHER): Payer: Self-pay

## 2013-09-09 ENCOUNTER — Ambulatory Visit (HOSPITAL_BASED_OUTPATIENT_CLINIC_OR_DEPARTMENT_OTHER): Payer: Medicaid Other

## 2013-09-09 ENCOUNTER — Telehealth: Payer: Self-pay | Admitting: Oncology

## 2013-09-09 ENCOUNTER — Other Ambulatory Visit (HOSPITAL_BASED_OUTPATIENT_CLINIC_OR_DEPARTMENT_OTHER): Payer: Medicaid Other

## 2013-09-09 VITALS — BP 137/79 | HR 80 | Temp 98.8°F | Resp 20 | Ht 67.0 in | Wt 279.8 lb

## 2013-09-09 DIAGNOSIS — D509 Iron deficiency anemia, unspecified: Secondary | ICD-10-CM

## 2013-09-09 DIAGNOSIS — E119 Type 2 diabetes mellitus without complications: Secondary | ICD-10-CM

## 2013-09-09 DIAGNOSIS — I10 Essential (primary) hypertension: Secondary | ICD-10-CM

## 2013-09-09 DIAGNOSIS — C50419 Malignant neoplasm of upper-outer quadrant of unspecified female breast: Secondary | ICD-10-CM

## 2013-09-09 DIAGNOSIS — Z170 Estrogen receptor positive status [ER+]: Secondary | ICD-10-CM

## 2013-09-09 DIAGNOSIS — C50911 Malignant neoplasm of unspecified site of right female breast: Secondary | ICD-10-CM

## 2013-09-09 DIAGNOSIS — Z86718 Personal history of other venous thrombosis and embolism: Secondary | ICD-10-CM

## 2013-09-09 DIAGNOSIS — Z5111 Encounter for antineoplastic chemotherapy: Secondary | ICD-10-CM

## 2013-09-09 DIAGNOSIS — C50411 Malignant neoplasm of upper-outer quadrant of right female breast: Secondary | ICD-10-CM | POA: Insufficient documentation

## 2013-09-09 DIAGNOSIS — G629 Polyneuropathy, unspecified: Secondary | ICD-10-CM

## 2013-09-09 LAB — CBC WITH DIFFERENTIAL/PLATELET
BASO%: 0.6 % (ref 0.0–2.0)
BASOS ABS: 0 10*3/uL (ref 0.0–0.1)
EOS ABS: 0.3 10*3/uL (ref 0.0–0.5)
EOS%: 4.7 % (ref 0.0–7.0)
HCT: 36.6 % (ref 34.8–46.6)
HEMOGLOBIN: 11.7 g/dL (ref 11.6–15.9)
LYMPH%: 20.7 % (ref 14.0–49.7)
MCH: 25.2 pg (ref 25.1–34.0)
MCHC: 32 g/dL (ref 31.5–36.0)
MCV: 78.8 fL — AB (ref 79.5–101.0)
MONO#: 0.7 10*3/uL (ref 0.1–0.9)
MONO%: 9.8 % (ref 0.0–14.0)
NEUT#: 4.4 10*3/uL (ref 1.5–6.5)
NEUT%: 64.2 % (ref 38.4–76.8)
Platelets: 365 10*3/uL (ref 145–400)
RBC: 4.65 10*6/uL (ref 3.70–5.45)
RDW: 14 % (ref 11.2–14.5)
WBC: 6.8 10*3/uL (ref 3.9–10.3)
lymph#: 1.4 10*3/uL (ref 0.9–3.3)

## 2013-09-09 LAB — COMPREHENSIVE METABOLIC PANEL (CC13)
ALT: 18 U/L (ref 0–55)
AST: 19 U/L (ref 5–34)
Albumin: 3.6 g/dL (ref 3.5–5.0)
Alkaline Phosphatase: 97 U/L (ref 40–150)
Anion Gap: 11 mEq/L (ref 3–11)
BILIRUBIN TOTAL: 0.33 mg/dL (ref 0.20–1.20)
BUN: 12.4 mg/dL (ref 7.0–26.0)
CO2: 29 mEq/L (ref 22–29)
CREATININE: 0.8 mg/dL (ref 0.6–1.1)
Calcium: 9.7 mg/dL (ref 8.4–10.4)
Chloride: 103 mEq/L (ref 98–109)
GLUCOSE: 123 mg/dL (ref 70–140)
Potassium: 3.6 mEq/L (ref 3.5–5.1)
Sodium: 143 mEq/L (ref 136–145)
Total Protein: 7.5 g/dL (ref 6.4–8.3)

## 2013-09-09 MED ORDER — GOSERELIN ACETATE 10.8 MG ~~LOC~~ IMPL
10.8000 mg | DRUG_IMPLANT | SUBCUTANEOUS | Status: DC
  Administered 2013-09-09: 10.8 mg via SUBCUTANEOUS
  Filled 2013-09-09: qty 10.8

## 2013-09-09 NOTE — Progress Notes (Signed)
ID: [name]   DOB: Oct 21, 1975  MR#: [id number]  AJG#:[id number]  PCP: Hayden Rasmussen., MD GYN: SUAutumn Messing, MD [place]:  Gery Pray, MD OTHER MD:  Owens Loffler, MD   CHIEF COMPLAINT:  Right Breast Cancer   HISTORY OF PRESENT ILLNESS: The patient developed pain in her right breast and brought it to her primary care physician's attention. Mammography at [facility] 03/10/2011 found the breasts to be heterogeneously dense. There was a mildly irregular mass posteriorly in the upper outer quadrant of the right breast. On ultrasound this was hypoechoic and measured approximately 2.4 cm, with irregular margins. The left breast was unremarkable.  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 Bilateral breast MRIs were obtained 03/18/2011, showing in the upper outer quadrant of the right breast an irregular mass measuring 5.5 cm. There was a suggestion of cortical thickening in the level I right axillary lymph node, which measured 1.2 cm. The patient was staged with chest CT scan and PET scan, which showed no distant disease. These studies did confirm the abnormality in the right breast as well as a hypermetabolic right axillary lymph node, making this a clinical T3 N1 or stage III invasive ductal carcinoma.  She completed neoadjuvant chemotherapy and proceeded to surgery and subsequent treatment as detailed below..  INTERVAL HISTORY: [name] returns today for followup of her right breast cancer accompanied by one of her daughters. [name] is being treated with anastrozole, while receiving goserelin injections every 3 months. The main problems she is having from the treatment are okay show hot flashes and some vaginal dryness.  REVIEW OF SYSTEMS: [name] had an episode where she was lying in bed quietly and developed a cramp in her  right upper chest. This went away after a few minutes with no intervention. She also had a cramp in her upper back right leg. She does have pain and soreness in the left breast, which is not more intense or persistent than before. She complains of being forgetful but not depressed. A detailed review of systems today was otherwise stable  PAST MEDICAL HISTORY: Past Medical History  Diagnosis Date  . Back pain     lumbar   . Anemia   . GERD (gastroesophageal reflux disease)   . Status post chemotherapy     finished 08/2011  . H/O scoliosis   . Peripheral neuropathy     s/p chemotherapy  . Hypertension     under control with med., has been on med. x 5 yr.  . Depression     no current med.  Marland Kitchen Headache(784.0)     hx. migraine in childhood  . Breast cancer 2012    right  . Gastric ulcer   . Cough 09/07/2012  . S/P radiation therapy 12/19/11 - 02/03/12    Right Chest Wall and Axilla  . Diabetes mellitus without complication     PAST SURGICAL HISTORY: Past Surgical History  Procedure Laterality Date  . Hip pinning  age 78    bilateral  . Portacath placement  04/08/2011  . Mastectomy w/ sentinel node biopsy  10/14/2011    Procedure: MASTECTOMY WITH SENTINEL LYMPH NODE BIOPSY;  Surgeon: Merrie Roof, MD;  Location: [place];  Service: General;  Laterality: Right;  . Wound exploration  10/14/2011    right mastectomy wound  . Esophagogastroduodenoscopy (egd) with propofol  09/06/2012    Procedure: ESOPHAGOGASTRODUODENOSCOPY (EGD) WITH PROPOFOL;  Surgeon: Milus Banister, MD;  Location: [facility];  Service: Endoscopy;  Laterality: N/A;  . Port-a-cath removal  09/12/2012    Procedure: REMOVAL PORT-A-CATH;  Surgeon: Merrie Roof, MD;  Location: [place];  Service: General;  Laterality: Left;    FAMILY HISTORY Family History  Problem Relation Age of Onset  . Heart disease Father     heart attack  . Heart attack Father     died age 76  . Hypertension Father   .  Hypertension Mother   . Anemia Mother   . Diabetes Mother   . Hypertension Brother   . Diabetes Brother   . Heart disease Maternal Grandmother     MI  . Hypertension Maternal Grandmother   . Diabetes Maternal Grandmother   . Hypertension Paternal Grandmother   . Stroke Mother   . Aneurysm Mother     brain  . Diabetes Paternal Grandmother   [name] died from a stroke at age 63. The patient's father died from a myocardial infarction at the age of 68. She has one sister and one brother. There is no history of breast or ovarian cancer in the family.   GYNECOLOGIC HISTORY: Menarche age 62. She was premenopausal at the time of diagnosis. She has never used birth control pills. She is GX P2. First pregnancy to term age 65. She had her last period at the time of her surgery.  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 She is currently separtated from her husband, [name], who works in Architect. Daughters, [place], 17, and [place], [place], are both in high school and both live with the patient.    ADVANCED DIRECTIVES:  HEALTH MAINTENANCE: (Updated 06/25/2013) History  Substance Use Topics  . Smoking status: Former Research scientist (life sciences)  . Smokeless tobacco: Never Used     Comment: quit 1998 - states only smoked for 1 month  . Alcohol Use: Yes     Comment: rarely     Colonoscopy: Never  PAP: Not on file  Bone density: Scheduled for July 2015  Lipid panel: Dr. Darron Doom    Allergies  Allergen Reactions  . Oxycodone Other (See Comments)    headache  . Tape Other (See Comments)    SKIN TEARS    Current Outpatient Prescriptions  Medication Sig Dispense Refill  . anastrozole (ARIMIDEX) 1 MG tablet TAKE ONE TABLET BY MOUTH ONCE DAILY  30 tablet  11  . B Complex-C (SUPER B COMPLEX PO) Take 1 tablet by mouth daily.      . benazepril-hydrochlorthiazide (LOTENSIN HCT) 20-12.5 MG per tablet Take 1 tablet by mouth 2 (two) times daily.       . ferrous sulfate (FERRO-BOB) 325 (65 FE) MG tablet Take 325 mg by mouth daily with breakfast.      . goserelin (ZOLADEX) 10.8 MG injection Inject 10.8 mg into the skin every 3 (three) months.      Marland Kitchen KLOR-CON M20 20 MEQ tablet TAKE ONE TABLET BY MOUTH DAILY  30 tablet  2  . metFORMIN (GLUCOPHAGE) 500 MG tablet Take 500 mg by mouth 2 (two) times daily with a meal.      . naproxen sodium (ANAPROX) 220 MG tablet Take 220 mg by mouth 2 (two) times daily with a meal.      . pantoprazole (PROTONIX) 40 MG tablet TAKE ONE TABLET BY MOUTH EVERY DAY  30 tablet  0  . ranitidine (ZANTAC) 75 MG tablet Take 75 mg by mouth as needed. ONLY IF OUT OF PROTONIX       No current facility-administered medications for this visit.    OBJECTIVE: Young African American woman in no acute distress Filed Vitals:   09/09/13 1441  BP: 137/79  Pulse: 80  Temp: 98.8 F (37.1 C)  Resp: 20     Body mass index is 43.81 kg/(m^2).    ECOG FS: 1 Filed Weights   09/09/13 1441  Weight: 279 lb 12.8 oz (126.916 kg)   Sclerae unicteric, pupils equal and reactive Oropharynx clear and moist-- no thrush No cervical or supraclavicular adenopathy Lungs no rales or rhonchi Heart regular rate and rhythm Abd soft, nontender, positive bowel sounds MSK no focal spinal tenderness, no upper extremity lymphedema Neuro: nonfocal, well oriented, appropriate affect Breasts: The right breast is status post lumpectomy and radiation. There is no evidence of local recurrence. The left breast is unremarkable. Both axillae are benign.   LAB RESULTS: Lab Results  Component Value Date   WBC 6.8 09/09/2013   NEUTROABS 4.4 09/09/2013   HGB 11.7 09/09/2013   HCT 36.6 09/09/2013   MCV 78.8* 09/09/2013   PLT 365 09/09/2013      Chemistry      Component Value Date/Time   NA 143 09/09/2013 1422   NA 142 09/12/2012 1154   K 3.6 09/09/2013 1422   K 3.5 09/12/2012 1154   CL 101 12/21/2012 1019   CL 99 09/12/2012 1154   CO2 29 09/09/2013 1422   CO2  30 04/04/2012 1456   BUN 12.4 09/09/2013 1422   BUN 11 09/12/2012 1154   CREATININE 0.8 09/09/2013 1422   CREATININE 0.80 09/12/2012 1154      Component Value Date/Time   CALCIUM 9.7 09/09/2013 1422   CALCIUM 9.8 04/04/2012 1456   ALKPHOS 97 09/09/2013 1422   ALKPHOS 113 04/04/2012 1456   AST 19 09/09/2013 1422   AST 26 04/04/2012 1456   ALT 18 09/09/2013 1422   ALT 24 04/04/2012 1456   BILITOT 0.33 09/09/2013 1422   BILITOT 0.4 04/04/2012 1456       STUDIES:  Most recent left mammogram at [facility] on 03/14/2013 was unremarkable.  Patient has never had a bone density, and is being scheduled for a baseline study in July 2015.    ASSESSMENT: 38 y.o.  BRCA 1-2 negative [place] woman   (1) status post right breast upper outer quadrant biopsy July of 2012 for a clinical T3 N1 (stage III) invasive ductal carcinoma, grade 3, 99% estrogen and  100% progesterone receptor positive, HER-2 negative, with an MIB-1 of 98%.   (2) Status post 4 cycles of dose dense doxorubicin and cyclophosphamide, followed by weekly paclitaxel x 8, completed 08/25/2011  (3) s/p right mastectomy and sentinel lymph node sampling 10/14/2011 for a residual ypT2 ypN1(mic) invasive ductal carcinoma, grade 3, with ample margins  (4) status post radiation therapy under the care of Dr. Sondra Come,  completed in mid June.  (5)  Anti-estrogen therapy started 01/18/2012 with first Zoladex injection,  continued every 3 months. Anastrozole started in late June 2013, the goal being to continue for at least 5 years (June 2018).  (6) remote history of DVT  PLAN:  Khalani is doing well from a breast cancer point of view. There is no evidence of disease recurrence. We are going to continue her goserelin and anastrozole to total 5 years. She will have a  bone density with her next mammogram at [facility] in July. She'll see [place] in August.  She knows to call for any problems that may develop before next visit here.   Chauncey Cruel, MD      09/09/2013

## 2013-12-09 ENCOUNTER — Other Ambulatory Visit: Payer: Self-pay | Admitting: Oncology

## 2013-12-09 ENCOUNTER — Ambulatory Visit (HOSPITAL_BASED_OUTPATIENT_CLINIC_OR_DEPARTMENT_OTHER): Payer: BC Managed Care – PPO

## 2013-12-09 VITALS — BP 123/82 | HR 78 | Temp 98.8°F

## 2013-12-09 DIAGNOSIS — Z5111 Encounter for antineoplastic chemotherapy: Secondary | ICD-10-CM

## 2013-12-09 DIAGNOSIS — Z86718 Personal history of other venous thrombosis and embolism: Secondary | ICD-10-CM

## 2013-12-09 DIAGNOSIS — C50419 Malignant neoplasm of upper-outer quadrant of unspecified female breast: Secondary | ICD-10-CM

## 2013-12-09 MED ORDER — GOSERELIN ACETATE 10.8 MG ~~LOC~~ IMPL
10.8000 mg | DRUG_IMPLANT | Freq: Once | SUBCUTANEOUS | Status: AC
  Administered 2013-12-09: 10.8 mg via SUBCUTANEOUS
  Filled 2013-12-09: qty 10.8

## 2013-12-09 NOTE — Patient Instructions (Signed)
Goserelin injection What is this medicine? GOSERELIN (GOE se rel in) is similar to a hormone found in the body. It lowers the amount of sex hormones that the body makes. Men will have lower testosterone levels and women will have lower estrogen levels while taking this medicine. In men, this medicine is used to treat prostate cancer; the injection is either given once per month or once every 12 weeks. A once per month injection (only) is used to treat women with endometriosis, dysfunctional uterine bleeding, or advanced breast cancer. This medicine may be used for other purposes; ask your health care provider or pharmacist if you have questions. COMMON BRAND NAME(S): Zoladex What should I tell my health care provider before I take this medicine? They need to know if you have any of these conditions (some only apply to women): -diabetes -heart disease or previous heart attack -high blood pressure -high cholesterol -kidney disease -osteoporosis or low bone density -problems passing urine -spinal cord injury -stroke -tobacco smoker -an unusual or allergic reaction to goserelin, hormone therapy, other medicines, foods, dyes, or preservatives -pregnant or trying to get pregnant -breast-feeding How should I use this medicine? This medicine is for injection under the skin. It is given by a health care professional in a hospital or clinic setting. Men receive this injection once every 4 weeks or once every 12 weeks. Women will only receive the once every 4 weeks injection. Talk to your pediatrician regarding the use of this medicine in children. Special care may be needed. Overdosage: If you think you have taken too much of this medicine contact a poison control center or emergency room at once. NOTE: This medicine is only for you. Do not share this medicine with others. What if I miss a dose? It is important not to miss your dose. Call your doctor or health care professional if you are unable to  keep an appointment. What may interact with this medicine? -female hormones like estrogen -herbal or dietary supplements like black cohosh, chasteberry, or DHEA -female hormones like testosterone -prasterone This list may not describe all possible interactions. Give your health care provider a list of all the medicines, herbs, non-prescription drugs, or dietary supplements you use. Also tell them if you smoke, drink alcohol, or use illegal drugs. Some items may interact with your medicine. What should I watch for while using this medicine? Visit your doctor or health care professional for regular checks on your progress. Your symptoms may appear to get worse during the first weeks of this therapy. Tell your doctor or healthcare professional if your symptoms do not start to get better or if they get worse after this time. Your bones may get weaker if you take this medicine for a long time. If you smoke or frequently drink alcohol you may increase your risk of bone loss. A family history of osteoporosis, chronic use of drugs for seizures (convulsions), or corticosteroids can also increase your risk of bone loss. Talk to your doctor about how to keep your bones strong. This medicine should stop regular monthly menstration in women. Tell your doctor if you continue to menstrate. Women should not become pregnant while taking this medicine or for 12 weeks after stopping this medicine. Women should inform their doctor if they wish to become pregnant or think they might be pregnant. There is a potential for serious side effects to an unborn child. Talk to your health care professional or pharmacist for more information. Do not breast-feed an infant while taking   this medicine. Men should inform their doctors if they wish to father a child. This medicine may lower sperm counts. Talk to your health care professional or pharmacist for more information. What side effects may I notice from receiving this  medicine? Side effects that you should report to your doctor or health care professional as soon as possible: -allergic reactions like skin rash, itching or hives, swelling of the face, lips, or tongue -bone pain -breathing problems -changes in vision -chest pain -feeling faint or lightheaded, falls -fever, chills -pain, swelling, warmth in the leg -pain, tingling, numbness in the hands or feet -swelling of the ankles, feet, hands -trouble passing urine or change in the amount of urine -unusually high or low blood pressure -unusually weak or tired Side effects that usually do not require medical attention (report to your doctor or health care professional if they continue or are bothersome): -change in sex drive or performance -changes in breast size in both males and females -changes in emotions or moods -headache -hot flashes -irritation at site where injected -loss of appetite -skin problems like acne, dry skin -vaginal dryness This list may not describe all possible side effects. Call your doctor for medical advice about side effects. You may report side effects to FDA at 1-800-FDA-1088. Where should I keep my medicine? This drug is given in a hospital or clinic and will not be stored at home. NOTE: This sheet is a summary. It may not cover all possible information. If you have questions about this medicine, talk to your doctor, pharmacist, or health care provider.  2014, Elsevier/Gold Standard. (2008-12-23 13:28:29)  

## 2014-03-03 ENCOUNTER — Encounter (HOSPITAL_COMMUNITY): Payer: Self-pay | Admitting: Emergency Medicine

## 2014-03-03 ENCOUNTER — Emergency Department (INDEPENDENT_AMBULATORY_CARE_PROVIDER_SITE_OTHER)
Admission: EM | Admit: 2014-03-03 | Discharge: 2014-03-03 | Disposition: A | Discharge status: Home / Self Care | Payer: BC Managed Care – PPO | Source: Home / Self Care | Attending: Family Medicine | Admitting: Family Medicine

## 2014-03-03 DIAGNOSIS — I10 Essential (primary) hypertension: Secondary | ICD-10-CM

## 2014-03-03 LAB — POCT I-STAT, CHEM 8
BUN: 12 mg/dL (ref 6–23)
Calcium, Ion: 1.21 mmol/L (ref 1.12–1.23)
Chloride: 102 meq/L (ref 96–112)
Creatinine, Ser: 0.6 mg/dL (ref 0.50–1.10)
Glucose, Bld: 124 mg/dL — ABNORMAL HIGH (ref 70–99)
HCT: 43 % (ref 36.0–46.0)
Hemoglobin: 14.6 g/dL (ref 12.0–15.0)
Potassium: 3.7 meq/L (ref 3.7–5.3)
Sodium: 142 meq/L (ref 137–147)
TCO2: 28 mmol/L (ref 0–100)

## 2014-03-03 MED ORDER — AMLODIPINE BESYLATE 5 MG PO TABS: 5.0000 mg | ORAL_TABLET | Freq: Every day | ORAL | Status: DC

## 2014-03-03 MED ORDER — BENAZEPRIL-HYDROCHLOROTHIAZIDE 20-12.5 MG PO TABS: 1.0000 | ORAL_TABLET | Freq: Every day | ORAL | Status: DC

## 2014-03-03 NOTE — Discharge Instructions (Signed)
Take medicine daily and see your doctor in 2 weeks for bp recheck.

## 2014-03-03 NOTE — ED Notes (Signed)
C/o 5 day duration of HA, not relieved w OTC medications. Has 38 yr old daughter with her , who is driving today

## 2014-03-03 NOTE — ED Provider Notes (Signed)
CSN: [id number]     Arrival date & time 03/03/14  1354 History   First MD Initiated Contact with Patient 03/03/14 1443     Chief Complaint  Patient presents with  . Headache   (Consider location/radiation/quality/duration/timing/severity/associated sxs/prior Treatment) Patient is a 38 y.o. female presenting with headaches. The history is provided by the patient.  Headache Pain location:  Generalized Quality:  Dull Onset quality:  Gradual Duration:  5 days Progression:  Unchanged Chronicity:  New Similar to prior headaches: yes   Context comment:  No bp meds for 59mo. , ran out., no neuro sx. Relieved by:  None tried Worsened by:  Nothing tried Ineffective treatments:  None tried Associated symptoms: no abdominal pain, no blurred vision, no fever, no nausea, no neck pain, no photophobia and no vomiting     Past Medical History  Diagnosis Date  . Back pain     lumbar   . Anemia   . GERD (gastroesophageal reflux disease)   . Status post chemotherapy     finished 08/2011  . H/O scoliosis   . Peripheral neuropathy     s/p chemotherapy  . Hypertension     under control with med., has been on med. x 5 yr.  . Depression     no current med.  Marland Kitchen Headache(784.0)     hx. migraine in childhood  . Breast cancer 2012    right  . Gastric ulcer   . Cough 09/07/2012  . S/P radiation therapy 12/19/11 - 02/03/12    Right Chest Wall and Axilla  . Diabetes mellitus without complication    Past Surgical History  Procedure Laterality Date  . Hip pinning  age 25    bilateral  . Portacath placement  04/08/2011  . Mastectomy w/ sentinel node biopsy  10/14/2011    Procedure: MASTECTOMY WITH SENTINEL LYMPH NODE BIOPSY;  Surgeon: Merrie Roof, MD;  Location: [place];  Service: General;  Laterality: Right;  . Wound exploration  10/14/2011    right mastectomy wound  . Esophagogastroduodenoscopy (egd) with propofol  09/06/2012    Procedure: ESOPHAGOGASTRODUODENOSCOPY (EGD) WITH PROPOFOL;  Surgeon:  Milus Banister, MD;  [facility];  Service: Endoscopy;  Laterality: N/A;  . Port-a-cath removal  09/12/2012    Procedure: REMOVAL PORT-A-CATH;  Surgeon: Merrie Roof, MD;  Location: [place];  Service: General;  Laterality: Left;   Family History  Problem Relation Age of Onset  . Heart disease Father     heart attack  . Heart attack Father     died age 57  . Hypertension Father   . Hypertension Mother   . Anemia Mother   . Diabetes Mother   . Hypertension Brother   . Diabetes Brother   . Heart disease Maternal Grandmother     MI  . Hypertension Maternal Grandmother   . Diabetes Maternal Grandmother   . Hypertension Paternal Grandmother   . Stroke Mother   . Aneurysm Mother     brain  . Diabetes Paternal Grandmother    History  Substance Use Topics  . Smoking status: Former Research scientist (life sciences)  . Smokeless tobacco: Never Used     Comment: quit 1998 - states only smoked for 1 month  . Alcohol Use: Yes     Comment: rarely   OB History   Grav Para Term Preterm Abortions TAB SAB Ect Mult Living   5 2 1 1 3  3    2  Obstetric Comments   Menarche age 41 [place] P2  First pregnancy to term age 14 1 miscarriage No birth control pills     Review of Systems  Constitutional: Negative.  Negative for fever.  Eyes: Negative for blurred vision and photophobia.  Cardiovascular: Negative.   Gastrointestinal: Negative.  Negative for nausea, vomiting and abdominal pain.  Genitourinary: Negative.   Musculoskeletal: Negative for neck pain.  Neurological: Positive for headaches.    Allergies  Oxycodone and Tape  Home Medications   Prior to Admission medications   Medication Sig Start Date End Date Taking? Authorizing Provider  amLODipine (NORVASC) 5 MG tablet Take 1 tablet (5 mg total) by mouth daily. 03/03/14   Billy Fischer, MD  anastrozole (ARIMIDEX) 1 MG tablet TAKE ONE TABLET BY MOUTH ONCE DAILY 08/26/13   Amy Milda Smart, PA-C  B Complex-C (SUPER B COMPLEX PO)  Take 1 tablet by mouth daily.    Historical Provider, MD  benazepril-hydrochlorthiazide (LOTENSIN HCT) 20-12.5 MG per tablet Take 1 tablet by mouth 2 (two) times daily.    Historical Provider, MD  benazepril-hydrochlorthiazide (LOTENSIN HCT) 20-12.5 MG per tablet Take 1 tablet by mouth daily. 03/03/14   Billy Fischer, MD  ferrous sulfate (FERRO-BOB) 325 (65 FE) MG tablet Take 325 mg by mouth daily with breakfast.    Historical Provider, MD  goserelin (ZOLADEX) 10.8 MG injection Inject 10.8 mg into the skin every 3 (three) months. 01/18/12   Chauncey Cruel, MD  KLOR-CON M20 20 MEQ tablet TAKE ONE TABLET BY MOUTH DAILY 09/10/12   Amy Milda Smart, PA-C  metFORMIN (GLUCOPHAGE) 500 MG tablet Take 500 mg by mouth 2 (two) times daily with a meal.    Historical Provider, MD  naproxen sodium (ANAPROX) 220 MG tablet Take 220 mg by mouth 2 (two) times daily with a meal.    Historical Provider, MD  pantoprazole (PROTONIX) 40 MG tablet TAKE ONE TABLET BY MOUTH EVERY DAY 11/15/11   Amy Milda Smart, PA-C  ranitidine (ZANTAC) 75 MG tablet Take 75 mg by mouth as needed. ONLY IF OUT OF PROTONIX    Historical Provider, MD   BP 166/118  Pulse 78  Temp(Src) 98.7 F (37.1 C) (Oral)  Resp 20  SpO2 99% Physical Exam  Nursing note and vitals reviewed. Constitutional: She is oriented to person, place, and time. She appears well-developed and well-nourished. No distress.  HENT:  Head: Normocephalic.  Right Ear: External ear normal.  Left Ear: External ear normal.  Mouth/Throat: Oropharynx is clear and moist.  Eyes: Conjunctivae and EOM are normal. Pupils are equal, round, and reactive to light.  Neck: Normal range of motion. Neck supple.  Cardiovascular: Normal heart sounds.   Pulmonary/Chest: Breath sounds normal.  Lymphadenopathy:    She has no cervical adenopathy.  Neurological: She is alert and oriented to person, place, and time.  Skin: Skin is warm and dry.    ED Course  Procedures (including critical care  time) Labs Review Labs Reviewed  POCT I-STAT, CHEM 8 - Abnormal; Notable for the following:    Glucose, Bld 124 (*)    All other components within normal limits   i-stat bs 124 Imaging Review No results found.   MDM   1. Essential hypertension        Billy Fischer, MD 03/03/14 [phone]

## 2014-03-10 ENCOUNTER — Ambulatory Visit (HOSPITAL_BASED_OUTPATIENT_CLINIC_OR_DEPARTMENT_OTHER): Payer: BC Managed Care – PPO

## 2014-03-10 VITALS — BP 134/94 | HR 88 | Temp 98.5°F

## 2014-03-10 DIAGNOSIS — Z86718 Personal history of other venous thrombosis and embolism: Secondary | ICD-10-CM

## 2014-03-10 DIAGNOSIS — Z5111 Encounter for antineoplastic chemotherapy: Secondary | ICD-10-CM

## 2014-03-10 DIAGNOSIS — C50419 Malignant neoplasm of upper-outer quadrant of unspecified female breast: Secondary | ICD-10-CM

## 2014-03-10 MED ORDER — GOSERELIN ACETATE 10.8 MG ~~LOC~~ IMPL
10.8000 mg | DRUG_IMPLANT | Freq: Once | SUBCUTANEOUS | Status: AC
  Administered 2014-03-10: 10.8 mg via SUBCUTANEOUS
  Filled 2014-03-10: qty 10.8

## 2014-03-31 ENCOUNTER — Other Ambulatory Visit (HOSPITAL_BASED_OUTPATIENT_CLINIC_OR_DEPARTMENT_OTHER): Payer: BC Managed Care – PPO

## 2014-03-31 DIAGNOSIS — C50411 Malignant neoplasm of upper-outer quadrant of right female breast: Secondary | ICD-10-CM

## 2014-03-31 DIAGNOSIS — C50419 Malignant neoplasm of upper-outer quadrant of unspecified female breast: Secondary | ICD-10-CM

## 2014-03-31 DIAGNOSIS — G629 Polyneuropathy, unspecified: Secondary | ICD-10-CM

## 2014-03-31 DIAGNOSIS — D509 Iron deficiency anemia, unspecified: Secondary | ICD-10-CM

## 2014-03-31 DIAGNOSIS — Z86718 Personal history of other venous thrombosis and embolism: Secondary | ICD-10-CM

## 2014-03-31 LAB — CBC WITH DIFFERENTIAL/PLATELET
BASO%: 0.9 % (ref 0.0–2.0)
Basophils Absolute: 0.1 10*3/uL (ref 0.0–0.1)
EOS ABS: 0.4 10*3/uL (ref 0.0–0.5)
EOS%: 5.9 % (ref 0.0–7.0)
HCT: 38.1 % (ref 34.8–46.6)
HGB: 11.8 g/dL (ref 11.6–15.9)
LYMPH#: 1.3 10*3/uL (ref 0.9–3.3)
LYMPH%: 21.9 % (ref 14.0–49.7)
MCH: 23.9 pg — ABNORMAL LOW (ref 25.1–34.0)
MCHC: 30.9 g/dL — ABNORMAL LOW (ref 31.5–36.0)
MCV: 77.3 fL — ABNORMAL LOW (ref 79.5–101.0)
MONO#: 0.6 10*3/uL (ref 0.1–0.9)
MONO%: 9.5 % (ref 0.0–14.0)
NEUT%: 61.8 % (ref 38.4–76.8)
NEUTROS ABS: 3.7 10*3/uL (ref 1.5–6.5)
Platelets: 342 10*3/uL (ref 145–400)
RBC: 4.93 10*6/uL (ref 3.70–5.45)
RDW: 14 % (ref 11.2–14.5)
WBC: 6 10*3/uL (ref 3.9–10.3)

## 2014-03-31 LAB — COMPREHENSIVE METABOLIC PANEL
ALT: 13 U/L (ref 0–35)
AST: 18 U/L (ref 0–37)
Albumin: 4.1 g/dL (ref 3.5–5.2)
Alkaline Phosphatase: 103 U/L (ref 39–117)
BILIRUBIN TOTAL: 0.5 mg/dL (ref 0.2–1.2)
BUN: 11 mg/dL (ref 6–23)
CO2: 29 mEq/L (ref 19–32)
CREATININE: 0.82 mg/dL (ref 0.50–1.10)
Calcium: 9.6 mg/dL (ref 8.4–10.5)
Chloride: 104 mEq/L (ref 96–112)
Glucose, Bld: 98 mg/dL (ref 70–99)
Potassium: 3.6 mEq/L (ref 3.5–5.3)
Sodium: 142 mEq/L (ref 135–145)
TOTAL PROTEIN: 7.5 g/dL (ref 6.0–8.3)

## 2014-04-01 LAB — FOLLICLE STIMULATING HORMONE: FSH: 6.8 m[IU]/mL

## 2014-04-01 LAB — ESTRADIOL: ESTRADIOL: 13.8 pg/mL

## 2014-04-07 ENCOUNTER — Telehealth: Payer: Self-pay | Admitting: Nurse Practitioner

## 2014-04-07 ENCOUNTER — Other Ambulatory Visit: Payer: Self-pay | Admitting: *Deleted

## 2014-04-07 ENCOUNTER — Ambulatory Visit (HOSPITAL_BASED_OUTPATIENT_CLINIC_OR_DEPARTMENT_OTHER): Payer: BC Managed Care – PPO | Admitting: Nurse Practitioner

## 2014-04-07 ENCOUNTER — Encounter: Payer: Self-pay | Admitting: Nurse Practitioner

## 2014-04-07 VITALS — BP 136/91 | HR 74 | Temp 98.5°F | Resp 18 | Ht 67.0 in | Wt 274.3 lb

## 2014-04-07 DIAGNOSIS — C50419 Malignant neoplasm of upper-outer quadrant of unspecified female breast: Secondary | ICD-10-CM

## 2014-04-07 DIAGNOSIS — C50411 Malignant neoplasm of upper-outer quadrant of right female breast: Secondary | ICD-10-CM

## 2014-04-07 DIAGNOSIS — Z86718 Personal history of other venous thrombosis and embolism: Secondary | ICD-10-CM

## 2014-04-07 DIAGNOSIS — Z170 Estrogen receptor positive status [ER+]: Secondary | ICD-10-CM

## 2014-04-07 NOTE — Progress Notes (Signed)
ID: [name]   DOB: Jul 26, 1976  MR#: [id number]  QMV#:[id number]  PCP: Hayden Rasmussen., MD GYN: SUAutumn Messing, MD [place]:  Gery Pray, MD OTHER MD:  Owens Loffler, MD   CHIEF COMPLAINT:  Right Breast Cancer CURRENT THERAPY: anastrozole 1mg  daily  BREAST CANCER HISTORY: The patient developed pain in her right breast and brought it to her primary care physician's attention. Mammography at [facility] 03/10/2011 found the breasts to be heterogeneously dense. There was a mildly irregular mass posteriorly in the upper outer quadrant of the right breast. On ultrasound this was hypoechoic and measured approximately 2.4 cm, with irregular margins. The left breast was unremarkable.  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 Bilateral breast MRIs were obtained 03/18/2011, showing in the upper outer quadrant of the right breast an irregular mass measuring 5.5 cm. There was a suggestion of cortical thickening in the level I right axillary lymph node, which measured 1.2 cm. The patient was staged with chest CT scan and PET scan, which showed no distant disease. These studies did confirm the abnormality in the right breast as well as a hypermetabolic right axillary lymph node, making this a clinical T3 N1 or stage III invasive ductal carcinoma.  She completed neoadjuvant chemotherapy and proceeded to surgery and subsequent treatment as detailed below..  INTERVAL HISTORY: [name] returns today for followup of her right breast cancer accompanied by one of her daughters. [name] has been on anastrozole since June 1013 as well as goserelin injections every 3 months. She experiences about 3 hot flashes per week. She denies any vaginal changes. The interval history is significant for having stopped her metformin several months ago. It was causing  her diarrhea, though her PCP has informed her that this would not last forever. She did not like the care she was receiving there, and she has stopped seeing Dr. Darron Doom. She recently used an urgent care to prescribe BP meds. [name] is not exercising or following a diabetic diet but states that on the occasions she does check her blood sugars at home, they are not elevated.  REVIEW OF SYSTEMS: Kikuye complains of bilateral hand/finger pain early in the morning that resolves throughout the day and after "cracking" her knuckles. She also has random bilateral calf cramps, but knows she should stay better hydrated. Her lower legs and ankles swell at the end of a long day. A detailed review of systems was otherwise negative.   PAST MEDICAL HISTORY: Past Medical History  Diagnosis Date  . Back pain     lumbar   . Anemia   . GERD (gastroesophageal reflux disease)   . Status post chemotherapy     finished 08/2011  . H/O scoliosis   . Peripheral neuropathy     s/p chemotherapy  . Hypertension     under control with med., has been on med. x 5 yr.  . Depression     no current med.  Marland Kitchen Headache(784.0)     hx. migraine in childhood  . Breast cancer 2012    right  . Gastric ulcer   . Cough 09/07/2012  . S/P radiation therapy 12/19/11 - 02/03/12    Right Chest Wall and Axilla  . Diabetes mellitus without complication     PAST SURGICAL HISTORY: Past Surgical History  Procedure Laterality Date  . Hip pinning  age 39  bilateral  . Portacath placement  04/08/2011  . Mastectomy w/ sentinel node biopsy  10/14/2011    Procedure: MASTECTOMY WITH SENTINEL LYMPH NODE BIOPSY;  Surgeon: Merrie Roof, MD;  Location: [facility];  Service: General;  Laterality: Right;  . Wound exploration  10/14/2011    right mastectomy wound  . Esophagogastroduodenoscopy (egd) with propofol  09/06/2012    Procedure: ESOPHAGOGASTRODUODENOSCOPY (EGD) WITH PROPOFOL;  Surgeon: Milus Banister, MD;  Location: [facility];  Service:  Endoscopy;  Laterality: [name];  . Port-a-cath removal  09/12/2012    Procedure: REMOVAL PORT-A-CATH;  Surgeon: Merrie Roof, MD;  Location: [facility];  Service: General;  Laterality: Left;    FAMILY HISTORY Family History  Problem Relation Age of Onset  . Heart disease Father     heart attack  . Heart attack Father     died age 28  . Hypertension Father   . Hypertension Mother   . Anemia Mother   . Diabetes Mother   . Hypertension Brother   . Diabetes Brother   . Heart disease Maternal Grandmother     MI  . Hypertension Maternal Grandmother   . Diabetes Maternal Grandmother   . Hypertension Paternal Grandmother   . Stroke Mother   . Aneurysm Mother     brain  . Diabetes Paternal Grandmother   [name] died from a stroke at age 14. The patient's father died from a myocardial infarction at the age of 22. She has one sister and one brother. There is no history of breast or ovarian cancer in the family.   GYNECOLOGIC HISTORY: Menarche age 49. She was premenopausal at the time of diagnosis. She has never used birth control pills. She is GX P2. First pregnancy to term age 12. She had her last period at the time of her surgery.  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 She is currently separtated from her husband, Nataley Bahri, who works in Architect. Daughters, [name], 17, and [name], [place], are both in high school and both live with the patient.    ADVANCED DIRECTIVES:  HEALTH MAINTENANCE: (Updated 06/25/2013) History  Substance Use Topics  . Smoking status: Former Research scientist (life sciences)  . Smokeless tobacco: Never Used     Comment: quit 1998 - states only smoked for 1 month  . Alcohol Use: Yes     Comment: rarely     Colonoscopy: Never  PAP: Not on file  Bone density: 03/17/2014 t-score 0.0 (normal)  Lipid panel: Dr. Darron Doom    Allergies  Allergen Reactions  . Oxycodone Other (See Comments)    headache   . Tape Other (See Comments)    SKIN TEARS    Current Outpatient Prescriptions  Medication Sig Dispense Refill  . amLODipine (NORVASC) 5 MG tablet Take 1 tablet (5 mg total) by mouth daily.  30 tablet  1  . anastrozole (ARIMIDEX) 1 MG tablet TAKE ONE TABLET BY MOUTH ONCE DAILY  30 tablet  11  . B Complex-C (SUPER B COMPLEX PO) Take 1 tablet by mouth daily.      . benazepril-hydrochlorthiazide (LOTENSIN HCT) 20-12.5 MG per tablet Take 1 tablet by mouth daily.  30 tablet  1  . goserelin (ZOLADEX) 10.8 MG injection Inject 10.8 mg into the skin every 3 (three) months.      . naproxen sodium (ANAPROX) 220 MG tablet Take 220 mg by mouth 2 (two) times daily with a meal.      .  ranitidine (ZANTAC) 75 MG tablet Take 75 mg by mouth as needed. ONLY IF OUT OF PROTONIX      . benazepril-hydrochlorthiazide (LOTENSIN HCT) 20-12.5 MG per tablet Take 1 tablet by mouth 2 (two) times daily.      . ferrous sulfate (FERRO-BOB) 325 (65 FE) MG tablet Take 325 mg by mouth daily with breakfast.      . KLOR-CON M20 20 MEQ tablet TAKE ONE TABLET BY MOUTH DAILY  30 tablet  2  . metFORMIN (GLUCOPHAGE) 500 MG tablet Take 500 mg by mouth 2 (two) times daily with a meal.      . pantoprazole (PROTONIX) 40 MG tablet TAKE ONE TABLET BY MOUTH EVERY DAY  30 tablet  0   No current facility-administered medications for this visit.    OBJECTIVE: Young African American woman in no acute distress Filed Vitals:   04/07/14 1208  BP: 136/91  Pulse: 74  Temp: 98.5 F (36.9 C)  Resp: 18     Body mass index is 42.95 kg/(m^2).    ECOG FS: 1 Filed Weights   04/07/14 1208  Weight: 274 lb 4.8 oz (124.422 kg)   Skin: warm, dry  HEENT: sclerae anicteric, conjunctivae pink, oropharynx clear. No thrush or mucositis.  Lymph Nodes: No cervical or supraclavicular lymphadenopathy  Lungs: clear to auscultation bilaterally, no rales, wheezes, or rhonci  Heart: regular rate and rhythm  Abdomen: round, soft, non tender, positive bowel  sounds  Musculoskeletal: No focal spinal tenderness, no peripheral edema  Neuro: non focal, well oriented, positive affect  Breasts: right breast status post mastectomy and radiation, no evidence of disease recurrent, right axilla benign. Left breast unremarkable.    LAB RESULTS: Lab Results  Component Value Date   WBC 6.0 03/31/2014   NEUTROABS 3.7 03/31/2014   HGB 11.8 03/31/2014   HCT 38.1 03/31/2014   MCV 77.3* 03/31/2014   PLT 342 03/31/2014      Chemistry      Component Value Date/Time   NA 142 03/31/2014 1207   NA 143 09/09/2013 1422   K 3.6 03/31/2014 1207   K 3.6 09/09/2013 1422   CL 104 03/31/2014 1207   CL 101 12/21/2012 1019   CO2 29 03/31/2014 1207   CO2 29 09/09/2013 1422   BUN 11 03/31/2014 1207   BUN 12.4 09/09/2013 1422   CREATININE 0.82 03/31/2014 1207   CREATININE 0.8 09/09/2013 1422      Component Value Date/Time   CALCIUM 9.6 03/31/2014 1207   CALCIUM 9.7 09/09/2013 1422   ALKPHOS 103 03/31/2014 1207   ALKPHOS 97 09/09/2013 1422   AST 18 03/31/2014 1207   AST 19 09/09/2013 1422   ALT 13 03/31/2014 1207   ALT 18 09/09/2013 1422   BILITOT 0.5 03/31/2014 1207   BILITOT 0.33 09/09/2013 1422       STUDIES:  Most recent left mammogram at [facility] on 03/17/2013 was unremarkable.  Most recent bone density scan at [facility] on 03/17/2013 showed a t-score of 0.0 (normal)   ASSESSMENT: 38 y.o.  BRCA 1-2 negative [place] woman   (1) status post right breast upper outer quadrant biopsy July of 2012 for a clinical T3 N1 (stage III) invasive ductal carcinoma, grade 3, 99% estrogen and  100% progesterone receptor positive, HER-2 negative, with an MIB-1 of 98%.   (2) Status post 4 cycles of dose dense doxorubicin and cyclophosphamide, followed by weekly paclitaxel x 8, completed 08/25/2011  (3) s/p right mastectomy and sentinel lymph node sampling 10/14/2011 for  a residual ypT2 ypN1(mic) invasive ductal carcinoma, grade 3, with ample margins  (4) status post radiation therapy under  the care of Dr. Sondra Come,  completed in mid June.  (5)  Anti-estrogen therapy started 01/18/2012 with first Zoladex injection,  continued every 3 months. Anastrozole started in late June 2013, the goal being to continue for at least 5 years (June 2018).  (6) remote history of DVT  PLAN:  Rahcel is doing well as far as her breast cancer is concerned. The labs were reviewed in detail with the patient and were normal. The plan is to continue the anastrozole and the goserelin. I emphasized to her that she needs to be under the care of someone who can perform yearly physicals and screenings, and referred her to several PCP options in the area.  I have referred her to physical therapy to make adjustments to her right breast prosthesis as well. She says it has been irritating her lately and she cant find any bras that work with it well without too much manipulation.  She will see return for labs and an office visit with Dr. Jana Hakim in 6 months. Tanayia understands and agrees with this plan. She has been encouraged to call with any issues that may arise before her next visit here.  Marcelino Duster, NP     04/07/2014

## 2014-04-07 NOTE — Telephone Encounter (Signed)
, °

## 2014-05-23 ENCOUNTER — Other Ambulatory Visit (INDEPENDENT_AMBULATORY_CARE_PROVIDER_SITE_OTHER): Payer: BC Managed Care – PPO

## 2014-05-23 ENCOUNTER — Encounter: Payer: Self-pay | Admitting: Internal Medicine

## 2014-05-23 ENCOUNTER — Ambulatory Visit (INDEPENDENT_AMBULATORY_CARE_PROVIDER_SITE_OTHER): Payer: BC Managed Care – PPO | Admitting: Internal Medicine

## 2014-05-23 VITALS — BP 152/110 | HR 85 | Temp 98.7°F | Resp 16 | Ht 67.0 in | Wt 275.1 lb

## 2014-05-23 DIAGNOSIS — E669 Obesity, unspecified: Secondary | ICD-10-CM | POA: Insufficient documentation

## 2014-05-23 DIAGNOSIS — C50411 Malignant neoplasm of upper-outer quadrant of right female breast: Secondary | ICD-10-CM

## 2014-05-23 DIAGNOSIS — E1165 Type 2 diabetes mellitus with hyperglycemia: Secondary | ICD-10-CM

## 2014-05-23 DIAGNOSIS — K219 Gastro-esophageal reflux disease without esophagitis: Secondary | ICD-10-CM

## 2014-05-23 DIAGNOSIS — E119 Type 2 diabetes mellitus without complications: Secondary | ICD-10-CM

## 2014-05-23 DIAGNOSIS — IMO0002 Reserved for concepts with insufficient information to code with codable children: Secondary | ICD-10-CM | POA: Insufficient documentation

## 2014-05-23 DIAGNOSIS — I10 Essential (primary) hypertension: Secondary | ICD-10-CM

## 2014-05-23 LAB — LIPID PANEL
Cholesterol: 181 mg/dL (ref 0–200)
HDL: 34.2 mg/dL — AB (ref >39.00)
LDL Cholesterol: 119 mg/dL — ABNORMAL HIGH (ref 0–99)
NONHDL: 146.8
Total CHOL/HDL Ratio: 5
Triglycerides: 140 mg/dL (ref 0.0–149.0)
VLDL: 28 mg/dL (ref 0.0–40.0)

## 2014-05-23 LAB — BASIC METABOLIC PANEL
BUN: 11 mg/dL (ref 6–23)
CHLORIDE: 106 meq/L (ref 96–112)
CO2: 24 mEq/L (ref 19–32)
CREATININE: 0.8 mg/dL (ref 0.4–1.2)
Calcium: 9.6 mg/dL (ref 8.4–10.5)
GFR: 109.18 mL/min (ref >60.00)
Glucose, Bld: 91 mg/dL (ref 70–99)
POTASSIUM: 3.9 meq/L (ref 3.5–5.1)
Sodium: 141 mEq/L (ref 135–145)

## 2014-05-23 LAB — MICROALBUMIN / CREATININE URINE RATIO
CREATININE, U: 340.6 mg/dL
Microalb Creat Ratio: 3.5 mg/g (ref 0.0–30.0)
Microalb, Ur: 11.9 mg/dL — ABNORMAL HIGH (ref 0.0–1.9)

## 2014-05-23 LAB — HEMOGLOBIN A1C: HEMOGLOBIN A1C: 6.4 % (ref 4.6–6.5)

## 2014-05-23 MED ORDER — BENAZEPRIL-HYDROCHLOROTHIAZIDE 20-12.5 MG PO TABS: 1.0000 | ORAL_TABLET | Freq: Every day | ORAL | Status: DC

## 2014-05-23 MED ORDER — PANTOPRAZOLE SODIUM 40 MG PO TBEC: 40.0000 mg | DELAYED_RELEASE_TABLET | Freq: Every day | ORAL | Status: DC

## 2014-05-23 MED ORDER — METFORMIN HCL 500 MG PO TABS: 500.0000 mg | ORAL_TABLET | Freq: Two times a day (BID) | ORAL | Status: DC

## 2014-05-23 MED ORDER — AMLODIPINE BESYLATE 5 MG PO TABS: 5.0000 mg | ORAL_TABLET | Freq: Every day | ORAL | Status: DC

## 2014-05-23 NOTE — Patient Instructions (Signed)
We will check on your diabetes today and let you know about the results. We have refilled your medicines and want to see you back in about 3-6 months to check on the diabetes and blood pressure.

## 2014-05-23 NOTE — Progress Notes (Signed)
Pre visit review using our clinic review tool, if applicable. No additional management support is needed unless otherwise documented below in the visit note. 

## 2014-05-23 NOTE — Assessment & Plan Note (Signed)
Off metformin for 1 year and will check HgA1c today, microalbumin to creatinine ratio, foot exam done. Check lipid panel. Given pneumonia shot.

## 2014-05-23 NOTE — Assessment & Plan Note (Signed)
She continues to take arimidex and follows with oncology.

## 2014-05-23 NOTE — Progress Notes (Signed)
   Subjective:    Patient ID: [name], female    DOB: 05-19-1976, 38 y.o.   MRN: [id number]  HPI The patient is a 38 YO female who is coming in today to establish care. She has PMH of DM type 2, HTN, hyperlipidemia, GERD, breast cancer. She is working a lot of hours right now and is not able to eat well or exercise at all. She grabs fast food and junk food to eat during the day and does not usually cook when getting home from work (10PM). She is out of her blood pressure medicines for about 2 weeks now and has been feeling a little sluggish. She has had headache earlier in the week but not today. She denies chest pains, blurry vision. She has been off metformin for about 1 year since it was giving her diarrhea all the time. She is having some wrist pain after usage of her hand or certain positions but denies numbness.  Review of Systems  Constitutional: Negative for fever, activity change, appetite change and fatigue.  Respiratory: Negative for cough, chest tightness, shortness of breath and wheezing.   Cardiovascular: Negative for chest pain, palpitations and leg swelling.  Gastrointestinal: Negative for nausea, vomiting, abdominal pain, diarrhea, constipation and abdominal distention.  Endocrine: Negative.   Genitourinary: Negative.   Musculoskeletal: Negative.   Skin: Negative.   Neurological: Negative for dizziness, weakness, light-headedness and headaches.  Psychiatric/Behavioral: Negative.       Objective:   Physical Exam  Constitutional: She is oriented to person, place, and time. She appears well-developed and well-nourished.  Obese  HENT:  Head: Normocephalic and atraumatic.  Eyes: EOM are normal.  Neck: Normal range of motion.  Cardiovascular: Normal rate and regular rhythm.   No murmur heard. Pulmonary/Chest: Effort normal and breath sounds normal. No respiratory distress. She has no wheezes. She has no rales.  Abdominal: Soft. Bowel sounds are normal. She exhibits no  distension. There is no tenderness. There is no rebound.  Lymphadenopathy:    She has no cervical adenopathy.  Neurological: She is alert and oriented to person, place, and time. Coordination normal.  Skin: Skin is warm and dry.   Filed Vitals:   05/23/14 1034  BP: 152/110  Pulse: 85  Temp: 98.7 F (37.1 C)  TempSrc: Oral  Resp: 16  Height: 5\' 7"  (1.702 m)  Weight: 275 lb 1.9 oz (124.794 kg)  SpO2: 99%      Assessment & Plan:

## 2014-05-23 NOTE — Assessment & Plan Note (Signed)
BP high today and without symptoms. Out of medications for several weeks. Will refill and recheck at next visit. Check BMP today.

## 2014-05-23 NOTE — Assessment & Plan Note (Signed)
She is not eating well or exercising. Advised her to start exercising as she is unable to change her diet at this time.

## 2014-06-02 ENCOUNTER — Ambulatory Visit (HOSPITAL_BASED_OUTPATIENT_CLINIC_OR_DEPARTMENT_OTHER): Payer: BC Managed Care – PPO

## 2014-06-02 VITALS — BP 129/97 | HR 85 | Temp 98.4°F

## 2014-06-02 DIAGNOSIS — Z86718 Personal history of other venous thrombosis and embolism: Secondary | ICD-10-CM

## 2014-06-02 DIAGNOSIS — Z5111 Encounter for antineoplastic chemotherapy: Secondary | ICD-10-CM

## 2014-06-02 DIAGNOSIS — C50419 Malignant neoplasm of upper-outer quadrant of unspecified female breast: Secondary | ICD-10-CM

## 2014-06-02 MED ORDER — GOSERELIN ACETATE 10.8 MG ~~LOC~~ IMPL
10.8000 mg | DRUG_IMPLANT | Freq: Once | SUBCUTANEOUS | Status: AC
  Administered 2014-06-02: 10.8 mg via SUBCUTANEOUS
  Filled 2014-06-02: qty 10.8

## 2014-06-18 ENCOUNTER — Other Ambulatory Visit: Payer: Self-pay | Admitting: Nurse Practitioner

## 2014-06-23 ENCOUNTER — Encounter: Payer: Self-pay | Admitting: Internal Medicine

## 2014-09-01 ENCOUNTER — Ambulatory Visit (HOSPITAL_BASED_OUTPATIENT_CLINIC_OR_DEPARTMENT_OTHER): Payer: 59

## 2014-09-01 DIAGNOSIS — Z5111 Encounter for antineoplastic chemotherapy: Secondary | ICD-10-CM

## 2014-09-01 DIAGNOSIS — Z86718 Personal history of other venous thrombosis and embolism: Secondary | ICD-10-CM

## 2014-09-01 DIAGNOSIS — C50419 Malignant neoplasm of upper-outer quadrant of unspecified female breast: Secondary | ICD-10-CM

## 2014-09-01 MED ORDER — GOSERELIN ACETATE 10.8 MG ~~LOC~~ IMPL
10.8000 mg | DRUG_IMPLANT | Freq: Once | SUBCUTANEOUS | Status: AC
  Administered 2014-09-01: 10.8 mg via SUBCUTANEOUS
  Filled 2014-09-01: qty 10.8

## 2014-09-01 NOTE — Patient Instructions (Signed)
Goserelin injection What is this medicine? GOSERELIN (GOE se rel in) is similar to a hormone found in the body. It lowers the amount of sex hormones that the body makes. Men will have lower testosterone levels and women will have lower estrogen levels while taking this medicine. In men, this medicine is used to treat prostate cancer; the injection is either given once per month or once every 12 weeks. A once per month injection (only) is used to treat women with endometriosis, dysfunctional uterine bleeding, or advanced breast cancer. This medicine may be used for other purposes; ask your health care provider or pharmacist if you have questions. COMMON BRAND NAME(S): Zoladex What should I tell my health care provider before I take this medicine? They need to know if you have any of these conditions (some only apply to women): -diabetes -heart disease or previous heart attack -high blood pressure -high cholesterol -kidney disease -osteoporosis or low bone density -problems passing urine -spinal cord injury -stroke -tobacco smoker -an unusual or allergic reaction to goserelin, hormone therapy, other medicines, foods, dyes, or preservatives -pregnant or trying to get pregnant -breast-feeding How should I use this medicine? This medicine is for injection under the skin. It is given by a health care professional in a hospital or clinic setting. Men receive this injection once every 4 weeks or once every 12 weeks. Women will only receive the once every 4 weeks injection. Talk to your pediatrician regarding the use of this medicine in children. Special care may be needed. Overdosage: If you think you have taken too much of this medicine contact a poison control center or emergency room at once. NOTE: This medicine is only for you. Do not share this medicine with others. What if I miss a dose? It is important not to miss your dose. Call your doctor or health care professional if you are unable to  keep an appointment. What may interact with this medicine? -female hormones like estrogen -herbal or dietary supplements like black cohosh, chasteberry, or DHEA -female hormones like testosterone -prasterone This list may not describe all possible interactions. Give your health care provider a list of all the medicines, herbs, non-prescription drugs, or dietary supplements you use. Also tell them if you smoke, drink alcohol, or use illegal drugs. Some items may interact with your medicine. What should I watch for while using this medicine? Visit your doctor or health care professional for regular checks on your progress. Your symptoms may appear to get worse during the first weeks of this therapy. Tell your doctor or healthcare professional if your symptoms do not start to get better or if they get worse after this time. Your bones may get weaker if you take this medicine for a long time. If you smoke or frequently drink alcohol you may increase your risk of bone loss. A family history of osteoporosis, chronic use of drugs for seizures (convulsions), or corticosteroids can also increase your risk of bone loss. Talk to your doctor about how to keep your bones strong. This medicine should stop regular monthly menstration in women. Tell your doctor if you continue to menstrate. Women should not become pregnant while taking this medicine or for 12 weeks after stopping this medicine. Women should inform their doctor if they wish to become pregnant or think they might be pregnant. There is a potential for serious side effects to an unborn child. Talk to your health care professional or pharmacist for more information. Do not breast-feed an infant while taking   this medicine. Men should inform their doctors if they wish to father a child. This medicine may lower sperm counts. Talk to your health care professional or pharmacist for more information. What side effects may I notice from receiving this  medicine? Side effects that you should report to your doctor or health care professional as soon as possible: -allergic reactions like skin rash, itching or hives, swelling of the face, lips, or tongue -bone pain -breathing problems -changes in vision -chest pain -feeling faint or lightheaded, falls -fever, chills -pain, swelling, warmth in the leg -pain, tingling, numbness in the hands or feet -signs and symptoms of low blood pressure like dizziness; feeling faint or lightheaded, falls; unusually weak or tired -stomach pain -swelling of the ankles, feet, hands -trouble passing urine or change in the amount of urine -unusually high or low blood pressure -unusually weak or tired Side effects that usually do not require medical attention (report to your doctor or health care professional if they continue or are bothersome): -change in sex drive or performance -changes in breast size in both males and females -changes in emotions or moods -headache -hot flashes -irritation at site where injected -loss of appetite -skin problems like acne, dry skin -vaginal dryness This list may not describe all possible side effects. Call your doctor for medical advice about side effects. You may report side effects to FDA at 1-800-FDA-1088. Where should I keep my medicine? This drug is given in a hospital or clinic and will not be stored at home. NOTE: This sheet is a summary. It may not cover all possible information. If you have questions about this medicine, talk to your doctor, pharmacist, or health care provider.  2015, Elsevier/Gold Standard. (2013-10-15 11:10:35)  

## 2014-09-17 ENCOUNTER — Other Ambulatory Visit: Payer: Self-pay | Admitting: *Deleted

## 2014-09-17 DIAGNOSIS — C50919 Malignant neoplasm of unspecified site of unspecified female breast: Secondary | ICD-10-CM

## 2014-09-17 MED ORDER — ANASTROZOLE 1 MG PO TABS: ORAL_TABLET | ORAL | Status: DC

## 2014-10-06 ENCOUNTER — Telehealth: Payer: Self-pay | Admitting: Nurse Practitioner

## 2014-10-06 ENCOUNTER — Other Ambulatory Visit (HOSPITAL_BASED_OUTPATIENT_CLINIC_OR_DEPARTMENT_OTHER): Payer: 59

## 2014-10-06 ENCOUNTER — Ambulatory Visit (HOSPITAL_BASED_OUTPATIENT_CLINIC_OR_DEPARTMENT_OTHER): Payer: 59 | Admitting: Oncology

## 2014-10-06 VITALS — BP 151/86 | HR 81 | Temp 98.5°F | Resp 19 | Ht 67.0 in | Wt 289.9 lb

## 2014-10-06 DIAGNOSIS — C773 Secondary and unspecified malignant neoplasm of axilla and upper limb lymph nodes: Secondary | ICD-10-CM

## 2014-10-06 DIAGNOSIS — C50411 Malignant neoplasm of upper-outer quadrant of right female breast: Secondary | ICD-10-CM

## 2014-10-06 DIAGNOSIS — IMO0002 Reserved for concepts with insufficient information to code with codable children: Secondary | ICD-10-CM

## 2014-10-06 DIAGNOSIS — E1165 Type 2 diabetes mellitus with hyperglycemia: Secondary | ICD-10-CM

## 2014-10-06 DIAGNOSIS — Z86718 Personal history of other venous thrombosis and embolism: Secondary | ICD-10-CM

## 2014-10-06 LAB — COMPREHENSIVE METABOLIC PANEL (CC13)
ALBUMIN: 3.7 g/dL (ref 3.5–5.0)
ALK PHOS: 122 U/L (ref 40–150)
ALT: 17 U/L (ref 0–55)
ANION GAP: 11 meq/L (ref 3–11)
AST: 18 U/L (ref 5–34)
BUN: 11.9 mg/dL (ref 7.0–26.0)
CALCIUM: 9.3 mg/dL (ref 8.4–10.4)
CHLORIDE: 102 meq/L (ref 98–109)
CO2: 28 mEq/L (ref 22–29)
Creatinine: 0.8 mg/dL (ref 0.6–1.1)
Glucose: 105 mg/dl (ref 70–140)
POTASSIUM: 3.6 meq/L (ref 3.5–5.1)
SODIUM: 141 meq/L (ref 136–145)
Total Bilirubin: 0.33 mg/dL (ref 0.20–1.20)
Total Protein: 7.4 g/dL (ref 6.4–8.3)

## 2014-10-06 LAB — CBC WITH DIFFERENTIAL/PLATELET
BASO%: 0.8 % (ref 0.0–2.0)
BASOS ABS: 0 10*3/uL (ref 0.0–0.1)
EOS%: 3.8 % (ref 0.0–7.0)
Eosinophils Absolute: 0.2 10*3/uL (ref 0.0–0.5)
HCT: 36.8 % (ref 34.8–46.6)
HGB: 11.3 g/dL — ABNORMAL LOW (ref 11.6–15.9)
LYMPH%: 31.2 % (ref 14.0–49.7)
MCH: 24.7 pg — ABNORMAL LOW (ref 25.1–34.0)
MCHC: 30.7 g/dL — ABNORMAL LOW (ref 31.5–36.0)
MCV: 80.5 fL (ref 79.5–101.0)
MONO#: 0.5 10*3/uL (ref 0.1–0.9)
MONO%: 8.8 % (ref 0.0–14.0)
NEUT#: 2.9 10*3/uL (ref 1.5–6.5)
NEUT%: 55.4 % (ref 38.4–76.8)
Platelets: 405 10*3/uL — ABNORMAL HIGH (ref 145–400)
RBC: 4.57 10*6/uL (ref 3.70–5.45)
RDW: 14.2 % (ref 11.2–14.5)
WBC: 5.3 10*3/uL (ref 3.9–10.3)
lymph#: 1.6 10*3/uL (ref 0.9–3.3)

## 2014-10-06 LAB — FOLLICLE STIMULATING HORMONE: FSH: 5.7 m[IU]/mL

## 2014-10-06 NOTE — Telephone Encounter (Signed)
, °

## 2014-10-06 NOTE — Progress Notes (Signed)
ID: [name]   DOB: 06-30-1976  MR#: [id number]  JHE#:[id number]  PCP: Annette Millers, MD GYN: SU: Annette Messing, MD [name]:  Annette Pray, MD OTHER MD:  Annette Loffler, MD   CHIEF COMPLAINT:  Right Breast Cancer CURRENT THERAPY: anastrozole, goserelin  BREAST CANCER HISTORY: From the original intake note:  The patient developed pain in her right breast and brought it to her primary care physician's attention. Mammography at [facility] 03/10/2011 found the breasts to be heterogeneously dense. There was a mildly irregular mass posteriorly in the upper outer quadrant of the right breast. On ultrasound this was hypoechoic and measured approximately 2.4 cm, with irregular margins. The left breast was unremarkable.   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 Bilateral breast MRIs were obtained 03/18/2011, showing in the upper outer quadrant of the right breast an irregular mass measuring 5.5 cm. There was a suggestion of cortical thickening in the level I right axillary lymph node, which measured 1.2 cm. The patient was staged with chest CT scan and PET scan, which showed no distant disease. These studies did confirm the abnormality in the right breast as well as a hypermetabolic right axillary lymph node, making this a clinical T3 N1 or stage III invasive ductal carcinoma.   She completed neoadjuvant chemotherapy and proceeded to surgery and subsequent treatment as detailed below..  INTERVAL HISTORY: [name] returns today for followup of her right breast cancer. The interval history is generally unremarkable. She has joined [employer] and is trying to get some weight off. She is tolerating anastrozole better, with fewer hot flashes. She has some vaginal dryness. The goserelin shots her, but "she can take it". She is not  interested in proceeding to bilateral salpingo-oophorectomy at this point.Marland Kitchen  REVIEW OF SYSTEMS: [name] tells me she was recently diagnosed with glaucoma and is followed by Dr. Pieter Yoder for that. She has mild seasonal sinus problems. Occasionally she gets a little bit nauseated and we discussed the possibility of gallbladder issues and how she might be able to pick that up. She has some back pain itches worse when she sits for a long time. This comes and goes, it is not more persistent or intense than usual. Occasionally she feels forgetful. She denies significant stress at this point. She tells me her diabetes is well-controlled. A detailed review of systems today was otherwise stable.  PAST MEDICAL HISTORY: Past Medical History  Diagnosis Date  . Back pain     lumbar   . Anemia   . GERD (gastroesophageal reflux disease)   . Status post chemotherapy     finished 08/2011  . H/O scoliosis   . Peripheral neuropathy     s/p chemotherapy  . Hypertension     under control with med., has been on med. x 5 yr.  . Depression     no current med.  Marland Kitchen Headache(784.0)     hx. migraine in childhood  . Breast cancer 2012    right  . Gastric ulcer   . Cough 09/07/2012  . S/P radiation therapy 12/19/11 - 02/03/12    Right Chest Wall and Axilla  . Diabetes mellitus without complication     PAST SURGICAL HISTORY: Past Surgical History  Procedure Laterality Date  . Hip pinning  age 39    bilateral  . Portacath placement  04/08/2011  .  Mastectomy w/ sentinel node biopsy  10/14/2011    Procedure: MASTECTOMY WITH SENTINEL LYMPH NODE BIOPSY;  Surgeon: Annette Roof, MD;  Location: [facility];  Service: General;  Laterality: Right;  . Wound exploration  10/14/2011    right mastectomy wound  . Esophagogastroduodenoscopy (egd) with propofol  09/06/2012    Procedure: ESOPHAGOGASTRODUODENOSCOPY (EGD) WITH PROPOFOL;  Surgeon: Annette Banister, MD;  Location: [facility];  Service: Endoscopy;  Laterality: N/A;  .  Port-a-cath removal  09/12/2012    Procedure: REMOVAL PORT-A-CATH;  Surgeon: Annette Roof, MD;  Location: [facility];  Service: General;  Laterality: Left;    FAMILY HISTORY Family History  Problem Relation Age of Onset  . Heart disease Father     heart attack  . Heart attack Father     died age 3  . Hypertension Father   . Hypertension Mother   . Anemia Mother   . Diabetes Mother   . Hypertension Brother   . Diabetes Brother   . Heart disease Maternal Grandmother     MI  . Hypertension Maternal Grandmother   . Diabetes Maternal Grandmother   . Hypertension Paternal Grandmother   . Stroke Mother   . Aneurysm Mother     brain  . Diabetes Paternal Grandmother   [name] died from a stroke at age 10. The patient's father died from a myocardial infarction at the age of 5. She has one sister and one brother. There is no history of breast or ovarian cancer in the family.   GYNECOLOGIC HISTORY: Menarche age 86. She was premenopausal at the time of diagnosis. She has never used birth control pills. She is GX P2. First pregnancy to term age 1. She had her last period at the time of her surgery.  SOCIAL HISTORY:  (Updated 06/25/2013) She works as [name], about 52 hours a week. Her 38 year old daughter [name] will be attending [employer] fall 2016. Her other daughter, [name], is currently 61. The patient is currently separtated from her husband, [name], who works in Architect.     ADVANCED DIRECTIVES: not in place; at her 10/06/2014 visit the patient was given the appropriate documents 2 complete and notarize at her discretion.  HEALTH MAINTENANCE: (Updated 06/25/2013) History  Substance Use Topics  . Smoking status: Former Research scientist (life sciences)  . Smokeless tobacco: Never Used     Comment: quit 1998 - states only smoked for 1 month  . Alcohol Use: Yes     Comment: rarely     Colonoscopy: Never  PAP: Not on file  Bone density:  03/17/2014 t-score 0.0 (normal)  Lipid panel: [name]    Allergies  Allergen Reactions  . Oxycodone Other (See Comments)    headache  . Tape Other (See Comments)    SKIN TEARS    Current Outpatient Prescriptions  Medication Sig Dispense Refill  . amLODipine (NORVASC) 5 MG tablet Take 1 tablet (5 mg total) by mouth daily. 30 tablet 6  . anastrozole (ARIMIDEX) 1 MG tablet TAKE ONE TABLET BY MOUTH ONCE DAILY 30 tablet 11  . B Complex-C (SUPER B COMPLEX PO) Take 1 tablet by mouth daily.    . benazepril-hydrochlorthiazide (LOTENSIN HCT) 20-12.5 MG per tablet Take 1 tablet by mouth daily. 30 tablet 6  . ferrous sulfate (FERRO-BOB) 325 (65 FE) MG tablet Take 325 mg by mouth daily with breakfast.    . goserelin (ZOLADEX) 10.8 MG injection Inject 10.8 mg into  the skin every 3 (three) months.    Marland Kitchen KLOR-CON M20 20 MEQ tablet TAKE ONE TABLET BY MOUTH DAILY 30 tablet 2  . metFORMIN (GLUCOPHAGE) 500 MG tablet Take 1 tablet (500 mg total) by mouth 2 (two) times daily with a meal. 60 tablet 6  . naproxen sodium (ANAPROX) 220 MG tablet Take 220 mg by mouth 2 (two) times daily with a meal.    . pantoprazole (PROTONIX) 40 MG tablet Take 1 tablet (40 mg total) by mouth daily. 30 tablet 6  . ranitidine (ZANTAC) 75 MG tablet Take 75 mg by mouth as needed. ONLY IF OUT OF PROTONIX     No current facility-administered medications for this visit.    OBJECTIVE: [name] who appears well Filed Vitals:   10/06/14 1211  BP: 151/86  Pulse: 81  Temp: 98.5 F (36.9 C)  Resp: 19     Body mass index is 45.39 kg/(m^2).    ECOG FS: 0 Filed Weights   10/06/14 1211  Weight: 289 lb 14.4 oz (131.498 kg)   Sclerae unicteric, pupils equal and reactive Oropharynx clear and moist-- no thrush or other lesions No cervical or supraclavicular adenopathy Lungs no rales or rhonchi Heart regular rate and rhythm Abd soft, obese, nontender, positive bowel sounds MSK no focal spinal tenderness, no  upper extremity lymphedema Neuro: nonfocal, well oriented, appropriate affect Breasts: The right breast is status post mastectomy and radiation. There is no evidence of chest wall recurrence. The right axilla is benign. The left breast is unremarkable.   LAB RESULTS: Lab Results  Component Value Date   WBC 5.3 10/06/2014   NEUTROABS 2.9 10/06/2014   HGB 11.3* 10/06/2014   HCT 36.8 10/06/2014   MCV 80.5 10/06/2014   PLT 405* 10/06/2014      Chemistry      Component Value Date/Time   NA 141 05/23/2014 1110   NA 143 09/09/2013 1422   K 3.9 05/23/2014 1110   K 3.6 09/09/2013 1422   CL 106 05/23/2014 1110   CL 101 12/21/2012 1019   CO2 24 05/23/2014 1110   CO2 29 09/09/2013 1422   BUN 11 05/23/2014 1110   BUN 12.4 09/09/2013 1422   CREATININE 0.8 05/23/2014 1110   CREATININE 0.8 09/09/2013 1422      Component Value Date/Time   CALCIUM 9.6 05/23/2014 1110   CALCIUM 9.7 09/09/2013 1422   ALKPHOS 103 03/31/2014 1207   ALKPHOS 97 09/09/2013 1422   AST 18 03/31/2014 1207   AST 19 09/09/2013 1422   ALT 13 03/31/2014 1207   ALT 18 09/09/2013 1422   BILITOT 0.5 03/31/2014 1207   BILITOT 0.33 09/09/2013 1422       STUDIES: No results found.   ASSESSMENT: 39 y.o.  BRCA 1-2 negative [name]   (1) status post right breast upper outer quadrant biopsy July of 2012 for a clinical T3 N1 (stage III) invasive ductal carcinoma, grade 3, 99% estrogen and  100% progesterone receptor positive, HER-2 negative, with an MIB-1 of 98%.   (2) Status post 4 cycles of dose dense doxorubicin and cyclophosphamide, followed by weekly paclitaxel x 8, completed 08/25/2011  (3) s/p right mastectomy and sentinel lymph node sampling 10/14/2011 for a residual ypT2 ypN1(mic) invasive ductal carcinoma, grade 3, with ample margins  (4) status post radiation therapy under the care of Dr. Sondra Come,  completed in mid June.  (5)  Anti-estrogen therapy started 01/18/2012 with first Zoladex injection,   continued every 3 months.  Anastrozole started in late June 2013, the goal being to continue for at least 5 years (June 2018).  (6) remote history of DVT  PLAN:  [place] . Is now 2 years out from her definitive surgery with no evidence of disease recurrence. This is very favorable. The plan is to continue the anastrozole and goserelin for a minimum of 5 years. We discussed bilateral salpingo-oophorectomy but she would prefer to continue to receive the shots.  We are doing the goserelin every 3 months for her convenience. We are going to be checking and estradiol and FSH level with every dose area did we will continue every 6 month visit here. Her next visit therefore will be in August, shortly after her July mammography.  Today we reviewed the fact that even though she is separated from her husband they are not formally divorced and that means he is still her healthcare part of attorney. She really does not want that. I gave her a copy of the appropriate forms for her to complete and she is considering naming her older daughter as her healthcare part turning.  Cigi has a good understanding of the overall plan. She agrees with it. She knows a goal of treatment in her case is cure. She will call with any problems that may develop before her next visit here.   Chauncey Cruel, MD     10/06/2014

## 2014-10-10 LAB — ESTRADIOL, ULTRA SENS

## 2014-11-14 ENCOUNTER — Other Ambulatory Visit: Payer: Self-pay | Admitting: Nurse Practitioner

## 2014-11-20 ENCOUNTER — Other Ambulatory Visit: Payer: Self-pay | Admitting: *Deleted

## 2014-11-20 DIAGNOSIS — C50411 Malignant neoplasm of upper-outer quadrant of right female breast: Secondary | ICD-10-CM

## 2014-11-24 ENCOUNTER — Ambulatory Visit: Payer: 59

## 2014-11-24 ENCOUNTER — Other Ambulatory Visit: Payer: 59

## 2014-11-24 ENCOUNTER — Ambulatory Visit: Payer: BC Managed Care – PPO | Admitting: Internal Medicine

## 2014-12-01 ENCOUNTER — Ambulatory Visit (HOSPITAL_BASED_OUTPATIENT_CLINIC_OR_DEPARTMENT_OTHER): Payer: 59

## 2014-12-01 ENCOUNTER — Other Ambulatory Visit (HOSPITAL_BASED_OUTPATIENT_CLINIC_OR_DEPARTMENT_OTHER): Payer: 59

## 2014-12-01 DIAGNOSIS — Z86718 Personal history of other venous thrombosis and embolism: Secondary | ICD-10-CM

## 2014-12-01 DIAGNOSIS — C50411 Malignant neoplasm of upper-outer quadrant of right female breast: Secondary | ICD-10-CM

## 2014-12-01 DIAGNOSIS — Z5111 Encounter for antineoplastic chemotherapy: Secondary | ICD-10-CM | POA: Diagnosis not present

## 2014-12-01 LAB — COMPREHENSIVE METABOLIC PANEL (CC13)
ALT: 16 U/L (ref 0–55)
AST: 18 U/L (ref 5–34)
Albumin: 3.6 g/dL (ref 3.5–5.0)
Alkaline Phosphatase: 133 U/L (ref 40–150)
Anion Gap: 10 mEq/L (ref 3–11)
BUN: 11.6 mg/dL (ref 7.0–26.0)
CALCIUM: 9.5 mg/dL (ref 8.4–10.4)
CHLORIDE: 106 meq/L (ref 98–109)
CO2: 28 mEq/L (ref 22–29)
Creatinine: 0.8 mg/dL (ref 0.6–1.1)
EGFR: >90 mL/min/{1.73_m2} (ref >90)
Glucose: 156 mg/dl — ABNORMAL HIGH (ref 70–140)
Potassium: 3.4 mEq/L — ABNORMAL LOW (ref 3.5–5.1)
SODIUM: 145 meq/L (ref 136–145)
Total Bilirubin: 0.44 mg/dL (ref 0.20–1.20)
Total Protein: 7.7 g/dL (ref 6.4–8.3)

## 2014-12-01 LAB — CBC WITH DIFFERENTIAL/PLATELET
BASO%: 0.8 % (ref 0.0–2.0)
Basophils Absolute: 0.1 10*3/uL (ref 0.0–0.1)
EOS%: 4.1 % (ref 0.0–7.0)
Eosinophils Absolute: 0.3 10*3/uL (ref 0.0–0.5)
HCT: 37.5 % (ref 34.8–46.6)
HGB: 11.6 g/dL (ref 11.6–15.9)
LYMPH%: 21.1 % (ref 14.0–49.7)
MCH: 23.8 pg — AB (ref 25.1–34.0)
MCHC: 30.9 g/dL — AB (ref 31.5–36.0)
MCV: 77.1 fL — AB (ref 79.5–101.0)
MONO#: 0.5 10*3/uL (ref 0.1–0.9)
MONO%: 7.3 % (ref 0.0–14.0)
NEUT%: 66.7 % (ref 38.4–76.8)
NEUTROS ABS: 4.8 10*3/uL (ref 1.5–6.5)
PLATELETS: 393 10*3/uL (ref 145–400)
RBC: 4.87 10*6/uL (ref 3.70–5.45)
RDW: 13.5 % (ref 11.2–14.5)
WBC: 7.2 10*3/uL (ref 3.9–10.3)
lymph#: 1.5 10*3/uL (ref 0.9–3.3)

## 2014-12-01 MED ORDER — GOSERELIN ACETATE 10.8 MG ~~LOC~~ IMPL
10.8000 mg | DRUG_IMPLANT | Freq: Once | SUBCUTANEOUS | Status: AC
  Administered 2014-12-01: 10.8 mg via SUBCUTANEOUS
  Filled 2014-12-01: qty 10.8

## 2014-12-02 LAB — FOLLICLE STIMULATING HORMONE: FSH: 5.3 m[IU]/mL

## 2014-12-02 LAB — ESTRADIOL: Estradiol: <11.8 pg/mL

## 2014-12-09 ENCOUNTER — Other Ambulatory Visit (INDEPENDENT_AMBULATORY_CARE_PROVIDER_SITE_OTHER): Payer: 59

## 2014-12-09 ENCOUNTER — Encounter: Payer: Self-pay | Admitting: Internal Medicine

## 2014-12-09 ENCOUNTER — Ambulatory Visit (INDEPENDENT_AMBULATORY_CARE_PROVIDER_SITE_OTHER): Payer: 59 | Admitting: Internal Medicine

## 2014-12-09 VITALS — BP 130/102 | HR 91 | Temp 98.6°F | Resp 18 | Ht 67.0 in | Wt 286.1 lb

## 2014-12-09 DIAGNOSIS — E119 Type 2 diabetes mellitus without complications: Secondary | ICD-10-CM

## 2014-12-09 DIAGNOSIS — E669 Obesity, unspecified: Secondary | ICD-10-CM | POA: Diagnosis not present

## 2014-12-09 DIAGNOSIS — E1165 Type 2 diabetes mellitus with hyperglycemia: Secondary | ICD-10-CM

## 2014-12-09 DIAGNOSIS — I10 Essential (primary) hypertension: Secondary | ICD-10-CM

## 2014-12-09 DIAGNOSIS — IMO0002 Reserved for concepts with insufficient information to code with codable children: Secondary | ICD-10-CM

## 2014-12-09 LAB — COMPREHENSIVE METABOLIC PANEL
ALT: 14 U/L (ref 0–35)
AST: 16 U/L (ref 0–37)
Albumin: 4 g/dL (ref 3.5–5.2)
Alkaline Phosphatase: 124 U/L — ABNORMAL HIGH (ref 39–117)
BILIRUBIN TOTAL: 0.5 mg/dL (ref 0.2–1.2)
BUN: 12 mg/dL (ref 6–23)
CALCIUM: 9.8 mg/dL (ref 8.4–10.5)
CHLORIDE: 102 meq/L (ref 96–112)
CO2: 32 meq/L (ref 19–32)
Creatinine, Ser: 0.73 mg/dL (ref 0.40–1.20)
GFR: 114.05 mL/min (ref >60.00)
Glucose, Bld: 118 mg/dL — ABNORMAL HIGH (ref 70–99)
Potassium: 3.7 mEq/L (ref 3.5–5.1)
SODIUM: 140 meq/L (ref 135–145)
TOTAL PROTEIN: 7.6 g/dL (ref 6.0–8.3)

## 2014-12-09 LAB — HEMOGLOBIN A1C: Hgb A1c MFr Bld: 7.2 % — ABNORMAL HIGH (ref 4.6–6.5)

## 2014-12-09 MED ORDER — AMLODIPINE BESYLATE 10 MG PO TABS: 5.0000 mg | ORAL_TABLET | Freq: Every day | ORAL | Status: DC

## 2014-12-09 MED ORDER — BUDESONIDE 32 MCG/ACT NA SUSP: 2.0000 | Freq: Every day | NASAL | Status: DC

## 2014-12-09 NOTE — Progress Notes (Signed)
Pre visit review using our clinic review tool, if applicable. No additional management support is needed unless otherwise documented below in the visit note. 

## 2014-12-09 NOTE — Patient Instructions (Addendum)
We will recheck the labs today and call you back with the results.   We have sent in rhinocort for the allergies and sinuses. Use 2 puffs in each nostril once a day until you feel like you are better. You can keep the rest and use it if you feel like you are having the problems again. If you are not feeling better in 1-2 weeks please call us back.   We have also sent in the higher dose of the norvasc and you can just change doses when you get the new one.   Diabetes Mellitus and Food It is important for you to manage your blood sugar (glucose) level. Your blood glucose level can be greatly affected by what you eat. Eating healthier foods in the appropriate amounts throughout the day at about the same time each day will help you control your blood glucose level. It can also help slow or prevent worsening of your diabetes mellitus. Healthy eating may even help you improve the level of your blood pressure and reach or maintain a healthy weight.  HOW CAN FOOD AFFECT ME? Carbohydrates Carbohydrates affect your blood glucose level more than any other type of food. Your dietitian will help you determine how many carbohydrates to eat at each meal and teach you how to count carbohydrates. Counting carbohydrates is important to keep your blood glucose at a healthy level, especially if you are using insulin or taking certain medicines for diabetes mellitus. Alcohol Alcohol can cause sudden decreases in blood glucose (hypoglycemia), especially if you use insulin or take certain medicines for diabetes mellitus. Hypoglycemia can be a life-threatening condition. Symptoms of hypoglycemia (sleepiness, dizziness, and disorientation) are similar to symptoms of having too much alcohol.  If your health care provider has given you approval to drink alcohol, do so in moderation and use the following guidelines:  Women should not have more than one drink per day, and men should not have more than two drinks per day. One  drink is equal to:  12 oz of beer.  5 oz of wine.  1 oz of hard liquor.  Do not drink on an empty stomach.  Keep yourself hydrated. Have water, diet soda, or unsweetened iced tea.  Regular soda, juice, and other mixers might contain a lot of carbohydrates and should be counted. WHAT FOODS ARE NOT RECOMMENDED? As you make food choices, it is important to remember that all foods are not the same. Some foods have fewer nutrients per serving than other foods, even though they might have the same number of calories or carbohydrates. It is difficult to get your body what it needs when you eat foods with fewer nutrients. Examples of foods that you should avoid that are high in calories and carbohydrates but low in nutrients include:  Trans fats (most processed foods list trans fats on the Nutrition Facts label).  Regular soda.  Juice.  Candy.  Sweets, such as cake, pie, doughnuts, and cookies.  Fried foods. WHAT FOODS CAN I EAT? Have nutrient-rich foods, which will nourish your body and keep you healthy. The food you should eat also will depend on several factors, including:  The calories you need.  The medicines you take.  Your weight.  Your blood glucose level.  Your blood pressure level.  Your cholesterol level. You also should eat a variety of foods, including:  Protein, such as meat, poultry, fish, tofu, nuts, and seeds (lean animal proteins are best).  Fruits.  Vegetables.  Dairy products, such  as milk, cheese, and yogurt (low fat is best).  Breads, grains, pasta, cereal, rice, and beans.  Fats such as olive oil, trans fat-free margarine, canola oil, avocado, and olives. DOES EVERYONE WITH DIABETES MELLITUS HAVE THE SAME MEAL PLAN? Because every person with diabetes mellitus is different, there is not one meal plan that works for everyone. It is very important that you meet with a dietitian who will help you create a meal plan that is just right for  you. Document Released: 05/05/2005 Document Revised: 08/13/2013 Document Reviewed: 07/05/2013 [facility] Patient Information 2015 [place], [place]. This information is not intended to replace advice given to you by your health care provider. Make sure you discuss any questions you have with your health care provider.

## 2014-12-11 MED ORDER — AMLODIPINE BESYLATE 10 MG PO TABS: 10.0000 mg | ORAL_TABLET | Freq: Every day | ORAL | Status: DC

## 2014-12-11 NOTE — Assessment & Plan Note (Signed)
This problem is moderately worsened today and weight is up by about 10 pounds since last visit 6 months ago. Talked with her about the seriousness of her health concerns including her worsening hypertension, her diabetes, her past DVT (likely risk increased by her weight).

## 2014-12-11 NOTE — Assessment & Plan Note (Signed)
BP moderately worsened today and most recent several have been over goal. Increase her amlodipine to 10 mg daily from 5 mg daily. Continue benazepril/hctz (if BP still elevated room to increase this as well).

## 2014-12-11 NOTE — Assessment & Plan Note (Addendum)
Previous levels were well controlled but since she is up another 10 pounds I suspect her control will be worse this time. Talked to her about the serious affect her weight has on her health. She is trying to stay on track but might be helped by more frequent follow up to hold her accountable. Checking HgA1c today. Continue metformin and benazpril for now and adjust as needed once labs resulted.

## 2014-12-11 NOTE — Progress Notes (Signed)
   Subjective:    Patient ID: [name], female    DOB: [birth year], 39 y.o.   MRN: [id number]  HPI The patient is a 39 YO female who is coming in for follow up of her diabetes. She has been doing well with taking her medicines since last time. She has been having extra allergies and this has kept her from exercising much. Her diet has been okay but not great. She denies symptoms of low sugars. Her weight has gone up since 6 months ago by about 10 pounds although she peaked about 3-4 pounds higher than today (so is down from last month).   Review of Systems  Constitutional: Negative for fever, activity change, appetite change and fatigue.  HENT: Positive for congestion, postnasal drip and rhinorrhea. Negative for ear discharge, ear pain and sinus pressure.   Respiratory: Negative for cough, chest tightness, shortness of breath and wheezing.   Cardiovascular: Negative for chest pain, palpitations and leg swelling.  Gastrointestinal: Negative for nausea, vomiting, abdominal pain, diarrhea, constipation and abdominal distention.  Musculoskeletal: Negative.   Neurological: Negative for dizziness, weakness, light-headedness and headaches.  Psychiatric/Behavioral: Negative.       Objective:   Physical Exam  Constitutional: She is oriented to person, place, and time. She appears well-developed and well-nourished.  HENT:  Head: Atraumatic.  Mouth/Throat: Oropharynx is clear and moist.  Nose with erythema and clear crusting  Eyes: EOM are normal.  Neck: Normal range of motion.  Cardiovascular: Normal rate and regular rhythm.   Pulmonary/Chest: Effort normal.  Abdominal: Soft. She exhibits no distension. There is no tenderness. There is no rebound.  Neurological: She is alert and oriented to person, place, and time. Coordination normal.  Skin: Skin is warm and dry.   Filed Vitals:   12/09/14 1051  BP: 130/102  Pulse: 91  Temp: 98.6 F (37 C)  TempSrc: Oral  Resp: 18  Height: 5\' 7"   (1.702 m)  Weight: 286 lb 1.9 oz (129.783 kg)  SpO2: 98%      Assessment & Plan:

## 2014-12-24 ENCOUNTER — Other Ambulatory Visit: Payer: Self-pay | Admitting: Geriatric Medicine

## 2014-12-24 DIAGNOSIS — K219 Gastro-esophageal reflux disease without esophagitis: Secondary | ICD-10-CM

## 2014-12-24 MED ORDER — PANTOPRAZOLE SODIUM 40 MG PO TBEC: 40.0000 mg | DELAYED_RELEASE_TABLET | Freq: Every day | ORAL | Status: DC

## 2014-12-29 ENCOUNTER — Other Ambulatory Visit: Payer: Self-pay | Admitting: Geriatric Medicine

## 2014-12-29 MED ORDER — BENAZEPRIL-HYDROCHLOROTHIAZIDE 20-12.5 MG PO TABS: 1.0000 | ORAL_TABLET | Freq: Every day | ORAL | Status: DC

## 2015-01-28 ENCOUNTER — Other Ambulatory Visit: Payer: Self-pay | Admitting: Geriatric Medicine

## 2015-01-28 MED ORDER — AMLODIPINE BESYLATE 10 MG PO TABS: 10.0000 mg | ORAL_TABLET | Freq: Every day | ORAL | Status: DC

## 2015-02-16 ENCOUNTER — Other Ambulatory Visit: Payer: 59

## 2015-02-16 ENCOUNTER — Ambulatory Visit: Payer: 59

## 2015-02-24 ENCOUNTER — Ambulatory Visit (HOSPITAL_BASED_OUTPATIENT_CLINIC_OR_DEPARTMENT_OTHER): Payer: 59

## 2015-02-24 ENCOUNTER — Other Ambulatory Visit (HOSPITAL_BASED_OUTPATIENT_CLINIC_OR_DEPARTMENT_OTHER): Payer: 59

## 2015-02-24 ENCOUNTER — Other Ambulatory Visit: Payer: Self-pay | Admitting: Nurse Practitioner

## 2015-02-24 VITALS — BP 150/84 | HR 87 | Temp 98.5°F

## 2015-02-24 DIAGNOSIS — Z5111 Encounter for antineoplastic chemotherapy: Secondary | ICD-10-CM | POA: Diagnosis not present

## 2015-02-24 DIAGNOSIS — C50411 Malignant neoplasm of upper-outer quadrant of right female breast: Secondary | ICD-10-CM

## 2015-02-24 DIAGNOSIS — Z86718 Personal history of other venous thrombosis and embolism: Secondary | ICD-10-CM

## 2015-02-24 LAB — COMPREHENSIVE METABOLIC PANEL (CC13)
ALT: 16 U/L (ref 0–55)
ANION GAP: 8 meq/L (ref 3–11)
AST: 19 U/L (ref 5–34)
Albumin: 3.6 g/dL (ref 3.5–5.0)
Alkaline Phosphatase: 124 U/L (ref 40–150)
BUN: 13.7 mg/dL (ref 7.0–26.0)
CALCIUM: 9.8 mg/dL (ref 8.4–10.4)
CO2: 28 meq/L (ref 22–29)
CREATININE: 0.9 mg/dL (ref 0.6–1.1)
Chloride: 106 mEq/L (ref 98–109)
EGFR: >90 mL/min/{1.73_m2} (ref >90)
Glucose: 161 mg/dl — ABNORMAL HIGH (ref 70–140)
Potassium: 3.8 mEq/L (ref 3.5–5.1)
Sodium: 142 mEq/L (ref 136–145)
Total Bilirubin: 0.49 mg/dL (ref 0.20–1.20)
Total Protein: 7.3 g/dL (ref 6.4–8.3)

## 2015-02-24 LAB — CBC WITH DIFFERENTIAL/PLATELET
BASO%: 1.1 % (ref 0.0–2.0)
BASOS ABS: 0.1 10*3/uL (ref 0.0–0.1)
EOS%: 3.8 % (ref 0.0–7.0)
Eosinophils Absolute: 0.3 10*3/uL (ref 0.0–0.5)
HEMATOCRIT: 36.3 % (ref 34.8–46.6)
HGB: 11.6 g/dL (ref 11.6–15.9)
LYMPH%: 23 % (ref 14.0–49.7)
MCH: 24.9 pg — ABNORMAL LOW (ref 25.1–34.0)
MCHC: 32 g/dL (ref 31.5–36.0)
MCV: 77.9 fL — ABNORMAL LOW (ref 79.5–101.0)
MONO#: 0.6 10*3/uL (ref 0.1–0.9)
MONO%: 9 % (ref 0.0–14.0)
NEUT%: 63.1 % (ref 38.4–76.8)
NEUTROS ABS: 4.4 10*3/uL (ref 1.5–6.5)
Platelets: 396 10*3/uL (ref 145–400)
RBC: 4.66 10*6/uL (ref 3.70–5.45)
RDW: 14.1 % (ref 11.2–14.5)
WBC: 7 10*3/uL (ref 3.9–10.3)
lymph#: 1.6 10*3/uL (ref 0.9–3.3)

## 2015-02-24 MED ORDER — GOSERELIN ACETATE 10.8 MG ~~LOC~~ IMPL
10.8000 mg | DRUG_IMPLANT | Freq: Once | SUBCUTANEOUS | Status: AC
  Administered 2015-02-24: 10.8 mg via SUBCUTANEOUS
  Filled 2015-02-24: qty 10.8

## 2015-02-24 NOTE — Patient Instructions (Signed)
Goserelin injection What is this medicine? GOSERELIN (GOE se rel in) is similar to a hormone found in the body. It lowers the amount of sex hormones that the body makes. Men will have lower testosterone levels and women will have lower estrogen levels while taking this medicine. In men, this medicine is used to treat prostate cancer; the injection is either given once per month or once every 12 weeks. A once per month injection (only) is used to treat women with endometriosis, dysfunctional uterine bleeding, or advanced breast cancer. This medicine may be used for other purposes; ask your health care provider or pharmacist if you have questions. COMMON BRAND NAME(S): Zoladex What should I tell my health care provider before I take this medicine? They need to know if you have any of these conditions (some only apply to women): -diabetes -heart disease or previous heart attack -high blood pressure -high cholesterol -kidney disease -osteoporosis or low bone density -problems passing urine -spinal cord injury -stroke -tobacco smoker -an unusual or allergic reaction to goserelin, hormone therapy, other medicines, foods, dyes, or preservatives -pregnant or trying to get pregnant -breast-feeding How should I use this medicine? This medicine is for injection under the skin. It is given by a health care professional in a hospital or clinic setting. Men receive this injection once every 4 weeks or once every 12 weeks. Women will only receive the once every 4 weeks injection. Talk to your pediatrician regarding the use of this medicine in children. Special care may be needed. Overdosage: If you think you have taken too much of this medicine contact a poison control center or emergency room at once. NOTE: This medicine is only for you. Do not share this medicine with others. What if I miss a dose? It is important not to miss your dose. Call your doctor or health care professional if you are unable to  keep an appointment. What may interact with this medicine? -female hormones like estrogen -herbal or dietary supplements like black cohosh, chasteberry, or DHEA -female hormones like testosterone -prasterone This list may not describe all possible interactions. Give your health care provider a list of all the medicines, herbs, non-prescription drugs, or dietary supplements you use. Also tell them if you smoke, drink alcohol, or use illegal drugs. Some items may interact with your medicine. What should I watch for while using this medicine? Visit your doctor or health care professional for regular checks on your progress. Your symptoms may appear to get worse during the first weeks of this therapy. Tell your doctor or healthcare professional if your symptoms do not start to get better or if they get worse after this time. Your bones may get weaker if you take this medicine for a long time. If you smoke or frequently drink alcohol you may increase your risk of bone loss. A family history of osteoporosis, chronic use of drugs for seizures (convulsions), or corticosteroids can also increase your risk of bone loss. Talk to your doctor about how to keep your bones strong. This medicine should stop regular monthly menstration in women. Tell your doctor if you continue to menstrate. Women should not become pregnant while taking this medicine or for 12 weeks after stopping this medicine. Women should inform their doctor if they wish to become pregnant or think they might be pregnant. There is a potential for serious side effects to an unborn child. Talk to your health care professional or pharmacist for more information. Do not breast-feed an infant while taking   this medicine. Men should inform their doctors if they wish to father a child. This medicine may lower sperm counts. Talk to your health care professional or pharmacist for more information. What side effects may I notice from receiving this  medicine? Side effects that you should report to your doctor or health care professional as soon as possible: -allergic reactions like skin rash, itching or hives, swelling of the face, lips, or tongue -bone pain -breathing problems -changes in vision -chest pain -feeling faint or lightheaded, falls -fever, chills -pain, swelling, warmth in the leg -pain, tingling, numbness in the hands or feet -signs and symptoms of low blood pressure like dizziness; feeling faint or lightheaded, falls; unusually weak or tired -stomach pain -swelling of the ankles, feet, hands -trouble passing urine or change in the amount of urine -unusually high or low blood pressure -unusually weak or tired Side effects that usually do not require medical attention (report to your doctor or health care professional if they continue or are bothersome): -change in sex drive or performance -changes in breast size in both males and females -changes in emotions or moods -headache -hot flashes -irritation at site where injected -loss of appetite -skin problems like acne, dry skin -vaginal dryness This list may not describe all possible side effects. Call your doctor for medical advice about side effects. You may report side effects to FDA at 1-800-FDA-1088. Where should I keep my medicine? This drug is given in a hospital or clinic and will not be stored at home. NOTE: This sheet is a summary. It may not cover all possible information. If you have questions about this medicine, talk to your doctor, pharmacist, or health care provider.  2015, Elsevier/Gold Standard. (2013-10-15 11:10:35)  

## 2015-02-25 LAB — FOLLICLE STIMULATING HORMONE: FSH: 5.6 m[IU]/mL

## 2015-02-28 LAB — ESTRADIOL, ULTRA SENS

## 2015-03-05 ENCOUNTER — Ambulatory Visit: Payer: 59 | Admitting: Nurse Practitioner

## 2015-03-10 ENCOUNTER — Ambulatory Visit: Payer: 59 | Admitting: Internal Medicine

## 2015-03-16 ENCOUNTER — Ambulatory Visit: Payer: 59 | Admitting: Internal Medicine

## 2015-03-24 ENCOUNTER — Other Ambulatory Visit (INDEPENDENT_AMBULATORY_CARE_PROVIDER_SITE_OTHER): Payer: 59

## 2015-03-24 ENCOUNTER — Encounter: Payer: Self-pay | Admitting: Internal Medicine

## 2015-03-24 ENCOUNTER — Ambulatory Visit (INDEPENDENT_AMBULATORY_CARE_PROVIDER_SITE_OTHER): Payer: 59 | Admitting: Internal Medicine

## 2015-03-24 VITALS — BP 158/120 | HR 93 | Temp 98.8°F | Resp 16 | Ht 67.0 in | Wt 289.8 lb

## 2015-03-24 DIAGNOSIS — E119 Type 2 diabetes mellitus without complications: Secondary | ICD-10-CM | POA: Diagnosis not present

## 2015-03-24 DIAGNOSIS — E1165 Type 2 diabetes mellitus with hyperglycemia: Secondary | ICD-10-CM | POA: Diagnosis not present

## 2015-03-24 DIAGNOSIS — IMO0002 Reserved for concepts with insufficient information to code with codable children: Secondary | ICD-10-CM

## 2015-03-24 DIAGNOSIS — I10 Essential (primary) hypertension: Secondary | ICD-10-CM | POA: Diagnosis not present

## 2015-03-24 LAB — HEMOGLOBIN A1C: Hgb A1c MFr Bld: 7.4 % — ABNORMAL HIGH (ref 4.6–6.5)

## 2015-03-24 LAB — COMPREHENSIVE METABOLIC PANEL
ALK PHOS: 117 U/L (ref 39–117)
ALT: 16 U/L (ref 0–35)
AST: 18 U/L (ref 0–37)
Albumin: 4.1 g/dL (ref 3.5–5.2)
BILIRUBIN TOTAL: 0.5 mg/dL (ref 0.2–1.2)
BUN: 10 mg/dL (ref 6–23)
CALCIUM: 9.9 mg/dL (ref 8.4–10.5)
CHLORIDE: 102 meq/L (ref 96–112)
CO2: 34 mEq/L — ABNORMAL HIGH (ref 19–32)
CREATININE: 0.68 mg/dL (ref 0.40–1.20)
GFR: 123.6 mL/min (ref >60.00)
GLUCOSE: 131 mg/dL — AB (ref 70–99)
POTASSIUM: 3.8 meq/L (ref 3.5–5.1)
Sodium: 141 mEq/L (ref 135–145)
Total Protein: 7.8 g/dL (ref 6.0–8.3)

## 2015-03-24 NOTE — Progress Notes (Signed)
Pre visit review using our clinic review tool, if applicable. No additional management support is needed unless otherwise documented below in the visit note. 

## 2015-03-24 NOTE — Patient Instructions (Signed)
We want you to go up to taking 1 whole pill of the lotensin daily. Keep taking 1 pill of the amlodipine as well.   We will check the blood work today and call you back. We may end up increasing the dose of the metformin if the sugars are still up some.  Diabetes and Exercise Exercising regularly is important. It is not just about losing weight. It has many health benefits, such as:  Improving your overall fitness, flexibility, and endurance.  Increasing your bone density.  Helping with weight control.  Decreasing your body fat.  Increasing your muscle strength.  Reducing stress and tension.  Improving your overall health. People with diabetes who exercise gain additional benefits because exercise:  Reduces appetite.  Improves the body's use of blood sugar (glucose).  Helps lower or control blood glucose.  Decreases blood pressure.  Helps control blood lipids (such as cholesterol and triglycerides).  Improves the body's use of the hormone insulin by:  Increasing the body's insulin sensitivity.  Reducing the body's insulin needs.  Decreases the risk for heart disease because exercising:  Lowers cholesterol and triglycerides levels.  Increases the levels of good cholesterol (such as high-density lipoproteins [HDL]) in the body.  Lowers blood glucose levels. YOUR ACTIVITY PLAN  Choose an activity that you enjoy and set realistic goals. Your health care provider or diabetes educator can help you make an activity plan that works for you. Exercise regularly as directed by your health care provider. This includes:  Performing resistance training twice a week such as push-ups, sit-ups, lifting weights, or using resistance bands.  Performing 150 minutes of cardio exercises each week such as walking, running, or playing sports.  Staying active and spending no more than 90 minutes at one time being inactive. Even short bursts of exercise are good for you. Three 10-minute  sessions spread throughout the day are just as beneficial as a single 30-minute session. Some exercise ideas include:  Taking the dog for a walk.  Taking the stairs instead of the elevator.  Dancing to your favorite song.  Doing an exercise video.  Doing your favorite exercise with a friend. RECOMMENDATIONS FOR EXERCISING WITH TYPE 1 OR TYPE 2 DIABETES   Check your blood glucose before exercising. If blood glucose levels are greater than 240 mg/dL, check for urine ketones. Do not exercise if ketones are present.  Avoid injecting insulin into areas of the body that are going to be exercised. For example, avoid injecting insulin into:  The arms when playing tennis.  The legs when jogging.  Keep a record of:  Food intake before and after you exercise.  Expected peak times of insulin action.  Blood glucose levels before and after you exercise.  The type and amount of exercise you have done.  Review your records with your health care provider. Your health care provider will help you to develop guidelines for adjusting food intake and insulin amounts before and after exercising.  If you take insulin or oral hypoglycemic agents, watch for signs and symptoms of hypoglycemia. They include:  Dizziness.  Shaking.  Sweating.  Chills.  Confusion.  Drink plenty of water while you exercise to prevent dehydration or heat stroke. Body water is lost during exercise and must be replaced.  Talk to your health care provider before starting an exercise program to make sure it is safe for you. Remember, almost any type of activity is better than none. Document Released: 10/29/2003 Document Revised: 12/23/2013 Document Reviewed: 01/15/2013 [employer]  Patient Information 2015 [employer]. This information is not intended to replace advice given to you by your health care provider. Make sure you discuss any questions you have with your health care provider.  

## 2015-03-25 ENCOUNTER — Other Ambulatory Visit: Payer: Self-pay | Admitting: Internal Medicine

## 2015-03-25 MED ORDER — METFORMIN HCL 1000 MG PO TABS: 1000.0000 mg | ORAL_TABLET | Freq: Two times a day (BID) | ORAL | Status: DC

## 2015-03-25 NOTE — Assessment & Plan Note (Signed)
BP not controlled on amlodipine and lotensin. She was encouraged to start taking the lotensin as she was supposed to be taking 1 pill daily. See her back soon and if BP still above goal will increase to 2 pills daily. Checking BMP at visit.

## 2015-03-25 NOTE — Progress Notes (Signed)
   Subjective:    Patient ID: [name], female    DOB: May 16, 1976, 39 y.o.   MRN: [id number]  HPI The patient is a 39 YO female coming in for some headaches. She thinks her pressures have been running high at home. She has been taking the increased dose of amlodipine since last visit but is only taking 1/2 pill of the lotensin hctz instead of 1 pill (as she should be taking). Denies chest pains, nausea, confusion, vomiting. Has used tylenol occasionally for the headache with good relief.   Review of Systems  Constitutional: Negative for fever, activity change, appetite change and fatigue.  HENT: Negative for ear discharge, ear pain and sinus pressure.   Respiratory: Negative for cough, chest tightness, shortness of breath and wheezing.   Cardiovascular: Negative for chest pain, palpitations and leg swelling.  Gastrointestinal: Negative for nausea, vomiting, abdominal pain, diarrhea, constipation and abdominal distention.  Musculoskeletal: Negative.   Neurological: Positive for headaches. Negative for dizziness, weakness and light-headedness.  Psychiatric/Behavioral: Negative.       Objective:   Physical Exam  Constitutional: She is oriented to person, place, and time. She appears well-developed and well-nourished.  HENT:  Head: Atraumatic.  Mouth/Throat: Oropharynx is clear and moist.  Eyes: EOM are normal.  Neck: Normal range of motion.  Cardiovascular: Normal rate and regular rhythm.   Pulmonary/Chest: Effort normal. No respiratory distress. She has no wheezes. She has no rales.  Abdominal: Soft. She exhibits no distension. There is no tenderness. There is no rebound.  Neurological: She is alert and oriented to person, place, and time. Coordination normal.  Skin: Skin is warm and dry.   Filed Vitals:   03/24/15 1047  BP: 158/120  Pulse: 93  Temp: 98.8 F (37.1 C)  TempSrc: Oral  Resp: 16  Height: 5\' 7"  (1.702 m)  Weight: 289 lb 12.8 oz (131.452 kg)  SpO2: 98%        Assessment & Plan:

## 2015-03-25 NOTE — Assessment & Plan Note (Signed)
Checking HgA1c today and likely will increase the metformin to 1000 mg BID since she is not having any side effects from current dosing and was not at goal last time.

## 2015-04-01 ENCOUNTER — Telehealth: Payer: Self-pay | Admitting: Nurse Practitioner

## 2015-04-01 ENCOUNTER — Other Ambulatory Visit: Payer: Self-pay | Admitting: *Deleted

## 2015-04-01 ENCOUNTER — Encounter: Payer: Self-pay | Admitting: Nurse Practitioner

## 2015-04-01 ENCOUNTER — Ambulatory Visit (HOSPITAL_BASED_OUTPATIENT_CLINIC_OR_DEPARTMENT_OTHER): Payer: 59 | Admitting: Nurse Practitioner

## 2015-04-01 VITALS — BP 127/90 | HR 91 | Temp 98.6°F | Resp 18 | Ht 67.0 in | Wt 296.1 lb

## 2015-04-01 DIAGNOSIS — Z86718 Personal history of other venous thrombosis and embolism: Secondary | ICD-10-CM

## 2015-04-01 DIAGNOSIS — C50411 Malignant neoplasm of upper-outer quadrant of right female breast: Secondary | ICD-10-CM | POA: Diagnosis not present

## 2015-04-01 DIAGNOSIS — Z79811 Long term (current) use of aromatase inhibitors: Secondary | ICD-10-CM

## 2015-04-01 DIAGNOSIS — Z170 Estrogen receptor positive status [ER+]: Secondary | ICD-10-CM | POA: Diagnosis not present

## 2015-04-01 DIAGNOSIS — E2839 Other primary ovarian failure: Secondary | ICD-10-CM

## 2015-04-01 NOTE — Telephone Encounter (Signed)
Gave avs & appointment for September,January, February. [facility]o and bone density is schedule for 08/25 @ 3

## 2015-04-01 NOTE — Progress Notes (Signed)
ID: [name]   DOB: [birth year]  MR#: [id number]  PPI#:[id number]  PCP: Olga Millers, MD GYN: SU: Autumn Messing, MD [place]:  Gery Pray, MD OTHER MD:  Owens Loffler, MD   CHIEF COMPLAINT:  Right Breast Cancer CURRENT THERAPY: anastrozole, goserelin  BREAST CANCER HISTORY: From the original intake note:  The patient developed pain in her right breast and brought it to her primary care physician's attention. Mammography at [facility] 03/10/2011 found the breasts to be heterogeneously dense. There was a mildly irregular mass posteriorly in the upper outer quadrant of the right breast. On ultrasound this was hypoechoic and measured approximately 2.4 cm, with irregular margins. The left breast was unremarkable.   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 Bilateral breast MRIs were obtained 03/18/2011, showing in the upper outer quadrant of the right breast an irregular mass measuring 5.5 cm. There was a suggestion of cortical thickening in the level I right axillary lymph node, which measured 1.2 cm. The patient was staged with chest CT scan and PET scan, which showed no distant disease. These studies did confirm the abnormality in the right breast as well as a hypermetabolic right axillary lymph node, making this a clinical T3 N1 or stage III invasive ductal carcinoma.   She completed neoadjuvant chemotherapy and proceeded to surgery and subsequent treatment as detailed below..  INTERVAL HISTORY: [name] returns today for follow up of her right breast cancer. She has been on anastrozole since June 2013 and is tolerating this drug well. She has mild hot flashes, but denies vaginal changes or arthralgias/myalgias. She continues on goserelin injections ever 3 months as well for ovarian suppression.   REVIEW OF  SYSTEMS: [name] denies fevers, chills, nausea, vomiting, or changes in bowel or bladder habits. She has been non complaint with some of her diabetes medications, and her ANC is up to 7.4. The top of her right foot is burning. She has pain and spasms to her back. She has had headaches lately, but her blood pressure has been elevated. Her PCP started her a new dose of medicine, but she has not started that yet. She is occasionally short of breath. She denies chest pain, cough, or palpitations. A detailed review of systems is otherwise stable.  PAST MEDICAL HISTORY: Past Medical History  Diagnosis Date  . Back pain     lumbar   . Anemia   . GERD (gastroesophageal reflux disease)   . Status post chemotherapy     finished 08/2011  . H/O scoliosis   . Peripheral neuropathy     s/p chemotherapy  . Hypertension     under control with med., has been on med. x 5 yr.  . Depression     no current med.  Marland Kitchen Headache(784.0)     hx. migraine in childhood  . Breast cancer 2012    right  . Gastric ulcer   . Cough 09/07/2012  . S/P radiation therapy 12/19/11 - 02/03/12    Right Chest Wall and Axilla  . Diabetes mellitus without complication     PAST SURGICAL HISTORY: Past Surgical History  Procedure Laterality Date  . Hip pinning  age 82    bilateral  . Portacath placement  04/08/2011  . Mastectomy w/ sentinel node biopsy  10/14/2011    Procedure: MASTECTOMY WITH SENTINEL LYMPH NODE BIOPSY;  Surgeon:  Merrie Roof, MD;  Location: [facility];  Service: General;  Laterality: Right;  . Wound exploration  10/14/2011    right mastectomy wound  . Esophagogastroduodenoscopy (egd) with propofol  09/06/2012    Procedure: ESOPHAGOGASTRODUODENOSCOPY (EGD) WITH PROPOFOL;  Surgeon: Milus Banister, MD;  Location: [facility];  Service: Endoscopy;  Laterality: N/A;  . Port-a-cath removal  09/12/2012    Procedure: REMOVAL PORT-A-CATH;  Surgeon: Merrie Roof, MD;  Location: [facility];  Service:  General;  Laterality: Left;    FAMILY HISTORY Family History  Problem Relation Age of Onset  . Heart disease Father     heart attack  . Heart attack Father     died age 65  . Hypertension Father   . Hypertension Mother   . Anemia Mother   . Diabetes Mother   . Hypertension Brother   . Diabetes Brother   . Heart disease Maternal Grandmother     MI  . Hypertension Maternal Grandmother   . Diabetes Maternal Grandmother   . Hypertension Paternal Grandmother   . Stroke Mother   . Aneurysm Mother     brain  . Diabetes Paternal Grandmother   [name] died from a stroke at age 1. The patient's father died from a myocardial infarction at the age of 54. She has one sister and one brother. There is no history of breast or ovarian cancer in the family.   GYNECOLOGIC HISTORY: Menarche age 31. She was premenopausal at the time of diagnosis. She has never used birth control pills. She is GX P2. First pregnancy to term age 11. She had her last period at the time of her surgery.  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 Her 44 year old daughter [name] will be attending [employer] fall 2016. Her other daughter, [name], is currently 29. The patient is currently separtated from her husband, Gabby Rackers, who works in Architect.     ADVANCED DIRECTIVES: not in place; at her 10/06/2014 visit the patient was given the appropriate documents 2 complete and notarize at her discretion.  HEALTH MAINTENANCE: (Updated 06/25/2013) Social History  Substance Use Topics  . Smoking status: Former Research scientist (life sciences)  . Smokeless tobacco: Never Used     Comment: quit 1998 - states only smoked for 1 month  . Alcohol Use: Yes     Comment: rarely     Colonoscopy: Never  PAP: Not on file  Bone density: 03/17/2014 t-score 0.0 (normal)  Lipid panel: Dr. Darron Doom    Allergies  Allergen Reactions  . Oxycodone Other (See Comments)     headache  . Tape Other (See Comments)    SKIN TEARS    Current Outpatient Prescriptions  Medication Sig Dispense Refill  . amLODipine (NORVASC) 10 MG tablet Take 1 tablet (10 mg total) by mouth daily. (Patient taking differently: Take 10 mg by mouth daily. Pt takes 5 mg of this medication until these run out which will be 3 more days then will start taking 10 mg) 90 tablet 3  . anastrozole (ARIMIDEX) 1 MG tablet TAKE ONE TABLET BY MOUTH ONCE DAILY 30 tablet 11  . B Complex-C (SUPER B COMPLEX PO) Take 1 tablet by mouth daily.    . benazepril-hydrochlorthiazide (LOTENSIN HCT) 20-12.5 MG per tablet Take 1 tablet by mouth daily. 30 tablet 3  . goserelin (ZOLADEX) 10.8 MG injection Inject 10.8 mg into the skin every 3 (three) months.    Marland Kitchen  pantoprazole (PROTONIX) 40 MG tablet Take 1 tablet (40 mg total) by mouth daily. 30 tablet 6  . budesonide (RHINOCORT AQUA) 32 MCG/ACT nasal spray Place 2 sprays into both nostrils daily. (Patient not taking: Reported on 04/01/2015) 8.6 g 2  . metFORMIN (GLUCOPHAGE) 1000 MG tablet Take 1 tablet (1,000 mg total) by mouth 2 (two) times daily with a meal. (Patient not taking: Reported on 04/01/2015) 60 tablet 11  . naproxen sodium (ANAPROX) 220 MG tablet Take 220 mg by mouth 2 (two) times daily with a meal.    . ranitidine (ZANTAC) 75 MG tablet Take 75 mg by mouth as needed. ONLY IF OUT OF PROTONIX     No current facility-administered medications for this visit.    OBJECTIVE: [name] who appears well Filed Vitals:   04/01/15 1540  BP: 127/90  Pulse:   Temp:   Resp:      Body mass index is 46.36 kg/(m^2).    ECOG FS: 0 Filed Weights   04/01/15 1539  Weight: 296 lb 1.6 oz (134.31 kg)   Skin: warm, dry  HEENT: sclerae anicteric, conjunctivae pink, oropharynx clear. No thrush or mucositis.  Lymph Nodes: No cervical or supraclavicular lymphadenopathy  Lungs: clear to auscultation bilaterally, no rales, wheezes, or rhonci  Heart: regular  rate and rhythm  Abdomen: obese, soft, non tender, positive bowel sounds  Musculoskeletal: No focal spinal tenderness, no peripheral edema  Neuro: non focal, well oriented, positive affect  Breasts: right breast status post mastectomy and radiation. Residual hyperpigmentation noted. No evidence of recurrent disease. Right axilla benign. Left breast unremarkable.  LAB RESULTS: Lab Results  Component Value Date   WBC 7.0 02/24/2015   NEUTROABS 4.4 02/24/2015   HGB 11.6 02/24/2015   HCT 36.3 02/24/2015   MCV 77.9* 02/24/2015   PLT 396 02/24/2015      Chemistry      Component Value Date/Time   NA 141 03/24/2015 1117   NA 142 02/24/2015 1246   K 3.8 03/24/2015 1117   K 3.8 02/24/2015 1246   CL 102 03/24/2015 1117   CL 101 12/21/2012 1019   CO2 34* 03/24/2015 1117   CO2 28 02/24/2015 1246   BUN 10 03/24/2015 1117   BUN 13.7 02/24/2015 1246   CREATININE 0.68 03/24/2015 1117   CREATININE 0.9 02/24/2015 1246      Component Value Date/Time   CALCIUM 9.9 03/24/2015 1117   CALCIUM 9.8 02/24/2015 1246   ALKPHOS 117 03/24/2015 1117   ALKPHOS 124 02/24/2015 1246   AST 18 03/24/2015 1117   AST 19 02/24/2015 1246   ALT 16 03/24/2015 1117   ALT 16 02/24/2015 1246   BILITOT 0.5 03/24/2015 1117   BILITOT 0.49 02/24/2015 1246       STUDIES: No results found.   ASSESSMENT: 39 y.o.  BRCA 1-2 negative [place] Yoder   (1) status post right breast upper outer quadrant biopsy July of 2012 for a clinical T3 N1 (stage III) invasive ductal carcinoma, grade 3, 99% estrogen and  100% progesterone receptor positive, HER-2 negative, with an MIB-1 of 98%.   (2) Status post 4 cycles of dose dense doxorubicin and cyclophosphamide, followed by weekly paclitaxel x 8, completed 08/25/2011  (3) s/p right mastectomy and sentinel lymph node sampling 10/14/2011 for a residual ypT2 ypN1(mic) invasive ductal carcinoma, grade 3, with ample margins  (4) status post radiation therapy under the care of  Dr. Sondra Come,  completed in mid June.  (5)  Anti-estrogen therapy started  01/18/2012 with first Zoladex injection,  continued every 3 months. Anastrozole started in late June 2013, the goal being to continue for at least 5 years (June 2018).  (6) remote history of DVT  PLAN:  Danissa is doing well as far as her breast cancer is concerned. She is now 4 years out from her definitive surgery with no evidence of recurrent disease. She is tolerating the anastrozole well and will continue this for 5 years of antiestrogen therapy. She will continue the zoladex injections every 3 months as well.   She is overdue for a left mammogram and bone density scans, so I have placed the placed the proper orders for this to be performed at [facility] this month.   Marcene will return for labs and a follow up visit in 6 months. She understands and agrees with this plan. She knows the goal of treatment in her case is cure. She has been encouraged to call with any issues that might arise before her next visit here.  Laurie Panda, NP     04/01/2015

## 2015-05-11 ENCOUNTER — Other Ambulatory Visit: Payer: Self-pay | Admitting: Internal Medicine

## 2015-05-14 ENCOUNTER — Telehealth: Payer: Self-pay | Admitting: Oncology

## 2015-05-14 NOTE — Telephone Encounter (Signed)
Returned Advertising account executive. Patient confirmed appointment moved from 09/26 to 09/27

## 2015-05-18 ENCOUNTER — Ambulatory Visit: Payer: 59

## 2015-05-18 ENCOUNTER — Other Ambulatory Visit: Payer: 59

## 2015-05-19 ENCOUNTER — Other Ambulatory Visit: Payer: Self-pay

## 2015-05-19 ENCOUNTER — Telehealth: Payer: Self-pay

## 2015-05-19 ENCOUNTER — Telehealth: Payer: Self-pay | Admitting: Oncology

## 2015-05-19 ENCOUNTER — Other Ambulatory Visit (HOSPITAL_BASED_OUTPATIENT_CLINIC_OR_DEPARTMENT_OTHER): Payer: 59

## 2015-05-19 ENCOUNTER — Ambulatory Visit: Payer: 59

## 2015-05-19 DIAGNOSIS — C50411 Malignant neoplasm of upper-outer quadrant of right female breast: Secondary | ICD-10-CM

## 2015-05-19 LAB — CBC WITH DIFFERENTIAL/PLATELET
BASO%: 0.9 % (ref 0.0–2.0)
Basophils Absolute: 0.1 10*3/uL (ref 0.0–0.1)
EOS%: 4.8 % (ref 0.0–7.0)
Eosinophils Absolute: 0.3 10*3/uL (ref 0.0–0.5)
HCT: 36.1 % (ref 34.8–46.6)
HGB: 11.4 g/dL — ABNORMAL LOW (ref 11.6–15.9)
LYMPH%: 24.7 % (ref 14.0–49.7)
MCH: 24.3 pg — AB (ref 25.1–34.0)
MCHC: 31.5 g/dL (ref 31.5–36.0)
MCV: 77.2 fL — ABNORMAL LOW (ref 79.5–101.0)
MONO#: 0.5 10*3/uL (ref 0.1–0.9)
MONO%: 7.2 % (ref 0.0–14.0)
NEUT#: 4.2 10*3/uL (ref 1.5–6.5)
NEUT%: 62.4 % (ref 38.4–76.8)
Platelets: 363 10*3/uL (ref 145–400)
RBC: 4.67 10*6/uL (ref 3.70–5.45)
RDW: 14 % (ref 11.2–14.5)
WBC: 6.7 10*3/uL (ref 3.9–10.3)
lymph#: 1.6 10*3/uL (ref 0.9–3.3)

## 2015-05-19 LAB — COMPREHENSIVE METABOLIC PANEL (CC13)
ALT: 19 U/L (ref 0–55)
AST: 17 U/L (ref 5–34)
Albumin: 3.5 g/dL (ref 3.5–5.0)
Alkaline Phosphatase: 127 U/L (ref 40–150)
Anion Gap: 10 mEq/L (ref 3–11)
BUN: 10.7 mg/dL (ref 7.0–26.0)
CHLORIDE: 105 meq/L (ref 98–109)
CO2: 26 mEq/L (ref 22–29)
Calcium: 9.4 mg/dL (ref 8.4–10.4)
Creatinine: 0.9 mg/dL (ref 0.6–1.1)
EGFR: >90 mL/min/{1.73_m2} (ref >90)
GLUCOSE: 193 mg/dL — AB (ref 70–140)
POTASSIUM: 3.8 meq/L (ref 3.5–5.1)
SODIUM: 142 meq/L (ref 136–145)
Total Bilirubin: 0.46 mg/dL (ref 0.20–1.20)
Total Protein: 7.2 g/dL (ref 6.4–8.3)

## 2015-05-19 MED ORDER — AMLODIPINE BESYLATE 10 MG PO TABS: 10.0000 mg | ORAL_TABLET | Freq: Every day | ORAL | Status: DC

## 2015-05-19 MED ORDER — GOSERELIN ACETATE 10.8 MG ~~LOC~~ IMPL
10.8000 mg | DRUG_IMPLANT | Freq: Once | SUBCUTANEOUS | Status: DC
  Filled 2015-05-19: qty 10.8

## 2015-05-19 NOTE — Telephone Encounter (Signed)
Patient called to reschedule appointment for Injection today, patient walked out due to wait time. Patient confirmed appointment for 09/29.

## 2015-05-20 LAB — FOLLICLE STIMULATING HORMONE: FSH: 6.1 m[IU]/mL

## 2015-05-21 ENCOUNTER — Ambulatory Visit (HOSPITAL_BASED_OUTPATIENT_CLINIC_OR_DEPARTMENT_OTHER): Payer: 59

## 2015-05-21 VITALS — BP 139/92 | HR 85 | Temp 98.9°F

## 2015-05-21 DIAGNOSIS — C50411 Malignant neoplasm of upper-outer quadrant of right female breast: Secondary | ICD-10-CM | POA: Diagnosis not present

## 2015-05-21 DIAGNOSIS — Z5111 Encounter for antineoplastic chemotherapy: Secondary | ICD-10-CM | POA: Diagnosis not present

## 2015-05-21 DIAGNOSIS — Z86718 Personal history of other venous thrombosis and embolism: Secondary | ICD-10-CM

## 2015-05-21 MED ORDER — GOSERELIN ACETATE 10.8 MG ~~LOC~~ IMPL
10.8000 mg | DRUG_IMPLANT | Freq: Once | SUBCUTANEOUS | Status: AC
  Administered 2015-05-21: 10.8 mg via SUBCUTANEOUS
  Filled 2015-05-21: qty 10.8

## 2015-05-23 LAB — ESTRADIOL, ULTRA SENS: Estradiol, Ultra Sensitive: 3 pg/mL

## 2015-05-26 ENCOUNTER — Encounter: Payer: Self-pay | Admitting: Oncology

## 2015-06-12 ENCOUNTER — Other Ambulatory Visit: Payer: Self-pay | Admitting: Internal Medicine

## 2015-06-17 ENCOUNTER — Encounter: Payer: Self-pay | Admitting: Oncology

## 2015-06-23 ENCOUNTER — Ambulatory Visit: Payer: 59 | Admitting: Internal Medicine

## 2015-06-23 DIAGNOSIS — Z0289 Encounter for other administrative examinations: Secondary | ICD-10-CM

## 2015-08-04 ENCOUNTER — Telehealth: Payer: Self-pay

## 2015-08-04 NOTE — Telephone Encounter (Signed)
Pre visit review using our clinic review tool, if applicable. No additional management support is needed unless otherwise documented below in the visit note. 

## 2015-08-09 ENCOUNTER — Other Ambulatory Visit: Payer: Self-pay | Admitting: Internal Medicine

## 2015-08-10 NOTE — Telephone Encounter (Signed)
Erroneous

## 2015-08-25 ENCOUNTER — Ambulatory Visit: Payer: 59

## 2015-08-25 ENCOUNTER — Other Ambulatory Visit (HOSPITAL_BASED_OUTPATIENT_CLINIC_OR_DEPARTMENT_OTHER): Payer: 59

## 2015-08-25 DIAGNOSIS — C50411 Malignant neoplasm of upper-outer quadrant of right female breast: Secondary | ICD-10-CM

## 2015-08-25 LAB — CBC WITH DIFFERENTIAL/PLATELET
BASO%: 0.7 % (ref 0.0–2.0)
Basophils Absolute: 0.1 10e3/uL (ref 0.0–0.1)
EOS%: 3.3 % (ref 0.0–7.0)
Eosinophils Absolute: 0.3 10e3/uL (ref 0.0–0.5)
HCT: 38.6 % (ref 34.8–46.6)
HGB: 12 g/dL (ref 11.6–15.9)
LYMPH%: 18.2 % (ref 14.0–49.7)
MCH: 24.3 pg — ABNORMAL LOW (ref 25.1–34.0)
MCHC: 31 g/dL — ABNORMAL LOW (ref 31.5–36.0)
MCV: 78.5 fL — ABNORMAL LOW (ref 79.5–101.0)
MONO#: 0.6 10e3/uL (ref 0.1–0.9)
MONO%: 7.8 % (ref 0.0–14.0)
NEUT#: 5.6 10e3/uL (ref 1.5–6.5)
NEUT%: 70 % (ref 38.4–76.8)
Platelets: 397 10e3/uL (ref 145–400)
RBC: 4.92 10e6/uL (ref 3.70–5.45)
RDW: 13.8 % (ref 11.2–14.5)
WBC: 7.9 10e3/uL (ref 3.9–10.3)
lymph#: 1.4 10e3/uL (ref 0.9–3.3)

## 2015-08-25 LAB — COMPREHENSIVE METABOLIC PANEL
ALBUMIN: 3.6 g/dL (ref 3.5–5.0)
ALK PHOS: 124 U/L (ref 40–150)
ALT: 19 U/L (ref 0–55)
AST: 18 U/L (ref 5–34)
Anion Gap: 10 mEq/L (ref 3–11)
BILIRUBIN TOTAL: 0.37 mg/dL (ref 0.20–1.20)
BUN: 10.5 mg/dL (ref 7.0–26.0)
CO2: 29 meq/L (ref 22–29)
CREATININE: 0.8 mg/dL (ref 0.6–1.1)
Calcium: 9.6 mg/dL (ref 8.4–10.4)
Chloride: 103 mEq/L (ref 98–109)
GLUCOSE: 229 mg/dL — AB (ref 70–140)
Potassium: 3.5 mEq/L (ref 3.5–5.1)
SODIUM: 141 meq/L (ref 136–145)
TOTAL PROTEIN: 7.9 g/dL (ref 6.4–8.3)

## 2015-08-26 ENCOUNTER — Telehealth: Payer: Self-pay | Admitting: Oncology

## 2015-08-26 LAB — FOLLICLE STIMULATING HORMONE: FSH: 6.1 m[IU]/mL

## 2015-08-26 NOTE — Telephone Encounter (Signed)
pt called to r/s appt for 1/10 @ a different time-gave pt r/s time

## 2015-08-28 LAB — ESTRADIOL, ULTRA SENS: Estradiol, Ultra Sensitive: <2 pg/mL

## 2015-09-01 ENCOUNTER — Ambulatory Visit (HOSPITAL_BASED_OUTPATIENT_CLINIC_OR_DEPARTMENT_OTHER): Payer: 59

## 2015-09-01 ENCOUNTER — Ambulatory Visit: Payer: 59

## 2015-09-01 VITALS — BP 144/92 | HR 86 | Temp 98.2°F

## 2015-09-01 DIAGNOSIS — Z5111 Encounter for antineoplastic chemotherapy: Secondary | ICD-10-CM

## 2015-09-01 DIAGNOSIS — C50411 Malignant neoplasm of upper-outer quadrant of right female breast: Secondary | ICD-10-CM

## 2015-09-01 DIAGNOSIS — Z86718 Personal history of other venous thrombosis and embolism: Secondary | ICD-10-CM

## 2015-09-01 MED ORDER — GOSERELIN ACETATE 10.8 MG ~~LOC~~ IMPL
10.8000 mg | DRUG_IMPLANT | Freq: Once | SUBCUTANEOUS | Status: AC
  Administered 2015-09-01: 10.8 mg via SUBCUTANEOUS
  Filled 2015-09-01: qty 10.8

## 2015-09-07 ENCOUNTER — Other Ambulatory Visit: Payer: Self-pay | Admitting: Oncology

## 2015-09-18 ENCOUNTER — Other Ambulatory Visit: Payer: Self-pay

## 2015-09-18 MED ORDER — PANTOPRAZOLE SODIUM 40 MG PO TBEC: 40.0000 mg | DELAYED_RELEASE_TABLET | Freq: Every day | ORAL | Status: DC

## 2015-10-05 ENCOUNTER — Telehealth: Payer: Self-pay | Admitting: Oncology

## 2015-10-05 ENCOUNTER — Other Ambulatory Visit (HOSPITAL_BASED_OUTPATIENT_CLINIC_OR_DEPARTMENT_OTHER): Payer: 59

## 2015-10-05 ENCOUNTER — Ambulatory Visit (HOSPITAL_BASED_OUTPATIENT_CLINIC_OR_DEPARTMENT_OTHER): Payer: 59 | Admitting: Oncology

## 2015-10-05 VITALS — BP 142/98 | HR 79 | Temp 98.0°F | Resp 18 | Ht 67.0 in | Wt 290.5 lb

## 2015-10-05 DIAGNOSIS — Z79811 Long term (current) use of aromatase inhibitors: Secondary | ICD-10-CM

## 2015-10-05 DIAGNOSIS — Z86718 Personal history of other venous thrombosis and embolism: Secondary | ICD-10-CM

## 2015-10-05 DIAGNOSIS — E119 Type 2 diabetes mellitus without complications: Secondary | ICD-10-CM

## 2015-10-05 DIAGNOSIS — C50411 Malignant neoplasm of upper-outer quadrant of right female breast: Secondary | ICD-10-CM

## 2015-10-05 DIAGNOSIS — C50919 Malignant neoplasm of unspecified site of unspecified female breast: Secondary | ICD-10-CM

## 2015-10-05 DIAGNOSIS — C773 Secondary and unspecified malignant neoplasm of axilla and upper limb lymph nodes: Secondary | ICD-10-CM | POA: Diagnosis not present

## 2015-10-05 LAB — CBC WITH DIFFERENTIAL/PLATELET
BASO%: 0.5 % (ref 0.0–2.0)
Basophils Absolute: 0 10*3/uL (ref 0.0–0.1)
EOS%: 5 % (ref 0.0–7.0)
Eosinophils Absolute: 0.3 10*3/uL (ref 0.0–0.5)
HCT: 37 % (ref 34.8–46.6)
HGB: 11.6 g/dL (ref 11.6–15.9)
LYMPH#: 1.9 10*3/uL (ref 0.9–3.3)
LYMPH%: 32 % (ref 14.0–49.7)
MCH: 24.6 pg — ABNORMAL LOW (ref 25.1–34.0)
MCHC: 31.4 g/dL — AB (ref 31.5–36.0)
MCV: 78.6 fL — ABNORMAL LOW (ref 79.5–101.0)
MONO#: 0.6 10*3/uL (ref 0.1–0.9)
MONO%: 10.1 % (ref 0.0–14.0)
NEUT%: 52.4 % (ref 38.4–76.8)
NEUTROS ABS: 3.1 10*3/uL (ref 1.5–6.5)
Platelets: 389 10*3/uL (ref 145–400)
RBC: 4.71 10*6/uL (ref 3.70–5.45)
RDW: 13.9 % (ref 11.2–14.5)
WBC: 6 10*3/uL (ref 3.9–10.3)

## 2015-10-05 LAB — COMPREHENSIVE METABOLIC PANEL WITH GFR
ALT: 19 U/L (ref 0–55)
AST: 19 U/L (ref 5–34)
Albumin: 3.6 g/dL (ref 3.5–5.0)
Alkaline Phosphatase: 122 U/L (ref 40–150)
Anion Gap: 10 meq/L (ref 3–11)
BUN: 10.2 mg/dL (ref 7.0–26.0)
CO2: 26 meq/L (ref 22–29)
Calcium: 9.4 mg/dL (ref 8.4–10.4)
Chloride: 105 meq/L (ref 98–109)
Creatinine: 0.8 mg/dL (ref 0.6–1.1)
EGFR: >90 ml/min/1.73 m2
Glucose: 163 mg/dL — ABNORMAL HIGH (ref 70–140)
Potassium: 3.8 meq/L (ref 3.5–5.1)
Sodium: 141 meq/L (ref 136–145)
Total Bilirubin: 0.33 mg/dL (ref 0.20–1.20)
Total Protein: 7.8 g/dL (ref 6.4–8.3)

## 2015-10-05 MED ORDER — ANASTROZOLE 1 MG PO TABS: ORAL_TABLET | ORAL | Status: DC

## 2015-10-05 NOTE — Progress Notes (Signed)
ID: [name]   DOB: 24-Apr-1976  MR#: [id number]  HOZ#:[id number]  PCP: Annette Koch, MD GYN: SU: Annette Messing, MD [place]:  Annette Pray, MD OTHER MD:  Annette Loffler, MD   CHIEF COMPLAINT:  Right Breast Cancer  CURRENT THERAPY: anastrozole, goserelin  BREAST CANCER HISTORY: From the original intake note:  The patient developed pain in her right breast and brought it to her primary care physician's attention. Mammography at [facility] 03/10/2011 found the breasts to be heterogeneously dense. There was a mildly irregular mass posteriorly in the upper outer quadrant of the right breast. On ultrasound this was hypoechoic and measured approximately 2.4 cm, with irregular margins. The left breast was unremarkable.   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 Bilateral breast MRIs were obtained 03/18/2011, showing in the upper outer quadrant of the right breast an irregular mass measuring 5.5 cm. There was a suggestion of cortical thickening in the level I right axillary lymph node, which measured 1.2 cm. The patient was staged with chest CT scan and PET scan, which showed no distant disease. These studies did confirm the abnormality in the right breast as well as a hypermetabolic right axillary lymph node, making this a clinical T3 N1 or stage III invasive ductal carcinoma.   She completed neoadjuvant chemotherapy and proceeded to surgery and subsequent treatment as detailed below..  INTERVAL HISTORY: [name] returns today for follow up of her estrogen receptor positive breast cancer. She continues on anastrozole under cover of goserelin. Of course this shots are painful but aside from that she tolerates them well. Hot flashes are better. Vaginal dryness is not an issue. She obtains anastrozole for approximately $5 a  month.  REVIEW OF SYSTEMS: [name] has some mild sinus symptoms and occasional sinus headaches, for which she sometimes takes Goody powders or other mild analgesics.She is concerned about her weight. Sometimes she has slightly blurred vision. She did history of left shoulder cramps after work and then she can get her right leg cramps sometimes when she lies down at night. She drinks quite a bit of water so that it is unlikely to be the reason for this. She does have some chronic back pain which is not more intense or persistent than before a detailed review of systems today was otherwise stable  PAST MEDICAL HISTORY: Past Medical History  Diagnosis Date  . Back pain     lumbar   . Anemia   . GERD (gastroesophageal reflux disease)   . Status post chemotherapy     finished 08/2011  . H/O scoliosis   . Peripheral neuropathy     s/p chemotherapy  . Hypertension     under control with med., has been on med. x 5 yr.  . Depression     no current med.  Marland Kitchen Headache(784.0)     hx. migraine in childhood  . Breast cancer 2012    right  . Gastric ulcer   . Cough 09/07/2012  . S/P radiation therapy 12/19/11 - 02/03/12    Right Chest Wall and Axilla  . Diabetes mellitus without complication     PAST SURGICAL HISTORY: Past Surgical History  Procedure Laterality Date  . Hip pinning  age 55    bilateral  . Portacath placement  04/08/2011  . Mastectomy w/ sentinel node biopsy  10/14/2011    Procedure:  MASTECTOMY WITH SENTINEL LYMPH NODE BIOPSY;  Surgeon: Annette Roof, MD;  Location: [place];  Service: General;  Laterality: Right;  . Wound exploration  10/14/2011    right mastectomy wound  . Esophagogastroduodenoscopy (egd) with propofol  09/06/2012    Procedure: ESOPHAGOGASTRODUODENOSCOPY (EGD) WITH PROPOFOL;  Surgeon: Annette Banister, MD;  Location: [facility];  Service: Endoscopy;  Laterality: N/A;  . Port-a-cath removal  09/12/2012    Procedure: REMOVAL PORT-A-CATH;  Surgeon: Annette Roof, MD;   Location: [place];  Service: General;  Laterality: Left;    FAMILY HISTORY Family History  Problem Relation Age of Onset  . Heart disease Father     heart attack  . Heart attack Father     died age 67  . Hypertension Father   . Hypertension Mother   . Anemia Mother   . Diabetes Mother   . Hypertension Brother   . Diabetes Brother   . Heart disease Maternal Grandmother     MI  . Hypertension Maternal Grandmother   . Diabetes Maternal Grandmother   . Hypertension Paternal Grandmother   . Stroke Mother   . Aneurysm Mother     brain  . Diabetes Paternal Grandmother   [name] died from a stroke at age 55. The patient's father died from a myocardial infarction at the age of 54. She has one sister and one brother. There is no history of breast or ovarian cancer in the family.   GYNECOLOGIC HISTORY: Menarche age 63. She was premenopausal at the time of diagnosis. She has never used birth control pills. She is GX P2. First pregnancy to term age 53. She had her last period at the time of her surgery, continues on goserelin every 3 months.  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 Her 36 year old daughter [name] Currently he is working 2 jobs. She hopes to become an Chief Financial Officer.. Her other daughter, [name], is currently 59. The patient is separtated from her husband, [name], who works in [employer].     ADVANCED DIRECTIVES: not in place; at her 10/06/2014 visit the patient was given the appropriate documents 2 complete and notarize at her discretion.  HEALTH MAINTENANCE: (Updated 06/25/2013) Social History  Substance Use Topics  . Smoking status: Former Research scientist (life sciences)  . Smokeless tobacco: Never Used     Comment: quit 1998 - states only smoked for 1 month  . Alcohol Use: Yes     Comment: rarely     Colonoscopy: Never  PAP: Not on file  Bone density: 03/17/2014 t-score 0.0 (normal)  Lipid  panel: Dr. Darron Yoder    Allergies  Allergen Reactions  . Oxycodone Other (See Comments)    headache  . Tape Other (See Comments)    SKIN TEARS    Current Outpatient Prescriptions  Medication Sig Dispense Refill  . amLODipine (NORVASC) 10 MG tablet Take 1 tablet (10 mg total) by mouth daily. 90 tablet 3  . anastrozole (ARIMIDEX) 1 MG tablet TAKE ONE TABLET BY MOUTH ONCE DAILY 30 tablet 11  . B Complex-C (SUPER B COMPLEX PO) Take 1 tablet by mouth daily.    . benazepril-hydrochlorthiazide (LOTENSIN HCT) 20-12.5 MG tablet TAKE ONE TABLET BY MOUTH ONCE DAILY 30 tablet 5  . budesonide (RHINOCORT AQUA) 32 MCG/ACT nasal spray Place 2 sprays into both nostrils daily. (Patient not taking: Reported on 04/01/2015) 8.6 g 2  . goserelin (ZOLADEX) 10.8 MG  injection Inject 10.8 mg into the skin every 3 (three) months.    . metFORMIN (GLUCOPHAGE) 1000 MG tablet Take 1 tablet (1,000 mg total) by mouth 2 (two) times daily with a meal. (Patient not taking: Reported on 04/01/2015) 60 tablet 11  . naproxen sodium (ANAPROX) 220 MG tablet Take 220 mg by mouth 2 (two) times daily with a meal.    . pantoprazole (PROTONIX) 40 MG tablet Take 1 tablet (40 mg total) by mouth daily. 30 tablet 0  . ranitidine (ZANTAC) 75 MG tablet Take 75 mg by mouth as needed. ONLY IF OUT OF PROTONIX     No current facility-administered medications for this visit.   Facility-Administered Medications Ordered in Other Visits  Medication Dose Route Frequency Provider Last Rate Last Dose  . goserelin (ZOLADEX) injection 10.8 mg  10.8 mg Subcutaneous Once Chauncey Cruel, MD        OBJECTIVE: [name] American woman who appears well Filed Vitals:   10/05/15 1514  BP: 142/98  Pulse: 79  Temp: 98 F (36.7 C)  Resp: 18     Body mass index is 45.49 kg/(m^2).    ECOG FS: 0 Filed Weights   10/05/15 1514  Weight: 290 lb 8 oz (131.77 kg)   Sclerae unicteric, pupils round and equal Oropharynx clear and moist-- no thrush or  other lesions No cervical or supraclavicular adenopathy Lungs no rales or rhonchi Heart regular rate and rhythm Abd soft, obese, nontender, positive bowel sounds MSK no focal spinal tenderness, no upper extremity lymphedema Neuro: nonfocal, well oriented, appropriate affect Breasts: The right breast is status post mastectomy. There is some hyperpigmentation. There is no evidence of disease recurrence. The right axilla is benign. The left breast is unremarkable.    LAB RESULTS: Lab Results  Component Value Date   WBC 6.0 10/05/2015   NEUTROABS 3.1 10/05/2015   HGB 11.6 10/05/2015   HCT 37.0 10/05/2015   MCV 78.6* 10/05/2015   PLT 389 10/05/2015      Chemistry      Component Value Date/Time   NA 141 10/05/2015 1435   NA 141 03/24/2015 1117   K 3.8 10/05/2015 1435   K 3.8 03/24/2015 1117   CL 102 03/24/2015 1117   CL 101 12/21/2012 1019   CO2 26 10/05/2015 1435   CO2 34* 03/24/2015 1117   BUN 10.2 10/05/2015 1435   BUN 10 03/24/2015 1117   CREATININE 0.8 10/05/2015 1435   CREATININE 0.68 03/24/2015 1117      Component Value Date/Time   CALCIUM 9.4 10/05/2015 1435   CALCIUM 9.9 03/24/2015 1117   ALKPHOS 122 10/05/2015 1435   ALKPHOS 117 03/24/2015 1117   AST 19 10/05/2015 1435   AST 18 03/24/2015 1117   ALT 19 10/05/2015 1435   ALT 16 03/24/2015 1117   BILITOT 0.33 10/05/2015 1435   BILITOT 0.5 03/24/2015 1117       STUDIES: No results found.   ASSESSMENT: 40 y.o.  BRCA 1-2 negative [place] woman   (1) status post right breast upper outer quadrant biopsy July of 2012 for a clinical T3 N1 (stage III) invasive ductal carcinoma, grade 3, 99% estrogen and  100% progesterone receptor positive, HER-2 negative, with an MIB-1 of 98%.   (2) Status post 4 cycles of dose dense doxorubicin and cyclophosphamide, followed by weekly paclitaxel x 8, completed 08/25/2011  (3) s/p right mastectomy and sentinel lymph node sampling 10/14/2011 for a residual ypT2 ypN1(mic)  invasive ductal carcinoma, grade 3, with  ample margins  (4) status post radiation therapy under the care of Dr. Sondra Come,  completed in mid June.  (5)  Anti-estrogen therapy started 01/18/2012 with first Zoladex injection,  continued every 3 months. Anastrozole started in late June 2013, the goal being to continue for at least 5 years (June 2018).  (6) remote history of DVT  PLAN:  Onia is now 4 years out from her definitive surgery with no evidence of disease recurrence. This is very favorable.  I'm concerned about her morbid obesity. I offered her referral for bariatric surgery. She refuses that at this time.  We discussed diet and exercise. My strong suggestion was that she eliminate carbohydrates and concentrate on non-carbohydrate vegetables and meet. She was going to the gym and I strongly encouraged her to return to do that  As far as her diabetes is concerned she really hates to protect her finger. I suggested she perhaps perked her finger every other day and do it for breakfast one day before lunch the next and someone so she has a good idea once she'll isolate data down of our sugars are doing during the day and in the evening.  As far as her cramps or concerns she drinks quite a bit of water and her potassium is normal. I suggested she do some stretching exercises in the evening.  Otherwise the plan is to continue the goserelin every 3 months and she will see [place] again in 6 months from now. She knows to call for any problems develop before her next visit here.Marland Kitchen  Chauncey Cruel, MD     10/05/2015

## 2015-10-05 NOTE — Telephone Encounter (Signed)
Appointments made and avs printed °

## 2015-10-06 LAB — FOLLICLE STIMULATING HORMONE: FSH: 5.6 m[IU]/mL

## 2015-10-13 LAB — ESTRADIOL, ULTRA SENS

## 2015-10-19 ENCOUNTER — Other Ambulatory Visit: Payer: Self-pay | Admitting: Internal Medicine

## 2015-11-18 ENCOUNTER — Other Ambulatory Visit: Payer: Self-pay | Admitting: Internal Medicine

## 2015-11-24 ENCOUNTER — Other Ambulatory Visit (HOSPITAL_BASED_OUTPATIENT_CLINIC_OR_DEPARTMENT_OTHER): Payer: 59

## 2015-11-24 ENCOUNTER — Ambulatory Visit (HOSPITAL_BASED_OUTPATIENT_CLINIC_OR_DEPARTMENT_OTHER): Payer: 59

## 2015-11-24 VITALS — BP 114/51 | HR 78 | Temp 99.1°F

## 2015-11-24 DIAGNOSIS — C50411 Malignant neoplasm of upper-outer quadrant of right female breast: Secondary | ICD-10-CM

## 2015-11-24 DIAGNOSIS — Z5111 Encounter for antineoplastic chemotherapy: Secondary | ICD-10-CM

## 2015-11-24 DIAGNOSIS — Z86718 Personal history of other venous thrombosis and embolism: Secondary | ICD-10-CM

## 2015-11-24 LAB — CBC WITH DIFFERENTIAL/PLATELET
BASO%: 0.7 % (ref 0.0–2.0)
Basophils Absolute: 0 10*3/uL (ref 0.0–0.1)
EOS ABS: 0.3 10*3/uL (ref 0.0–0.5)
EOS%: 4.7 % (ref 0.0–7.0)
HCT: 37.2 % (ref 34.8–46.6)
HEMOGLOBIN: 11.5 g/dL — AB (ref 11.6–15.9)
LYMPH%: 22.6 % (ref 14.0–49.7)
MCH: 24 pg — ABNORMAL LOW (ref 25.1–34.0)
MCHC: 31 g/dL — ABNORMAL LOW (ref 31.5–36.0)
MCV: 77.4 fL — AB (ref 79.5–101.0)
MONO#: 0.5 10*3/uL (ref 0.1–0.9)
MONO%: 8.7 % (ref 0.0–14.0)
NEUT%: 63.3 % (ref 38.4–76.8)
NEUTROS ABS: 4 10*3/uL (ref 1.5–6.5)
PLATELETS: 385 10*3/uL (ref 145–400)
RBC: 4.81 10*6/uL (ref 3.70–5.45)
RDW: 13.8 % (ref 11.2–14.5)
WBC: 6.3 10*3/uL (ref 3.9–10.3)
lymph#: 1.4 10*3/uL (ref 0.9–3.3)

## 2015-11-24 LAB — COMPREHENSIVE METABOLIC PANEL
ALBUMIN: 3.5 g/dL (ref 3.5–5.0)
ALK PHOS: 117 U/L (ref 40–150)
ALT: 14 U/L (ref 0–55)
AST: 17 U/L (ref 5–34)
Anion Gap: 9 mEq/L (ref 3–11)
BILIRUBIN TOTAL: 0.61 mg/dL (ref 0.20–1.20)
BUN: 9.3 mg/dL (ref 7.0–26.0)
CALCIUM: 9.9 mg/dL (ref 8.4–10.4)
CO2: 29 mEq/L (ref 22–29)
Chloride: 106 mEq/L (ref 98–109)
Creatinine: 0.8 mg/dL (ref 0.6–1.1)
Glucose: 132 mg/dl (ref 70–140)
POTASSIUM: 4 meq/L (ref 3.5–5.1)
Sodium: 143 mEq/L (ref 136–145)
TOTAL PROTEIN: 7.8 g/dL (ref 6.4–8.3)

## 2015-11-24 MED ORDER — GOSERELIN ACETATE 10.8 MG ~~LOC~~ IMPL
10.8000 mg | DRUG_IMPLANT | Freq: Once | SUBCUTANEOUS | Status: AC
  Administered 2015-11-24: 10.8 mg via SUBCUTANEOUS
  Filled 2015-11-24: qty 10.8

## 2015-11-25 LAB — FOLLICLE STIMULATING HORMONE: FSH: 5.5 m[IU]/mL

## 2015-11-30 LAB — ESTRADIOL, ULTRA SENS: Estradiol, Sensitive: <2.5 pg/mL

## 2015-12-25 ENCOUNTER — Other Ambulatory Visit: Payer: Self-pay | Admitting: Internal Medicine

## 2016-01-11 ENCOUNTER — Other Ambulatory Visit: Payer: Self-pay | Admitting: Internal Medicine

## 2016-01-29 ENCOUNTER — Other Ambulatory Visit: Payer: Self-pay | Admitting: Internal Medicine

## 2016-02-11 ENCOUNTER — Other Ambulatory Visit: Payer: Self-pay | Admitting: Internal Medicine

## 2016-02-16 ENCOUNTER — Ambulatory Visit (HOSPITAL_BASED_OUTPATIENT_CLINIC_OR_DEPARTMENT_OTHER): Payer: 59

## 2016-02-16 ENCOUNTER — Other Ambulatory Visit (HOSPITAL_BASED_OUTPATIENT_CLINIC_OR_DEPARTMENT_OTHER): Payer: 59

## 2016-02-16 VITALS — BP 133/87 | HR 80 | Temp 98.3°F | Resp 20

## 2016-02-16 DIAGNOSIS — Z5111 Encounter for antineoplastic chemotherapy: Secondary | ICD-10-CM

## 2016-02-16 DIAGNOSIS — C50411 Malignant neoplasm of upper-outer quadrant of right female breast: Secondary | ICD-10-CM | POA: Diagnosis not present

## 2016-02-16 DIAGNOSIS — Z86718 Personal history of other venous thrombosis and embolism: Secondary | ICD-10-CM

## 2016-02-16 LAB — CBC WITH DIFFERENTIAL/PLATELET
BASO%: 1.1 % (ref 0.0–2.0)
Basophils Absolute: 0.1 10*3/uL (ref 0.0–0.1)
EOS%: 4 % (ref 0.0–7.0)
Eosinophils Absolute: 0.2 10*3/uL (ref 0.0–0.5)
HEMATOCRIT: 35.8 % (ref 34.8–46.6)
HEMOGLOBIN: 11.2 g/dL — AB (ref 11.6–15.9)
LYMPH#: 1.5 10*3/uL (ref 0.9–3.3)
LYMPH%: 26.1 % (ref 14.0–49.7)
MCH: 23.7 pg — ABNORMAL LOW (ref 25.1–34.0)
MCHC: 31.2 g/dL — AB (ref 31.5–36.0)
MCV: 75.7 fL — ABNORMAL LOW (ref 79.5–101.0)
MONO#: 0.5 10*3/uL (ref 0.1–0.9)
MONO%: 9.1 % (ref 0.0–14.0)
NEUT#: 3.4 10*3/uL (ref 1.5–6.5)
NEUT%: 59.7 % (ref 38.4–76.8)
Platelets: 370 10*3/uL (ref 145–400)
RBC: 4.72 10*6/uL (ref 3.70–5.45)
RDW: 13.9 % (ref 11.2–14.5)
WBC: 5.7 10*3/uL (ref 3.9–10.3)

## 2016-02-16 LAB — COMPREHENSIVE METABOLIC PANEL
ALBUMIN: 3.5 g/dL (ref 3.5–5.0)
ALK PHOS: 129 U/L (ref 40–150)
ALT: 20 U/L (ref 0–55)
ANION GAP: 10 meq/L (ref 3–11)
AST: 23 U/L (ref 5–34)
BILIRUBIN TOTAL: 0.62 mg/dL (ref 0.20–1.20)
BUN: 9.2 mg/dL (ref 7.0–26.0)
CO2: 26 mEq/L (ref 22–29)
Calcium: 9.7 mg/dL (ref 8.4–10.4)
Chloride: 105 mEq/L (ref 98–109)
Creatinine: 0.8 mg/dL (ref 0.6–1.1)
Glucose: 132 mg/dl (ref 70–140)
Potassium: 3.6 mEq/L (ref 3.5–5.1)
SODIUM: 141 meq/L (ref 136–145)
TOTAL PROTEIN: 7.6 g/dL (ref 6.4–8.3)

## 2016-02-16 MED ORDER — GOSERELIN ACETATE 10.8 MG ~~LOC~~ IMPL
10.8000 mg | DRUG_IMPLANT | Freq: Once | SUBCUTANEOUS | Status: AC
Start: 2016-02-16 — End: 2016-02-16
  Administered 2016-02-16: 10.8 mg via SUBCUTANEOUS
  Filled 2016-02-16: qty 10.8

## 2016-02-16 NOTE — Patient Instructions (Signed)
Goserelin injection What is this medicine? GOSERELIN (GOE se rel in) is similar to a hormone found in the body. It lowers the amount of sex hormones that the body makes. Men will have lower testosterone levels and women will have lower estrogen levels while taking this medicine. In men, this medicine is used to treat prostate cancer; the injection is either given once per month or once every 12 weeks. A once per month injection (only) is used to treat women with endometriosis, dysfunctional uterine bleeding, or advanced breast cancer. This medicine may be used for other purposes; ask your health care provider or pharmacist if you have questions. COMMON BRAND NAME(S): Zoladex What should I tell my health care provider before I take this medicine? They need to know if you have any of these conditions (some only apply to women): -diabetes -heart disease or previous heart attack -high blood pressure -high cholesterol -kidney disease -osteoporosis or low bone density -problems passing urine -spinal cord injury -stroke -tobacco smoker -an unusual or allergic reaction to goserelin, hormone therapy, other medicines, foods, dyes, or preservatives -pregnant or trying to get pregnant -breast-feeding How should I use this medicine? This medicine is for injection under the skin. It is given by a health care professional in a hospital or clinic setting. Men receive this injection once every 4 weeks or once every 12 weeks. Women will only receive the once every 4 weeks injection. Talk to your pediatrician regarding the use of this medicine in children. Special care may be needed. Overdosage: If you think you have taken too much of this medicine contact a poison control center or emergency room at once. NOTE: This medicine is only for you. Do not share this medicine with others. What if I miss a dose? It is important not to miss your dose. Call your doctor or health care professional if you are unable to  keep an appointment. What may interact with this medicine? -female hormones like estrogen -herbal or dietary supplements like black cohosh, chasteberry, or DHEA -female hormones like testosterone -prasterone This list may not describe all possible interactions. Give your health care provider a list of all the medicines, herbs, non-prescription drugs, or dietary supplements you use. Also tell them if you smoke, drink alcohol, or use illegal drugs. Some items may interact with your medicine. What should I watch for while using this medicine? Visit your doctor or health care professional for regular checks on your progress. Your symptoms may appear to get worse during the first weeks of this therapy. Tell your doctor or healthcare professional if your symptoms do not start to get better or if they get worse after this time. Your bones may get weaker if you take this medicine for a long time. If you smoke or frequently drink alcohol you may increase your risk of bone loss. A family history of osteoporosis, chronic use of drugs for seizures (convulsions), or corticosteroids can also increase your risk of bone loss. Talk to your doctor about how to keep your bones strong. This medicine should stop regular monthly menstration in women. Tell your doctor if you continue to menstrate. Women should not become pregnant while taking this medicine or for 12 weeks after stopping this medicine. Women should inform their doctor if they wish to become pregnant or think they might be pregnant. There is a potential for serious side effects to an unborn child. Talk to your health care professional or pharmacist for more information. Do not breast-feed an infant while taking   this medicine. Men should inform their doctors if they wish to father a child. This medicine may lower sperm counts. Talk to your health care professional or pharmacist for more information. What side effects may I notice from receiving this  medicine? Side effects that you should report to your doctor or health care professional as soon as possible: -allergic reactions like skin rash, itching or hives, swelling of the face, lips, or tongue -bone pain -breathing problems -changes in vision -chest pain -feeling faint or lightheaded, falls -fever, chills -pain, swelling, warmth in the leg -pain, tingling, numbness in the hands or feet -signs and symptoms of low blood pressure like dizziness; feeling faint or lightheaded, falls; unusually weak or tired -stomach pain -swelling of the ankles, feet, hands -trouble passing urine or change in the amount of urine -unusually high or low blood pressure -unusually weak or tired Side effects that usually do not require medical attention (report to your doctor or health care professional if they continue or are bothersome): -change in sex drive or performance -changes in breast size in both males and females -changes in emotions or moods -headache -hot flashes -irritation at site where injected -loss of appetite -skin problems like acne, dry skin -vaginal dryness This list may not describe all possible side effects. Call your doctor for medical advice about side effects. You may report side effects to FDA at 1-800-FDA-1088. Where should I keep my medicine? This drug is given in a hospital or clinic and will not be stored at home. NOTE: This sheet is a summary. It may not cover all possible information. If you have questions about this medicine, talk to your doctor, pharmacist, or health care provider.  2015, Elsevier/Gold Standard. (2013-10-15 11:10:35)  

## 2016-02-17 LAB — FOLLICLE STIMULATING HORMONE: FSH: 5.2 m[IU]/mL

## 2016-02-22 LAB — ESTRADIOL, ULTRA SENS

## 2016-03-03 ENCOUNTER — Ambulatory Visit: Payer: 59 | Admitting: Oncology

## 2016-03-04 ENCOUNTER — Encounter: Payer: Self-pay | Admitting: Genetic Counselor

## 2016-03-05 ENCOUNTER — Other Ambulatory Visit: Payer: Self-pay | Admitting: Internal Medicine

## 2016-03-18 ENCOUNTER — Other Ambulatory Visit: Payer: Self-pay | Admitting: Internal Medicine

## 2016-03-31 ENCOUNTER — Telehealth: Payer: Self-pay | Admitting: Oncology

## 2016-03-31 ENCOUNTER — Ambulatory Visit (HOSPITAL_BASED_OUTPATIENT_CLINIC_OR_DEPARTMENT_OTHER): Payer: 59 | Admitting: Oncology

## 2016-03-31 VITALS — BP 140/98 | HR 95 | Temp 98.0°F | Resp 18 | Ht 67.0 in | Wt 279.5 lb

## 2016-03-31 DIAGNOSIS — C50919 Malignant neoplasm of unspecified site of unspecified female breast: Secondary | ICD-10-CM

## 2016-03-31 DIAGNOSIS — Z79811 Long term (current) use of aromatase inhibitors: Secondary | ICD-10-CM | POA: Diagnosis not present

## 2016-03-31 DIAGNOSIS — C50411 Malignant neoplasm of upper-outer quadrant of right female breast: Secondary | ICD-10-CM | POA: Diagnosis not present

## 2016-03-31 MED ORDER — ANASTROZOLE 1 MG PO TABS: ORAL_TABLET | ORAL | 11 refills | Status: DC

## 2016-03-31 MED ORDER — BENAZEPRIL-HYDROCHLOROTHIAZIDE 20-12.5 MG PO TABS: 1.0000 | ORAL_TABLET | Freq: Every day | ORAL | 4 refills | Status: DC

## 2016-03-31 MED ORDER — PANTOPRAZOLE SODIUM 40 MG PO TBEC: 40.0000 mg | DELAYED_RELEASE_TABLET | Freq: Every day | ORAL | 4 refills | Status: DC

## 2016-03-31 NOTE — Telephone Encounter (Signed)
per of to sch pt appt-gave pt copy of avs °

## 2016-03-31 NOTE — Progress Notes (Signed)
ID: [name]   DOB: 22-Jun-1976  MR#: [id number]  IPJ#:[id number]  PCP: Annette Koch, MD GYN: SU: Annette Messing, MD [place]:  Gery Pray, MD OTHER MD:  Owens Loffler, MD   CHIEF COMPLAINT:  Right Breast Cancer  CURRENT THERAPY: anastrozole, goserelin  BREAST CANCER HISTORY: From the original intake note:  The patient developed pain in her right breast and brought it to her primary care physician's attention. Mammography at [facility] 03/10/2011 found the breasts to be heterogeneously dense. There was a mildly irregular mass posteriorly in the upper outer quadrant of the right breast. On ultrasound this was hypoechoic and measured approximately 2.4 cm, with irregular margins. The left breast was unremarkable.   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 Bilateral breast MRIs were obtained 03/18/2011, showing in the upper outer quadrant of the right breast an irregular mass measuring 5.5 cm. There was a suggestion of cortical thickening in the level I right axillary lymph node, which measured 1.2 cm. The patient was staged with chest CT scan and PET scan, which showed no distant disease. These studies did confirm the abnormality in the right breast as well as a hypermetabolic right axillary lymph node, making this a clinical T3 N1 or stage III invasive ductal carcinoma.   She completed neoadjuvant chemotherapy and proceeded to surgery and subsequent treatment as detailed below..  INTERVAL HISTORY: [name] returns today for follow up of her breast cancer. She takes anastrozole daily, with good tolerance. Hot flashes and vaginal dryness are not a major issue. She never developed the arthralgias or myalgias that many patients can experience on this medication. She obtains it at a good price. She is also on goserelin every 3  months. We just checked her Kraemer and estradiol levels and they're very favorable 5.2 and less than 2.5 respectively).  She is currently under a lot of stress because her daughter attempted suicide, with Tylenol, and is currently in [employer]. This means [name] had 2 quit 1 job and go on leave of absence from her other job, because she has a 31-monthold, her nephew, which she has no one else to care for. [place] She is hoping perhaps the child can get back with his mother in a week or 2.  REVIEW OF SYSTEMS: Sometimes a week or 2 after getting the goserelin she notices a bump on her abdomen. This usually resolves but not always. She thinks the right anterior chest near the shoulder is a little swollen and she worries about that. She has some pain in her left breast. She's had mild headaches on and off for the past few days, which she attributes, probably correctly, distress. She has some ankle swelling which is not new. She feels forgetful, but not depressed. A detailed review of systems was otherwise stable.  PAST MEDICAL HISTORY: Past Medical History:  Diagnosis Date  . Anemia   . Back pain    lumbar   . Breast cancer 2012   right  . Cough 09/07/2012  . Depression    no current med.  . Diabetes mellitus without complication   . Gastric ulcer   . GERD (gastroesophageal reflux disease)   . H/O scoliosis   . Headache(784.0)    hx. migraine in childhood  . Hypertension    under control with med., has been  on med. x 5 yr.  . Peripheral neuropathy    s/p chemotherapy  . S/P radiation therapy 12/19/11 - 02/03/12   Right Chest Wall and Axilla  . Status post chemotherapy    finished 08/2011    PAST SURGICAL HISTORY: Past Surgical History:  Procedure Laterality Date  . ESOPHAGOGASTRODUODENOSCOPY (EGD) WITH PROPOFOL  09/06/2012   Procedure: ESOPHAGOGASTRODUODENOSCOPY (EGD) WITH PROPOFOL;  Surgeon: Milus Banister, MD;  Location: [facility];  Service: Endoscopy;   Laterality: N/A;  . HIP PINNING  age 40   bilateral  . MASTECTOMY W/ SENTINEL NODE BIOPSY  10/14/2011   Procedure: MASTECTOMY WITH SENTINEL LYMPH NODE BIOPSY;  Surgeon: Merrie Roof, MD;  Location: [place];  Service: General;  Laterality: Right;  . PORT-A-CATH REMOVAL  09/12/2012   Procedure: REMOVAL PORT-A-CATH;  Surgeon: Merrie Roof, MD;  Location: [place];  Service: General;  Laterality: Left;  . PORTACATH PLACEMENT  04/08/2011  . WOUND EXPLORATION  10/14/2011   right mastectomy wound    FAMILY HISTORY Family History  Problem Relation Age of Onset  . Heart disease Father     heart attack  . Heart attack Father     died age 21  . Hypertension Father   . Hypertension Mother   . Anemia Mother   . Diabetes Mother   . Hypertension Brother   . Diabetes Brother   . Heart disease Maternal Grandmother     MI  . Hypertension Maternal Grandmother   . Diabetes Maternal Grandmother   . Hypertension Paternal Grandmother   . Stroke Mother   . Aneurysm Mother     brain  . Diabetes Paternal Grandmother   [name] died from a stroke at age 67. The patient's father died from a myocardial infarction at the age of 39. She has one sister and one brother. There is no history of breast or ovarian cancer in the family.   GYNECOLOGIC HISTORY: Menarche age 80. She was premenopausal at the time of diagnosis. She has never used birth control pills. She is GX P2. First pregnancy to term age 47. She had her last period at the time of her surgery, continues on goserelin every 3 months.  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 Her 18 year old daughter [name] Currently he is working 2 jobs. She hopes to become an Chief Financial Officer.. Her other daughter, [name], is currently 89. The patient is separtated from her husband, [name], who works in [employer].     ADVANCED DIRECTIVES: not in place; at her 10/06/2014  visit the patient was given the appropriate documents 2 complete and notarize at her discretion.  HEALTH MAINTENANCE: (Updated 06/25/2013) Social History  Substance Use Topics  . Smoking status: Former Research scientist (life sciences)  . Smokeless tobacco: Never Used     Comment: quit 1998 - states only smoked for 1 month  . Alcohol use Yes     Comment: rarely     Colonoscopy: Never  PAP: Not on file  Bone density: 03/17/2014 t-score 0.0 (normal)  Lipid panel: Dr. Darron Doom    Allergies  Allergen Reactions  . Oxycodone Other (See Comments)    headache  . Tape Other (See Comments)    SKIN TEARS    Current Outpatient Prescriptions  Medication Sig Dispense Refill  . amLODipine (NORVASC) 10 MG tablet Take 1 tablet (10 mg total) by mouth daily. 90 tablet 3  . anastrozole (ARIMIDEX) 1  MG tablet TAKE ONE TABLET BY MOUTH ONCE DAILY 30 tablet 11  . B Complex-C (SUPER B COMPLEX PO) Take 1 tablet by mouth daily.    . benazepril-hydrochlorthiazide (LOTENSIN HCT) 20-12.5 MG tablet Take 1 tablet by mouth daily. 90 tablet 4  . budesonide (RHINOCORT AQUA) 32 MCG/ACT nasal spray Place 2 sprays into both nostrils daily. (Patient not taking: Reported on 04/01/2015) 8.6 g 2  . goserelin (ZOLADEX) 10.8 MG injection Inject 10.8 mg into the skin every 3 (three) months.    . metFORMIN (GLUCOPHAGE) 1000 MG tablet Take 1 tablet (1,000 mg total) by mouth 2 (two) times daily with a meal. 60 tablet 11  . naproxen sodium (ANAPROX) 220 MG tablet Take 220 mg by mouth 2 (two) times daily with a meal.    . pantoprazole (PROTONIX) 40 MG tablet Take 1 tablet (40 mg total) by mouth daily. 90 tablet 4  . ranitidine (ZANTAC) 75 MG tablet Take 75 mg by mouth as needed. ONLY IF OUT OF PROTONIX     No current facility-administered medications for this visit.     OBJECTIVE: Young African American woman Who appears stated age 40:   03/31/16 1401 03/31/16 1409  BP: (!) 137/103 (!) 140/98  Pulse: 95   Resp: 18   Temp: 98 F (36.7 C)       Body mass index is 43.78 kg/m.    ECOG FS: 0 Filed Weights   03/31/16 1401  Weight: 279 lb 8 oz (126.8 kg)   Sclerae unicteric, EOMs intact Oropharynx clear and moist No cervical or supraclavicular adenopathy Lungs no rales or rhonchi Heart regular rate and rhythm Abd soft, nontender, positive bowel sounds MSK no focal spinal tenderness, no upper extremity lymphedema; no obvious swelling or abnormality in the upper anterior chest where she feels some subjective difference  Neuro: nonfocal, well oriented, appropriate affect Breasts: The right breast is status post mastectomy and radiation. There is residual hyperpigmentation but no evidence of local recurrence. The right axilla is benign. The left breast is unremarkable.     LAB RESULTS: Lab Results  Component Value Date   WBC 5.7 02/16/2016   NEUTROABS 3.4 02/16/2016   HGB 11.2 (L) 02/16/2016   HCT 35.8 02/16/2016   MCV 75.7 (L) 02/16/2016   PLT 370 02/16/2016      Chemistry      Component Value Date/Time   NA 141 02/16/2016 1425   K 3.6 02/16/2016 1425   CL 102 03/24/2015 1117   CL 101 12/21/2012 1019   CO2 26 02/16/2016 1425   BUN 9.2 02/16/2016 1425   CREATININE 0.8 02/16/2016 1425      Component Value Date/Time   CALCIUM 9.7 02/16/2016 1425   ALKPHOS 129 02/16/2016 1425   AST 23 02/16/2016 1425   ALT 20 02/16/2016 1425   BILITOT 0.62 02/16/2016 1425       STUDIES: No results found.   ASSESSMENT: 40 y.o.  BRCA 1-2 negative [place] woman   (1) status post right breast upper outer quadrant biopsy July of 2012 for a clinical T3 N1 (stage III) invasive ductal carcinoma, grade 3, 99% estrogen and  100% progesterone receptor positive, HER-2 negative, with an MIB-1 of 98%.   (2) Status post 4 cycles of dose dense doxorubicin and cyclophosphamide, followed by weekly paclitaxel x 8, completed 08/25/2011  (3) s/p right mastectomy and sentinel lymph node sampling 10/14/2011 for a residual ypT2 ypN1(mic)  invasive ductal carcinoma, grade 3, with ample margins  (4) status post  radiation therapy under the care of Dr. Sondra Come,  completed in mid June.  (5)  Anti-estrogen therapy started 01/18/2012 with first Zoladex injection,  continued every 3 months. Anastrozole started in late June 2013, the goal being to continue for at least 5 years (June 2018).  (6) remote history of DVT  PLAN:  Evelynne isGoing through a difficult emotional. Which is also affecting her financial. I suggested she could talk to our social workers and perhaps they could arrange for daycare for her nephew so she could get back to work. He thinks she can manage this on her own,  however.  From a breast cancer point of view she is doing very well. She is now more than 4 years out from definitive surgery for her breast cancer with no evidence of disease recurrence.  This is very favorable.  She will receive her next goserelin dose 05/10/2016. I will see her with her December goserelin treatment. She will let us know if we can be of any assistance between now and then.  Chauncey Cruel, MD     03/31/2016

## 2016-05-10 ENCOUNTER — Ambulatory Visit: Payer: 59

## 2016-05-10 ENCOUNTER — Telehealth: Payer: Self-pay | Admitting: *Deleted

## 2016-05-10 ENCOUNTER — Other Ambulatory Visit: Payer: 59

## 2016-05-10 ENCOUNTER — Other Ambulatory Visit: Payer: Self-pay | Admitting: *Deleted

## 2016-05-10 NOTE — Telephone Encounter (Signed)
This RN was notified by pharmacy that authorization for Zoladex expired on 04/11/2016 - managed care states it will take several days to obtain new authorization.  Noted pt is scheduled to come in today for injection- and appointment will need to be rescheduled to next week.  Request is for nursing to call " it would be better coming from nursing "  This RN attempted to call pt at home number and obtained no answer x 15 + rings - second attempt made with same outcome. Mobile/work number called and received notice " not a working number "  This RN sent a STAT scheduling request to reschedule as well as notified injection room nurse of above.

## 2016-05-17 ENCOUNTER — Ambulatory Visit (HOSPITAL_BASED_OUTPATIENT_CLINIC_OR_DEPARTMENT_OTHER): Payer: 59

## 2016-05-17 VITALS — BP 145/80 | HR 100 | Temp 97.6°F | Resp 18

## 2016-05-17 DIAGNOSIS — Z5111 Encounter for antineoplastic chemotherapy: Secondary | ICD-10-CM

## 2016-05-17 DIAGNOSIS — C50411 Malignant neoplasm of upper-outer quadrant of right female breast: Secondary | ICD-10-CM

## 2016-05-17 DIAGNOSIS — Z86718 Personal history of other venous thrombosis and embolism: Secondary | ICD-10-CM

## 2016-05-17 MED ORDER — GOSERELIN ACETATE 10.8 MG ~~LOC~~ IMPL
10.8000 mg | DRUG_IMPLANT | Freq: Once | SUBCUTANEOUS | Status: AC
  Administered 2016-05-17: 10.8 mg via SUBCUTANEOUS
  Filled 2016-05-17: qty 10.8

## 2016-05-17 NOTE — Patient Instructions (Signed)
Goserelin injection What is this medicine? GOSERELIN (GOE se rel in) is similar to a hormone found in the body. It lowers the amount of sex hormones that the body makes. Men will have lower testosterone levels and women will have lower estrogen levels while taking this medicine. In men, this medicine is used to treat prostate cancer; the injection is either given once per month or once every 12 weeks. A once per month injection (only) is used to treat women with endometriosis, dysfunctional uterine bleeding, or advanced breast cancer. This medicine may be used for other purposes; ask your health care provider or pharmacist if you have questions. COMMON BRAND NAME(S): Zoladex What should I tell my health care provider before I take this medicine? They need to know if you have any of these conditions (some only apply to women): -diabetes -heart disease or previous heart attack -high blood pressure -high cholesterol -kidney disease -osteoporosis or low bone density -problems passing urine -spinal cord injury -stroke -tobacco smoker -an unusual or allergic reaction to goserelin, hormone therapy, other medicines, foods, dyes, or preservatives -pregnant or trying to get pregnant -breast-feeding How should I use this medicine? This medicine is for injection under the skin. It is given by a health care professional in a hospital or clinic setting. Men receive this injection once every 4 weeks or once every 12 weeks. Women will only receive the once every 4 weeks injection. Talk to your pediatrician regarding the use of this medicine in children. Special care may be needed. Overdosage: If you think you have taken too much of this medicine contact a poison control center or emergency room at once. NOTE: This medicine is only for you. Do not share this medicine with others. What if I miss a dose? It is important not to miss your dose. Call your doctor or health care professional if you are unable to  keep an appointment. What may interact with this medicine? -female hormones like estrogen -herbal or dietary supplements like black cohosh, chasteberry, or DHEA -female hormones like testosterone -prasterone This list may not describe all possible interactions. Give your health care provider a list of all the medicines, herbs, non-prescription drugs, or dietary supplements you use. Also tell them if you smoke, drink alcohol, or use illegal drugs. Some items may interact with your medicine. What should I watch for while using this medicine? Visit your doctor or health care professional for regular checks on your progress. Your symptoms may appear to get worse during the first weeks of this therapy. Tell your doctor or healthcare professional if your symptoms do not start to get better or if they get worse after this time. Your bones may get weaker if you take this medicine for a long time. If you smoke or frequently drink alcohol you may increase your risk of bone loss. A family history of osteoporosis, chronic use of drugs for seizures (convulsions), or corticosteroids can also increase your risk of bone loss. Talk to your doctor about how to keep your bones strong. This medicine should stop regular monthly menstration in women. Tell your doctor if you continue to menstrate. Women should not become pregnant while taking this medicine or for 12 weeks after stopping this medicine. Women should inform their doctor if they wish to become pregnant or think they might be pregnant. There is a potential for serious side effects to an unborn child. Talk to your health care professional or pharmacist for more information. Do not breast-feed an infant while taking   this medicine. Men should inform their doctors if they wish to father a child. This medicine may lower sperm counts. Talk to your health care professional or pharmacist for more information. What side effects may I notice from receiving this  medicine? Side effects that you should report to your doctor or health care professional as soon as possible: -allergic reactions like skin rash, itching or hives, swelling of the face, lips, or tongue -bone pain -breathing problems -changes in vision -chest pain -feeling faint or lightheaded, falls -fever, chills -pain, swelling, warmth in the leg -pain, tingling, numbness in the hands or feet -signs and symptoms of low blood pressure like dizziness; feeling faint or lightheaded, falls; unusually weak or tired -stomach pain -swelling of the ankles, feet, hands -trouble passing urine or change in the amount of urine -unusually high or low blood pressure -unusually weak or tired Side effects that usually do not require medical attention (report to your doctor or health care professional if they continue or are bothersome): -change in sex drive or performance -changes in breast size in both males and females -changes in emotions or moods -headache -hot flashes -irritation at site where injected -loss of appetite -skin problems like acne, dry skin -vaginal dryness This list may not describe all possible side effects. Call your doctor for medical advice about side effects. You may report side effects to FDA at 1-800-FDA-1088. Where should I keep my medicine? This drug is given in a hospital or clinic and will not be stored at home. NOTE: This sheet is a summary. It may not cover all possible information. If you have questions about this medicine, talk to your doctor, pharmacist, or health care provider.  2015, Elsevier/Gold Standard. (2013-10-15 11:10:35)  

## 2016-05-18 ENCOUNTER — Other Ambulatory Visit: Payer: Self-pay | Admitting: Oncology

## 2016-05-18 DIAGNOSIS — C50411 Malignant neoplasm of upper-outer quadrant of right female breast: Secondary | ICD-10-CM

## 2016-06-05 ENCOUNTER — Other Ambulatory Visit: Payer: Self-pay | Admitting: Internal Medicine

## 2016-06-23 ENCOUNTER — Other Ambulatory Visit: Payer: 59

## 2016-06-23 ENCOUNTER — Ambulatory Visit: Payer: 59

## 2016-08-04 ENCOUNTER — Other Ambulatory Visit: Payer: 59

## 2016-08-04 ENCOUNTER — Ambulatory Visit: Payer: 59 | Admitting: Oncology

## 2016-08-08 ENCOUNTER — Other Ambulatory Visit: Payer: Self-pay | Admitting: *Deleted

## 2016-08-08 DIAGNOSIS — C50411 Malignant neoplasm of upper-outer quadrant of right female breast: Secondary | ICD-10-CM

## 2016-08-09 ENCOUNTER — Other Ambulatory Visit (HOSPITAL_BASED_OUTPATIENT_CLINIC_OR_DEPARTMENT_OTHER): Payer: 59

## 2016-08-09 ENCOUNTER — Ambulatory Visit (HOSPITAL_BASED_OUTPATIENT_CLINIC_OR_DEPARTMENT_OTHER): Payer: 59

## 2016-08-09 ENCOUNTER — Ambulatory Visit (HOSPITAL_BASED_OUTPATIENT_CLINIC_OR_DEPARTMENT_OTHER): Payer: 59 | Admitting: Oncology

## 2016-08-09 VITALS — BP 134/81 | HR 99 | Temp 98.5°F | Resp 18 | Ht 67.0 in | Wt 275.1 lb

## 2016-08-09 DIAGNOSIS — C50411 Malignant neoplasm of upper-outer quadrant of right female breast: Secondary | ICD-10-CM

## 2016-08-09 DIAGNOSIS — Z86718 Personal history of other venous thrombosis and embolism: Secondary | ICD-10-CM | POA: Diagnosis not present

## 2016-08-09 DIAGNOSIS — Z5111 Encounter for antineoplastic chemotherapy: Secondary | ICD-10-CM | POA: Diagnosis not present

## 2016-08-09 DIAGNOSIS — Z170 Estrogen receptor positive status [ER+]: Secondary | ICD-10-CM | POA: Diagnosis not present

## 2016-08-09 LAB — CBC WITH DIFFERENTIAL/PLATELET
BASO%: 0.7 % (ref 0.0–2.0)
Basophils Absolute: 0 10*3/uL (ref 0.0–0.1)
EOS ABS: 0.2 10*3/uL (ref 0.0–0.5)
EOS%: 3.1 % (ref 0.0–7.0)
HCT: 37 % (ref 34.8–46.6)
HGB: 11.5 g/dL — ABNORMAL LOW (ref 11.6–15.9)
LYMPH%: 27.9 % (ref 14.0–49.7)
MCH: 23.7 pg — ABNORMAL LOW (ref 25.1–34.0)
MCHC: 31 g/dL — ABNORMAL LOW (ref 31.5–36.0)
MCV: 76.7 fL — AB (ref 79.5–101.0)
MONO#: 0.5 10*3/uL (ref 0.1–0.9)
MONO%: 7.3 % (ref 0.0–14.0)
NEUT%: 61 % (ref 38.4–76.8)
NEUTROS ABS: 4.1 10*3/uL (ref 1.5–6.5)
PLATELETS: 358 10*3/uL (ref 145–400)
RBC: 4.83 10*6/uL (ref 3.70–5.45)
RDW: 13.7 % (ref 11.2–14.5)
WBC: 6.7 10*3/uL (ref 3.9–10.3)
lymph#: 1.9 10*3/uL (ref 0.9–3.3)

## 2016-08-09 LAB — COMPREHENSIVE METABOLIC PANEL
ALK PHOS: 141 U/L (ref 40–150)
ALT: 22 U/L (ref 0–55)
ANION GAP: 9 meq/L (ref 3–11)
AST: 19 U/L (ref 5–34)
Albumin: 3.4 g/dL — ABNORMAL LOW (ref 3.5–5.0)
BILIRUBIN TOTAL: 0.49 mg/dL (ref 0.20–1.20)
BUN: 12.2 mg/dL (ref 7.0–26.0)
CO2: 27 mEq/L (ref 22–29)
Calcium: 9.7 mg/dL (ref 8.4–10.4)
Chloride: 103 mEq/L (ref 98–109)
Creatinine: 0.9 mg/dL (ref 0.6–1.1)
Glucose: 341 mg/dl — ABNORMAL HIGH (ref 70–140)
Potassium: 3.8 mEq/L (ref 3.5–5.1)
Sodium: 140 mEq/L (ref 136–145)
TOTAL PROTEIN: 7.7 g/dL (ref 6.4–8.3)

## 2016-08-09 MED ORDER — GOSERELIN ACETATE 10.8 MG ~~LOC~~ IMPL
10.8000 mg | DRUG_IMPLANT | Freq: Once | SUBCUTANEOUS | Status: AC
  Administered 2016-08-09: 10.8 mg via SUBCUTANEOUS
  Filled 2016-08-09: qty 10.8

## 2016-08-09 MED ORDER — ANASTROZOLE 1 MG PO TABS: ORAL_TABLET | ORAL | 11 refills | Status: DC

## 2016-08-09 NOTE — Patient Instructions (Signed)
Goserelin injection What is this medicine? GOSERELIN (GOE se rel in) is similar to a hormone found in the body. It lowers the amount of sex hormones that the body makes. Men will have lower testosterone levels and women will have lower estrogen levels while taking this medicine. In men, this medicine is used to treat prostate cancer; the injection is either given once per month or once every 12 weeks. A once per month injection (only) is used to treat women with endometriosis, dysfunctional uterine bleeding, or advanced breast cancer. This medicine may be used for other purposes; ask your health care provider or pharmacist if you have questions. COMMON BRAND NAME(S): Zoladex What should I tell my health care provider before I take this medicine? They need to know if you have any of these conditions (some only apply to women): -diabetes -heart disease or previous heart attack -high blood pressure -high cholesterol -kidney disease -osteoporosis or low bone density -problems passing urine -spinal cord injury -stroke -tobacco smoker -an unusual or allergic reaction to goserelin, hormone therapy, other medicines, foods, dyes, or preservatives -pregnant or trying to get pregnant -breast-feeding How should I use this medicine? This medicine is for injection under the skin. It is given by a health care professional in a hospital or clinic setting. Men receive this injection once every 4 weeks or once every 12 weeks. Women will only receive the once every 4 weeks injection. Talk to your pediatrician regarding the use of this medicine in children. Special care may be needed. Overdosage: If you think you have taken too much of this medicine contact a poison control center or emergency room at once. NOTE: This medicine is only for you. Do not share this medicine with others. What if I miss a dose? It is important not to miss your dose. Call your doctor or health care professional if you are unable to  keep an appointment. What may interact with this medicine? -female hormones like estrogen -herbal or dietary supplements like black cohosh, chasteberry, or DHEA -female hormones like testosterone -prasterone This list may not describe all possible interactions. Give your health care provider a list of all the medicines, herbs, non-prescription drugs, or dietary supplements you use. Also tell them if you smoke, drink alcohol, or use illegal drugs. Some items may interact with your medicine. What should I watch for while using this medicine? Visit your doctor or health care professional for regular checks on your progress. Your symptoms may appear to get worse during the first weeks of this therapy. Tell your doctor or healthcare professional if your symptoms do not start to get better or if they get worse after this time. Your bones may get weaker if you take this medicine for a long time. If you smoke or frequently drink alcohol you may increase your risk of bone loss. A family history of osteoporosis, chronic use of drugs for seizures (convulsions), or corticosteroids can also increase your risk of bone loss. Talk to your doctor about how to keep your bones strong. This medicine should stop regular monthly menstration in women. Tell your doctor if you continue to menstrate. Women should not become pregnant while taking this medicine or for 12 weeks after stopping this medicine. Women should inform their doctor if they wish to become pregnant or think they might be pregnant. There is a potential for serious side effects to an unborn child. Talk to your health care professional or pharmacist for more information. Do not breast-feed an infant while taking   this medicine. Men should inform their doctors if they wish to father a child. This medicine may lower sperm counts. Talk to your health care professional or pharmacist for more information. What side effects may I notice from receiving this  medicine? Side effects that you should report to your doctor or health care professional as soon as possible: -allergic reactions like skin rash, itching or hives, swelling of the face, lips, or tongue -bone pain -breathing problems -changes in vision -chest pain -feeling faint or lightheaded, falls -fever, chills -pain, swelling, warmth in the leg -pain, tingling, numbness in the hands or feet -signs and symptoms of low blood pressure like dizziness; feeling faint or lightheaded, falls; unusually weak or tired -stomach pain -swelling of the ankles, feet, hands -trouble passing urine or change in the amount of urine -unusually high or low blood pressure -unusually weak or tired Side effects that usually do not require medical attention (report to your doctor or health care professional if they continue or are bothersome): -change in sex drive or performance -changes in breast size in both males and females -changes in emotions or moods -headache -hot flashes -irritation at site where injected -loss of appetite -skin problems like acne, dry skin -vaginal dryness This list may not describe all possible side effects. Call your doctor for medical advice about side effects. You may report side effects to FDA at 1-800-FDA-1088. Where should I keep my medicine? This drug is given in a hospital or clinic and will not be stored at home. NOTE: This sheet is a summary. It may not cover all possible information. If you have questions about this medicine, talk to your doctor, pharmacist, or health care provider.  2015, Elsevier/Gold Standard. (2013-10-15 11:10:35)  

## 2016-08-09 NOTE — Addendum Note (Signed)
Addended by: Chauncey Cruel on: 08/09/2016 02:22 PM   Modules accepted: Orders

## 2016-08-09 NOTE — Progress Notes (Signed)
ID: [name]   DOB: Oct 31, 1975  MR#: [id number]  UDJ#:[id number]  PCP: Hoyt Koch, MD GYN: SU: Autumn Messing, MD [place]:  Gery Pray, MD OTHER MD:  Owens Loffler, MD   CHIEF COMPLAINT:  Right Breast Cancer  CURRENT THERAPY: anastrozole, goserelin  BREAST CANCER HISTORY: From the original intake note:  The patient developed pain in her right breast and brought it to her primary care physician's attention. Mammography at [facility] 03/10/2011 found the breasts to be heterogeneously dense. There was a mildly irregular mass posteriorly in the upper outer quadrant of the right breast. On ultrasound this was hypoechoic and measured approximately 2.4 cm, with irregular margins. The left breast was unremarkable.   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 Bilateral breast MRIs were obtained 03/18/2011, showing in the upper outer quadrant of the right breast an irregular mass measuring 5.5 cm. There was a suggestion of cortical thickening in the level I right axillary lymph node, which measured 1.2 cm. The patient was staged with chest CT scan and PET scan, which showed no distant disease. These studies did confirm the abnormality in the right breast as well as a hypermetabolic right axillary lymph node, making this a clinical T3 N1 or stage III invasive ductal carcinoma.   She completed neoadjuvant chemotherapy and proceeded to surgery and subsequent treatment as detailed below..  INTERVAL HISTORY: [name] returns today for follow up of her estrogen receptor positive breast cancer. She receives goserelin every 3 months. She tolerates that with no side effects that she is aware of and in particular currently she is having no problems with hot flashes. She is also on anastrozole daily. Again, she is not aware of any side  effects from that medication. She obtains it at a good price.  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 Currently she is not able to exercise regularly. Occasionally her ankles swell. She feels depressed but not anxious or forgetful. A detailed review of systems today was otherwise stable.  PAST MEDICAL HISTORY: Past Medical History:  Diagnosis Date  . Anemia   . Back pain    lumbar   . Breast cancer 2012   right  . Cough 09/07/2012  . Depression    no current med.  . Diabetes mellitus without complication   . Gastric ulcer   . GERD (gastroesophageal reflux disease)   . H/O scoliosis   . Headache(784.0)    hx. migraine in childhood  . Hypertension    under control with med., has been on med. x 5 yr.  . Peripheral neuropathy    s/p chemotherapy  . S/P radiation therapy 12/19/11 - 02/03/12   Right Chest Wall and Axilla  . Status post chemotherapy    finished 08/2011    PAST SURGICAL HISTORY: Past Surgical History:  Procedure Laterality Date  . ESOPHAGOGASTRODUODENOSCOPY (EGD) WITH PROPOFOL  09/06/2012   Procedure: ESOPHAGOGASTRODUODENOSCOPY (EGD) WITH PROPOFOL;  Surgeon: Milus Banister, MD;  Location: [facility];  Service: Endoscopy;  Laterality: N/A;  . HIP PINNING  age 45   bilateral  . MASTECTOMY W/ SENTINEL NODE BIOPSY  10/14/2011   Procedure: MASTECTOMY WITH SENTINEL LYMPH NODE BIOPSY;  Surgeon: Merrie Roof, MD;  Location: [place];  Service: General;  Laterality: Right;  . PORT-A-CATH REMOVAL  09/12/2012   Procedure: REMOVAL PORT-A-CATH;  Surgeon: Merrie Roof, MD;  Location: [place];  Service: General;  Laterality: Left;  . PORTACATH PLACEMENT  04/08/2011  . WOUND EXPLORATION  10/14/2011   right mastectomy wound    FAMILY HISTORY Family History  Problem Relation Age of Onset  . Heart disease  Father     heart attack  . Heart attack Father     died age 80  . Hypertension Father   . Hypertension Mother   . Anemia Mother   . Diabetes Mother   . Hypertension Brother   . Diabetes Brother   . Heart disease Maternal Grandmother     MI  . Hypertension Maternal Grandmother   . Diabetes Maternal Grandmother   . Hypertension Paternal Grandmother   . Stroke Mother   . Aneurysm Mother     brain  . Diabetes Paternal Grandmother   [name] died from a stroke at age 51. The patient's father died from a myocardial infarction at the age of 25. She has one sister and one brother. There is no history of breast or ovarian cancer in the family.   GYNECOLOGIC HISTORY: Menarche age 60. She was premenopausal at the time of diagnosis. She has never used birth control pills. She is GX P2. First pregnancy to term age 72. She had her last period at the time of her surgery, continues on goserelin every 3 months.  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 Her older daughter [name] currently is working 2 jobs. She hopes to become an Chief Financial Officer.. Her other daughter, [name], is currently 66 and will finish high school in 2018. She has not yet decided if she wants to go to college. The patient is separtated from her husband, Noam Franzen, who works in Architect, but as of December 2017 there still not divorced.Marland Kitchen     ADVANCED DIRECTIVES:at the 08/09/2016 visit the patient was given the appropriate documents to complete and notarize at her discretion. She does not want her husband to be her healthcare part of attorney but she intends to name her older daughter as healthcare part of attorney. She will get those documents motorized and bring [place] a copy   HEALTH MAINTENANCE: (Updated 06/25/2013) Social History  Substance Use Topics  . Smoking status: Former Research scientist (life sciences)  . Smokeless tobacco: Never Used     Comment: quit 1998 - states only  smoked for 1 month  . Alcohol use Yes     Comment: rarely     Colonoscopy: Never  PAP: Overdue   Bone density: 03/17/2014 t-score 0.0 (normal)  Lipid panel: Dr. Darron Doom    Allergies  Allergen Reactions  . Oxycodone Other (See Comments)    headache  . Tape Other (See Comments)    SKIN TEARS    Current Outpatient Prescriptions  Medication Sig Dispense Refill  . amLODipine (NORVASC) 10 MG tablet TAKE ONE TABLET BY MOUTH ONCE DAILY 30 tablet 11  . anastrozole (ARIMIDEX) 1 MG tablet TAKE ONE TABLET BY MOUTH ONCE DAILY 30 tablet 11  . B Complex-C (SUPER B COMPLEX PO) Take 1 tablet by mouth daily.    . benazepril-hydrochlorthiazide (LOTENSIN HCT) 20-12.5 MG tablet Take 1 tablet by mouth daily. 90 tablet 4  . budesonide (RHINOCORT AQUA) 32 MCG/ACT nasal spray  Place 2 sprays into both nostrils daily. (Patient not taking: Reported on 04/01/2015) 8.6 g 2  . goserelin (ZOLADEX) 10.8 MG injection Inject 10.8 mg into the skin every 3 (three) months.    . metFORMIN (GLUCOPHAGE) 1000 MG tablet Take 1 tablet (1,000 mg total) by mouth 2 (two) times daily with a meal. 60 tablet 11  . naproxen sodium (ANAPROX) 220 MG tablet Take 220 mg by mouth 2 (two) times daily with a meal.    . pantoprazole (PROTONIX) 40 MG tablet Take 1 tablet (40 mg total) by mouth daily. 90 tablet 4  . ranitidine (ZANTAC) 75 MG tablet Take 75 mg by mouth as needed. ONLY IF OUT OF PROTONIX     No current facility-administered medications for this visit.     OBJECTIVE: [name] Who Appears well Vitals:   08/09/16 1320  BP: 134/81  Pulse: 99  Resp: 18  Temp: 98.5 F (36.9 C)     Body mass index is 43.09 kg/m.    ECOG FS: 0 Filed Weights   08/09/16 1320  Weight: 275 lb 1.6 oz (124.8 kg)   Sclerae unicteric, pupils round and equal Oropharynx clear and moist-- no thrush or other lesions No cervical or supraclavicular adenopathy Lungs no rales or rhonchi Heart regular rate and rhythm Abd soft,  obese, nontender, positive bowel sounds MSK no focal spinal tenderness, no upper extremity lymphedema Neuro: nonfocal, well oriented, appropriate affect Breasts: She is status post right mastectomy and status post radiation to the right chest wall. There is no evidence of local recurrence. The right axilla is benign. The left breast is benign.  LAB RESULTS: Lab Results  Component Value Date   WBC 6.7 08/09/2016   NEUTROABS 4.1 08/09/2016   HGB 11.5 (L) 08/09/2016   HCT 37.0 08/09/2016   MCV 76.7 (L) 08/09/2016   PLT 358 08/09/2016      Chemistry      Component Value Date/Time   NA 141 02/16/2016 1425   K 3.6 02/16/2016 1425   CL 102 03/24/2015 1117   CL 101 12/21/2012 1019   CO2 26 02/16/2016 1425   BUN 9.2 02/16/2016 1425   CREATININE 0.8 02/16/2016 1425      Component Value Date/Time   CALCIUM 9.7 02/16/2016 1425   ALKPHOS 129 02/16/2016 1425   AST 23 02/16/2016 1425   ALT 20 02/16/2016 1425   BILITOT 0.62 02/16/2016 1425       STUDIES: Her last mammogram was September 2016. We have scheduled one for her at [facility] January 2018    ASSESSMENT: 40 y.o.  BRCA 1-2 negative [place] Yoder   (1) status post right breast upper outer quadrant biopsy July of 2012 for a clinical T3 N1 (stage III) invasive ductal carcinoma, grade 3, 99% estrogen and  100% progesterone receptor positive, HER-2 negative, with an MIB-1 of 98%.   (2) Status post 4 cycles of dose dense doxorubicin and cyclophosphamide, followed by weekly paclitaxel x 8, completed 08/25/2011  (3) s/p right mastectomy and sentinel lymph node sampling 10/14/2011 for a residual ypT2 ypN1(mic) invasive ductal carcinoma, grade 3, with ample margins  (4) status post radiation therapy under the care of Dr. Sondra Come,  completed in mid June.  (5)  Anti-estrogen therapy started 01/18/2012 with first Zoladex injection,  continued every 3 months. Anastrozole started in late June 2013, the goal being to continue for at least 5  years (June 2018).  (a0 Bone density at [facility] 05/19/2015 showed a T score  of -1.0 (normal is (.  (6) remote history of DVT  PLAN:  Ermalinda is  now nearly 5 years out from definitive surgery for her breast cancer with no evidence of disease recurrence. This is very favorable.  The plan is to continue goserelin and anastrozole and other 5 years.  She would benefit from a right prosthesis and new bras and I wrote her for that.  She is behind on her mammography and I have put her in for mammogram at [facility] in January.  She's also behind on her Pap smear. I made her aware of the fact that we do free Pap smear see her once or twice a year and she will try to find out the exact date so she can get that accomplished without cost to her.  Finally she was given a healthcare part of attorney form and she filled it out. She will take it to her local bank to get it notarize. She will bring [place] a copy. That will make sure that her husband, for who she has been separated more than a year, does not I as her healthcare part of attorney if something were to happen to her  She knows to call for any problems that may develop before her return visit here, which will be next September.  Chauncey Cruel, MD     08/09/2016

## 2016-08-10 LAB — FOLLICLE STIMULATING HORMONE: FSH: 4.8 m[IU]/mL

## 2016-08-10 LAB — ESTRADIOL

## 2016-08-18 LAB — HM DEXA SCAN: HM DEXA SCAN: -2.3

## 2016-08-25 LAB — HM DIABETES EYE EXAM

## 2016-09-02 ENCOUNTER — Encounter: Payer: Self-pay | Admitting: Internal Medicine

## 2016-09-02 NOTE — Progress Notes (Unsigned)
Results entered and sent to scan  

## 2016-09-15 ENCOUNTER — Other Ambulatory Visit: Payer: 59

## 2016-09-15 ENCOUNTER — Ambulatory Visit: Payer: 59

## 2016-09-29 ENCOUNTER — Encounter: Payer: Self-pay | Admitting: Internal Medicine

## 2016-09-29 ENCOUNTER — Ambulatory Visit (INDEPENDENT_AMBULATORY_CARE_PROVIDER_SITE_OTHER): Payer: 59 | Admitting: Internal Medicine

## 2016-09-29 DIAGNOSIS — R69 Illness, unspecified: Secondary | ICD-10-CM

## 2016-09-29 DIAGNOSIS — J111 Influenza due to unidentified influenza virus with other respiratory manifestations: Secondary | ICD-10-CM | POA: Insufficient documentation

## 2016-09-29 MED ORDER — HYDROCODONE-HOMATROPINE 5-1.5 MG/5ML PO SYRP: 5.0000 mL | ORAL_SOLUTION | Freq: Three times a day (TID) | ORAL | 0 refills | Status: DC | PRN

## 2016-09-29 NOTE — Progress Notes (Signed)
Pre visit review using our clinic review tool, if applicable. No additional management support is needed unless otherwise documented below in the visit note. 

## 2016-09-29 NOTE — Progress Notes (Signed)
   Subjective:    Patient ID: [name], female    DOB: 1975-09-06, 41 y.o.   MRN: [id number]  HPI The patient is a 41 YO female coming in for cough and congestion for about 3-4 days. She is having chills, body aches, headaches. She is also having some cough but no SOB. Overall symptoms are worsening. She is taking theraflu which is helping some during the day. No sinus pressure or ear pain. Sick contacts. Mild nausea but still eating and drinking well.   Review of Systems  Constitutional: Positive for activity change, appetite change, chills and fatigue. Negative for diaphoresis, fever and unexpected weight change.  HENT: Positive for congestion, rhinorrhea, sinus pain, sinus pressure and sore throat. Negative for ear discharge, ear pain, sneezing, tinnitus and voice change.   Eyes: Negative.   Respiratory: Positive for cough. Negative for chest tightness, shortness of breath, wheezing and stridor.   Cardiovascular: Negative.   Gastrointestinal: Negative.   Musculoskeletal: Positive for myalgias.  Neurological: Positive for headaches.      Objective:   Physical Exam  Constitutional: She is oriented to person, place, and time. She appears well-developed and well-nourished.  Overweight  HENT:  Head: Normocephalic and atraumatic.  Oropharynx with redness and clear drainage and nose without crusting.   Eyes: EOM are normal.  Neck: Normal range of motion. No JVD present.  Cardiovascular: Normal rate and regular rhythm.   Pulmonary/Chest: Effort normal and breath sounds normal. No respiratory distress. She has no wheezes. She has no rales.  Abdominal: Soft.  Lymphadenopathy:    She has no cervical adenopathy.  Neurological: She is alert and oriented to person, place, and time.  Skin: Skin is warm and dry.   Vitals:   09/29/16 1117  BP: 138/70  Pulse: 100  Temp: 98.5 F (36.9 C)  TempSrc: Oral  SpO2: 99%  Weight: 274 lb (124.3 kg)  Height: 5\' 7"  (1.702 m)      Assessment  & Plan:

## 2016-09-29 NOTE — Assessment & Plan Note (Signed)
Rx for hycodan cough syrup. Outside the window for tamiflu. Will start taking zyrtec. Reassured about course and symptom management.

## 2016-09-29 NOTE — Patient Instructions (Signed)
I would recommend to start taking zyrtec (cetirizine) to help with the drainage.   We have given you a cough syrup which you can use at night time to help you sleep better.    Influenza, Adult Influenza, more commonly known as "the flu," is a viral infection that primarily affects the respiratory tract. The respiratory tract includes organs that help you breathe, such as the lungs, nose, and throat. The flu causes many common cold symptoms, as well as a high fever and body aches. The flu spreads easily from person to person (is contagious). Getting a flu shot (influenza vaccination) every year is the best way to prevent influenza. What are the causes? Influenza is caused by a virus. You can catch the virus by:  Breathing in droplets from an infected person's cough or sneeze.  Touching something that was recently contaminated with the virus and then touching your mouth, nose, or eyes. What increases the risk? The following factors may make you more likely to get the flu:  Not cleaning your hands frequently with soap and water or alcohol-based hand sanitizer.  Having close contact with many people during cold and flu season.  Touching your mouth, eyes, or nose without washing or sanitizing your hands first.  Not drinking enough fluids or not eating a healthy diet.  Not getting enough sleep or exercise.  Being under a high amount of stress.  Not getting a yearly (annual) flu shot. You may be at a higher risk of complications from the flu, such as a severe lung infection (pneumonia), if you:  Are over the age of 79.  Are pregnant.  Have a weakened disease-fighting system (immune system). You may have a weakened immune system if you:  Have HIV or AIDS.  Are undergoing chemotherapy.  Aretaking medicines that reduce the activity of (suppress) the immune system.  Have a long-term (chronic) illness, such as heart disease, kidney disease, diabetes, or lung disease.  Have a liver  disorder.  Are obese.  Have anemia. What are the signs or symptoms? Symptoms of this condition typically last 4-10 days and may include:  Fever.  Chills.  Headache, body aches, or muscle aches.  Sore throat.  Cough.  Runny or congested nose.  Chest discomfort and cough.  Poor appetite.  Weakness or tiredness (fatigue).  Dizziness.  Nausea or vomiting. How is this diagnosed? This condition may be diagnosed based on your medical history and a physical exam. Your health care provider may do a nose or throat swab test to confirm the diagnosis. How is this treated? If influenza is detected early, you can be treated with antiviral medicine that can reduce the length of your illness and the severity of your symptoms. This medicine may be given by mouth (orally) or through an IV tube that is inserted in one of your veins. The goal of treatment is to relieve symptoms by taking care of yourself at home. This may include taking over-the-counter medicines, drinking plenty of fluids, and adding humidity to the air in your home. In some cases, influenza goes away on its own. Severe influenza or complications from influenza may be treated in a hospital. Follow these instructions at home:  Take over-the-counter and prescription medicines only as told by your health care provider.  Use a cool mist humidifier to add humidity to the air in your home. This can make breathing easier.  Rest as needed.  Drink enough fluid to keep your urine clear or pale yellow.  Cover your  mouth and nose when you cough or sneeze.  Wash your hands with soap and water often, especially after you cough or sneeze. If soap and water are not available, use hand sanitizer.  Stay home from work or school as told by your health care provider. Unless you are visiting your health care provider, try to avoid leaving home until your fever has been gone for 24 hours without the use of medicine.  Keep all follow-up  visits as told by your health care provider. This is important. How is this prevented?  Getting an annual flu shot is the best way to avoid getting the flu. You may get the flu shot in late summer, fall, or winter. Ask your health care provider when you should get your flu shot.  Wash your hands often or use hand sanitizer often.  Avoid contact with people who are sick during cold and flu season.  Eat a healthy diet, drink plenty of fluids, get enough sleep, and exercise regularly. Contact a health care provider if:  You develop new symptoms.  You have:  Chest pain.  Diarrhea.  A fever.  Your cough gets worse.  You produce more mucus.  You feel nauseous or you vomit. Get help right away if:  You develop shortness of breath or difficulty breathing.  Your skin or nails turn a bluish color.  You have severe pain or stiffness in your neck.  You develop a sudden headache or sudden pain in your face or ear.  You cannot stop vomiting. This information is not intended to replace advice given to you by your health care provider. Make sure you discuss any questions you have with your health care provider. Document Released: 08/05/2000 Document Revised: 01/14/2016 Document Reviewed: 06/02/2015 Elsevier Interactive Patient Education  2017 [employer].

## 2016-10-24 ENCOUNTER — Encounter: Payer: Self-pay | Admitting: Internal Medicine

## 2016-10-24 NOTE — Progress Notes (Unsigned)
Results entered and sent to scan  

## 2016-10-27 ENCOUNTER — Telehealth: Payer: Self-pay | Admitting: *Deleted

## 2016-10-27 NOTE — Telephone Encounter (Signed)
Patient called and moved her appts to later in the day on 3/13

## 2016-10-31 ENCOUNTER — Other Ambulatory Visit: Payer: Self-pay | Admitting: *Deleted

## 2016-10-31 DIAGNOSIS — Z170 Estrogen receptor positive status [ER+]: Principal | ICD-10-CM

## 2016-10-31 DIAGNOSIS — C50411 Malignant neoplasm of upper-outer quadrant of right female breast: Secondary | ICD-10-CM

## 2016-11-01 ENCOUNTER — Other Ambulatory Visit (HOSPITAL_BASED_OUTPATIENT_CLINIC_OR_DEPARTMENT_OTHER): Payer: Self-pay

## 2016-11-01 ENCOUNTER — Ambulatory Visit (HOSPITAL_BASED_OUTPATIENT_CLINIC_OR_DEPARTMENT_OTHER): Payer: Self-pay

## 2016-11-01 VITALS — BP 142/86 | HR 91 | Temp 98.7°F | Resp 18

## 2016-11-01 DIAGNOSIS — C50411 Malignant neoplasm of upper-outer quadrant of right female breast: Secondary | ICD-10-CM

## 2016-11-01 DIAGNOSIS — Z5111 Encounter for antineoplastic chemotherapy: Secondary | ICD-10-CM

## 2016-11-01 DIAGNOSIS — Z170 Estrogen receptor positive status [ER+]: Principal | ICD-10-CM

## 2016-11-01 DIAGNOSIS — Z86718 Personal history of other venous thrombosis and embolism: Secondary | ICD-10-CM

## 2016-11-01 LAB — COMPREHENSIVE METABOLIC PANEL
ALBUMIN: 3.6 g/dL (ref 3.5–5.0)
ALK PHOS: 148 U/L (ref 40–150)
ALT: 19 U/L (ref 0–55)
AST: 20 U/L (ref 5–34)
Anion Gap: 12 mEq/L — ABNORMAL HIGH (ref 3–11)
BILIRUBIN TOTAL: 0.29 mg/dL (ref 0.20–1.20)
BUN: 12.4 mg/dL (ref 7.0–26.0)
CO2: 27 meq/L (ref 22–29)
CREATININE: 0.9 mg/dL (ref 0.6–1.1)
Calcium: 9.4 mg/dL (ref 8.4–10.4)
Chloride: 104 mEq/L (ref 98–109)
EGFR: 88 mL/min/{1.73_m2} — ABNORMAL LOW (ref >90)
GLUCOSE: 243 mg/dL — AB (ref 70–140)
Potassium: 3.2 mEq/L — ABNORMAL LOW (ref 3.5–5.1)
SODIUM: 143 meq/L (ref 136–145)
TOTAL PROTEIN: 7.6 g/dL (ref 6.4–8.3)

## 2016-11-01 LAB — CBC WITH DIFFERENTIAL/PLATELET
BASO%: 1.3 % (ref 0.0–2.0)
Basophils Absolute: 0.1 10*3/uL (ref 0.0–0.1)
EOS ABS: 0.3 10*3/uL (ref 0.0–0.5)
EOS%: 3.5 % (ref 0.0–7.0)
HCT: 33.8 % — ABNORMAL LOW (ref 34.8–46.6)
HEMOGLOBIN: 10.8 g/dL — AB (ref 11.6–15.9)
LYMPH%: 27.6 % (ref 14.0–49.7)
MCH: 24.3 pg — ABNORMAL LOW (ref 25.1–34.0)
MCHC: 32 g/dL (ref 31.5–36.0)
MCV: 75.9 fL — ABNORMAL LOW (ref 79.5–101.0)
MONO#: 0.6 10*3/uL (ref 0.1–0.9)
MONO%: 8.6 % (ref 0.0–14.0)
NEUT%: 59 % (ref 38.4–76.8)
NEUTROS ABS: 4.4 10*3/uL (ref 1.5–6.5)
PLATELETS: 375 10*3/uL (ref 145–400)
RBC: 4.45 10*6/uL (ref 3.70–5.45)
RDW: 13.9 % (ref 11.2–14.5)
WBC: 7.5 10*3/uL (ref 3.9–10.3)
lymph#: 2.1 10*3/uL (ref 0.9–3.3)

## 2016-11-01 MED ORDER — GOSERELIN ACETATE 10.8 MG ~~LOC~~ IMPL
10.8000 mg | DRUG_IMPLANT | Freq: Once | SUBCUTANEOUS | Status: AC
Start: 2016-11-01 — End: 2016-11-01
  Administered 2016-11-01: 10.8 mg via SUBCUTANEOUS
  Filled 2016-11-01: qty 10.8

## 2016-11-10 ENCOUNTER — Other Ambulatory Visit: Payer: Self-pay | Admitting: Oncology

## 2016-11-10 DIAGNOSIS — C50919 Malignant neoplasm of unspecified site of unspecified female breast: Secondary | ICD-10-CM

## 2016-11-17 ENCOUNTER — Other Ambulatory Visit: Payer: Self-pay | Admitting: *Deleted

## 2016-11-17 DIAGNOSIS — C50919 Malignant neoplasm of unspecified site of unspecified female breast: Secondary | ICD-10-CM

## 2016-11-17 MED ORDER — ANASTROZOLE 1 MG PO TABS: 1.0000 mg | ORAL_TABLET | Freq: Every day | ORAL | 11 refills | Status: DC

## 2016-11-21 MED FILL — ANASTROZOLE 1 MG TABLET: 1 | 90 days supply | Qty: 90 | Fill #0

## 2016-12-08 ENCOUNTER — Ambulatory Visit: Payer: 59

## 2016-12-08 ENCOUNTER — Other Ambulatory Visit: Payer: 59

## 2017-01-23 ENCOUNTER — Other Ambulatory Visit: Payer: Self-pay | Admitting: Adult Health

## 2017-01-23 DIAGNOSIS — Z170 Estrogen receptor positive status [ER+]: Principal | ICD-10-CM

## 2017-01-23 DIAGNOSIS — C50411 Malignant neoplasm of upper-outer quadrant of right female breast: Secondary | ICD-10-CM

## 2017-01-24 ENCOUNTER — Other Ambulatory Visit: Payer: 59

## 2017-01-24 ENCOUNTER — Ambulatory Visit: Payer: 59

## 2017-01-27 ENCOUNTER — Ambulatory Visit (HOSPITAL_BASED_OUTPATIENT_CLINIC_OR_DEPARTMENT_OTHER): Payer: Managed Care, Other (non HMO)

## 2017-01-27 ENCOUNTER — Other Ambulatory Visit (HOSPITAL_BASED_OUTPATIENT_CLINIC_OR_DEPARTMENT_OTHER): Payer: Managed Care, Other (non HMO)

## 2017-01-27 VITALS — BP 152/90 | HR 82 | Temp 98.0°F | Resp 18

## 2017-01-27 DIAGNOSIS — Z86718 Personal history of other venous thrombosis and embolism: Secondary | ICD-10-CM

## 2017-01-27 DIAGNOSIS — C50411 Malignant neoplasm of upper-outer quadrant of right female breast: Secondary | ICD-10-CM

## 2017-01-27 DIAGNOSIS — Z170 Estrogen receptor positive status [ER+]: Principal | ICD-10-CM

## 2017-01-27 DIAGNOSIS — Z5111 Encounter for antineoplastic chemotherapy: Secondary | ICD-10-CM

## 2017-01-27 LAB — CBC WITH DIFFERENTIAL/PLATELET
BASO%: 0.9 % (ref 0.0–2.0)
Basophils Absolute: 0.1 10*3/uL (ref 0.0–0.1)
EOS%: 3.4 % (ref 0.0–7.0)
Eosinophils Absolute: 0.2 10*3/uL (ref 0.0–0.5)
HEMATOCRIT: 35.9 % (ref 34.8–46.6)
HEMOGLOBIN: 11.5 g/dL — AB (ref 11.6–15.9)
LYMPH%: 24.8 % (ref 14.0–49.7)
MCH: 24.3 pg — ABNORMAL LOW (ref 25.1–34.0)
MCHC: 31.9 g/dL (ref 31.5–36.0)
MCV: 76 fL — ABNORMAL LOW (ref 79.5–101.0)
MONO#: 0.6 10*3/uL (ref 0.1–0.9)
MONO%: 10.3 % (ref 0.0–14.0)
NEUT%: 60.6 % (ref 38.4–76.8)
NEUTROS ABS: 3.5 10*3/uL (ref 1.5–6.5)
Platelets: 361 10*3/uL (ref 145–400)
RBC: 4.73 10*6/uL (ref 3.70–5.45)
RDW: 14.1 % (ref 11.2–14.5)
WBC: 5.8 10*3/uL (ref 3.9–10.3)
lymph#: 1.4 10*3/uL (ref 0.9–3.3)

## 2017-01-27 LAB — COMPREHENSIVE METABOLIC PANEL
ALBUMIN: 3.5 g/dL (ref 3.5–5.0)
ALK PHOS: 125 U/L (ref 40–150)
ALT: 21 U/L (ref 0–55)
AST: 23 U/L (ref 5–34)
Anion Gap: 10 mEq/L (ref 3–11)
BUN: 8.9 mg/dL (ref 7.0–26.0)
CALCIUM: 9.8 mg/dL (ref 8.4–10.4)
CO2: 30 mEq/L — ABNORMAL HIGH (ref 22–29)
CREATININE: 0.8 mg/dL (ref 0.6–1.1)
Chloride: 102 mEq/L (ref 98–109)
EGFR: >90 mL/min/{1.73_m2} (ref >90)
Glucose: 193 mg/dl — ABNORMAL HIGH (ref 70–140)
Potassium: 3.8 mEq/L (ref 3.5–5.1)
Sodium: 143 mEq/L (ref 136–145)
TOTAL PROTEIN: 7.5 g/dL (ref 6.4–8.3)
Total Bilirubin: 0.57 mg/dL (ref 0.20–1.20)

## 2017-01-27 MED ORDER — GOSERELIN ACETATE 10.8 MG ~~LOC~~ IMPL
10.8000 mg | DRUG_IMPLANT | Freq: Once | SUBCUTANEOUS | Status: AC
  Administered 2017-01-27: 10.8 mg via SUBCUTANEOUS
  Filled 2017-01-27: qty 10.8

## 2017-01-27 NOTE — Patient Instructions (Signed)
Goserelin injection What is this medicine? GOSERELIN (GOE se rel in) is similar to a hormone found in the body. It lowers the amount of sex hormones that the body makes. Men will have lower testosterone levels and women will have lower estrogen levels while taking this medicine. In men, this medicine is used to treat prostate cancer; the injection is either given once per month or once every 12 weeks. A once per month injection (only) is used to treat women with endometriosis, dysfunctional uterine bleeding, or advanced breast cancer. This medicine may be used for other purposes; ask your health care provider or pharmacist if you have questions. COMMON BRAND NAME(S): Zoladex What should I tell my health care provider before I take this medicine? They need to know if you have any of these conditions (some only apply to women): -diabetes -heart disease or previous heart attack -high blood pressure -high cholesterol -kidney disease -osteoporosis or low bone density -problems passing urine -spinal cord injury -stroke -tobacco smoker -an unusual or allergic reaction to goserelin, hormone therapy, other medicines, foods, dyes, or preservatives -pregnant or trying to get pregnant -breast-feeding How should I use this medicine? This medicine is for injection under the skin. It is given by a health care professional in a hospital or clinic setting. Men receive this injection once every 4 weeks or once every 12 weeks. Women will only receive the once every 4 weeks injection. Talk to your pediatrician regarding the use of this medicine in children. Special care may be needed. Overdosage: If you think you have taken too much of this medicine contact a poison control center or emergency room at once. NOTE: This medicine is only for you. Do not share this medicine with others. What if I miss a dose? It is important not to miss your dose. Call your doctor or health care professional if you are unable to  keep an appointment. What may interact with this medicine? -female hormones like estrogen -herbal or dietary supplements like black cohosh, chasteberry, or DHEA -female hormones like testosterone -prasterone This list may not describe all possible interactions. Give your health care provider a list of all the medicines, herbs, non-prescription drugs, or dietary supplements you use. Also tell them if you smoke, drink alcohol, or use illegal drugs. Some items may interact with your medicine. What should I watch for while using this medicine? Visit your doctor or health care professional for regular checks on your progress. Your symptoms may appear to get worse during the first weeks of this therapy. Tell your doctor or healthcare professional if your symptoms do not start to get better or if they get worse after this time. Your bones may get weaker if you take this medicine for a long time. If you smoke or frequently drink alcohol you may increase your risk of bone loss. A family history of osteoporosis, chronic use of drugs for seizures (convulsions), or corticosteroids can also increase your risk of bone loss. Talk to your doctor about how to keep your bones strong. This medicine should stop regular monthly menstration in women. Tell your doctor if you continue to menstrate. Women should not become pregnant while taking this medicine or for 12 weeks after stopping this medicine. Women should inform their doctor if they wish to become pregnant or think they might be pregnant. There is a potential for serious side effects to an unborn child. Talk to your health care professional or pharmacist for more information. Do not breast-feed an infant while taking   this medicine. Men should inform their doctors if they wish to father a child. This medicine may lower sperm counts. Talk to your health care professional or pharmacist for more information. What side effects may I notice from receiving this  medicine? Side effects that you should report to your doctor or health care professional as soon as possible: -allergic reactions like skin rash, itching or hives, swelling of the face, lips, or tongue -bone pain -breathing problems -changes in vision -chest pain -feeling faint or lightheaded, falls -fever, chills -pain, swelling, warmth in the leg -pain, tingling, numbness in the hands or feet -signs and symptoms of low blood pressure like dizziness; feeling faint or lightheaded, falls; unusually weak or tired -stomach pain -swelling of the ankles, feet, hands -trouble passing urine or change in the amount of urine -unusually high or low blood pressure -unusually weak or tired Side effects that usually do not require medical attention (report to your doctor or health care professional if they continue or are bothersome): -change in sex drive or performance -changes in breast size in both males and females -changes in emotions or moods -headache -hot flashes -irritation at site where injected -loss of appetite -skin problems like acne, dry skin -vaginal dryness This list may not describe all possible side effects. Call your doctor for medical advice about side effects. You may report side effects to FDA at 1-800-FDA-1088. Where should I keep my medicine? This drug is given in a hospital or clinic and will not be stored at home. NOTE: This sheet is a summary. It may not cover all possible information. If you have questions about this medicine, talk to your doctor, pharmacist, or health care provider.  2015, Elsevier/Gold Standard. (2013-10-15 11:10:35)  

## 2017-02-08 ENCOUNTER — Other Ambulatory Visit: Payer: Self-pay | Admitting: Nurse Practitioner

## 2017-04-17 ENCOUNTER — Other Ambulatory Visit: Payer: Self-pay | Admitting: *Deleted

## 2017-04-17 DIAGNOSIS — Z170 Estrogen receptor positive status [ER+]: Principal | ICD-10-CM

## 2017-04-17 DIAGNOSIS — C50411 Malignant neoplasm of upper-outer quadrant of right female breast: Secondary | ICD-10-CM

## 2017-04-18 ENCOUNTER — Ambulatory Visit (HOSPITAL_BASED_OUTPATIENT_CLINIC_OR_DEPARTMENT_OTHER): Payer: Managed Care, Other (non HMO) | Admitting: Oncology

## 2017-04-18 ENCOUNTER — Ambulatory Visit (HOSPITAL_BASED_OUTPATIENT_CLINIC_OR_DEPARTMENT_OTHER): Payer: Managed Care, Other (non HMO)

## 2017-04-18 ENCOUNTER — Other Ambulatory Visit (HOSPITAL_BASED_OUTPATIENT_CLINIC_OR_DEPARTMENT_OTHER): Payer: Managed Care, Other (non HMO)

## 2017-04-18 VITALS — BP 113/62 | HR 98 | Temp 98.2°F | Resp 20 | Ht 67.0 in | Wt 275.5 lb

## 2017-04-18 DIAGNOSIS — Z5111 Encounter for antineoplastic chemotherapy: Secondary | ICD-10-CM | POA: Diagnosis not present

## 2017-04-18 DIAGNOSIS — Z170 Estrogen receptor positive status [ER+]: Secondary | ICD-10-CM | POA: Diagnosis not present

## 2017-04-18 DIAGNOSIS — C50411 Malignant neoplasm of upper-outer quadrant of right female breast: Secondary | ICD-10-CM

## 2017-04-18 DIAGNOSIS — Z86718 Personal history of other venous thrombosis and embolism: Secondary | ICD-10-CM

## 2017-04-18 DIAGNOSIS — Z79811 Long term (current) use of aromatase inhibitors: Secondary | ICD-10-CM

## 2017-04-18 LAB — COMPREHENSIVE METABOLIC PANEL
ALK PHOS: 120 U/L (ref 40–150)
ALT: 18 U/L (ref 0–55)
ANION GAP: 9 meq/L (ref 3–11)
AST: 19 U/L (ref 5–34)
Albumin: 3.7 g/dL (ref 3.5–5.0)
BILIRUBIN TOTAL: 0.59 mg/dL (ref 0.20–1.20)
BUN: 17.6 mg/dL (ref 7.0–26.0)
CALCIUM: 10.2 mg/dL (ref 8.4–10.4)
CO2: 29 meq/L (ref 22–29)
CREATININE: 0.9 mg/dL (ref 0.6–1.1)
Chloride: 102 mEq/L (ref 98–109)
EGFR: 87 mL/min/{1.73_m2} — AB (ref >90)
Glucose: 228 mg/dl — ABNORMAL HIGH (ref 70–140)
Potassium: 3.9 mEq/L (ref 3.5–5.1)
Sodium: 140 mEq/L (ref 136–145)
TOTAL PROTEIN: 7.9 g/dL (ref 6.4–8.3)

## 2017-04-18 LAB — CBC WITH DIFFERENTIAL/PLATELET
BASO%: 0.3 % (ref 0.0–2.0)
BASOS ABS: 0 10*3/uL (ref 0.0–0.1)
EOS ABS: 0.3 10*3/uL (ref 0.0–0.5)
EOS%: 4.3 % (ref 0.0–7.0)
HCT: 35.2 % (ref 34.8–46.6)
HEMOGLOBIN: 11.1 g/dL — AB (ref 11.6–15.9)
LYMPH%: 28.3 % (ref 14.0–49.7)
MCH: 24.8 pg — AB (ref 25.1–34.0)
MCHC: 31.5 g/dL (ref 31.5–36.0)
MCV: 78.7 fL — AB (ref 79.5–101.0)
MONO#: 0.7 10*3/uL (ref 0.1–0.9)
MONO%: 9.1 % (ref 0.0–14.0)
NEUT#: 4.2 10*3/uL (ref 1.5–6.5)
NEUT%: 58 % (ref 38.4–76.8)
PLATELETS: 361 10*3/uL (ref 145–400)
RBC: 4.47 10*6/uL (ref 3.70–5.45)
RDW: 14.1 % (ref 11.2–14.5)
WBC: 7.3 10*3/uL (ref 3.9–10.3)
lymph#: 2.1 10*3/uL (ref 0.9–3.3)

## 2017-04-18 MED ORDER — GOSERELIN ACETATE 10.8 MG ~~LOC~~ IMPL
10.8000 mg | DRUG_IMPLANT | Freq: Once | SUBCUTANEOUS | Status: AC
  Administered 2017-04-18: 10.8 mg via SUBCUTANEOUS
  Filled 2017-04-18: qty 10.8

## 2017-04-18 NOTE — Progress Notes (Signed)
ID: [name]   DOB: December 16, 1975  MR#: [id number]  WKM#:[id number]  PCP: Annette Koch, MD GYN: SU: Annette Messing, MD [place]:  Annette Pray, MD OTHER MD:  Annette Loffler, MD   CHIEF COMPLAINT:  Right Breast Cancer  CURRENT THERAPY: anastrozole, goserelin  BREAST CANCER HISTORY: From the original intake note:  The patient developed pain in her right breast and brought it to her primary care physician's attention. Mammography at [facility] 03/10/2011 found the breasts to be heterogeneously dense. There was a mildly irregular mass posteriorly in the upper outer quadrant of the right breast. On ultrasound this was hypoechoic and measured approximately 2.4 cm, with irregular margins. The left breast was unremarkable.   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 Bilateral breast MRIs were obtained 03/18/2011, showing in the upper outer quadrant of the right breast an irregular mass measuring 5.5 cm. There was a suggestion of cortical thickening in the level I right axillary lymph node, which measured 1.2 cm. The patient was staged with chest CT scan and PET scan, which showed no distant disease. These studies did confirm the abnormality in the right breast as well as a hypermetabolic right axillary lymph node, making this a clinical T3 N1 or stage III invasive ductal carcinoma.   She completed neoadjuvant chemotherapy and proceeded to surgery and subsequent treatment as detailed below..  INTERVAL HISTORY: [name] returns today for follow-up and treatment of her estrogen receptor positive breast cancer. She continues on anastrozole, with good tolerance.Hot flashes and vaginal dryness are not a major issue. She never developed the arthralgias or myalgias that many patients can experience on this medication. She obtains it at a  good price.  She is also on goserelin every 28 days. She does have some hot flashes related to that. Otherwise she tolerated with no side effects that she is aware of.  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 She is on her feet a lot and she tells me she clocks between 12 and 15,000 steps a day. She otherwise does not exercise. Sometimes she feels a little lightheaded at work. She sits down and then goes on. Otherwise everything is going "pretty good". A detailed review of systems was noncontributory except as noted  PAST MEDICAL HISTORY: Past Medical History:  Diagnosis Date  . Anemia   . Back pain    lumbar   . Breast cancer ([name]) 2012   right  . Cough 09/07/2012  . Depression    no current med.  . Diabetes mellitus without complication ([place])   . Gastric ulcer   . GERD (gastroesophageal reflux disease)   . H/O scoliosis   . Headache(784.0)    hx. migraine in childhood  . Hypertension    under control with med., has been on med. x 5 yr.  . Peripheral neuropathy (HCC)    s/p chemotherapy  . S/P radiation therapy 12/19/11 - 02/03/12   Right Chest Wall and Axilla  . Status post chemotherapy    finished 08/2011    PAST SURGICAL HISTORY: Past Surgical History:  Procedure Laterality Date  . ESOPHAGOGASTRODUODENOSCOPY (EGD) WITH PROPOFOL  09/06/2012   Procedure: ESOPHAGOGASTRODUODENOSCOPY (EGD) WITH PROPOFOL;  Surgeon: Annette Banister, MD;  Location[facility];  Service: Endoscopy;  Laterality: N/A;  . HIP PINNING  age 27   bilateral  . MASTECTOMY W/ SENTINEL NODE BIOPSY  10/14/2011   Procedure: MASTECTOMY WITH SENTINEL LYMPH NODE BIOPSY;  Surgeon: Annette Roof, MD;  Location: [place];  Service: General;  Laterality: Right;  . PORT-A-CATH REMOVAL  09/12/2012   Procedure: REMOVAL PORT-A-CATH;  Surgeon: Annette Roof, MD;  Location: [place];  Service: General;  Laterality: Left;  . PORTACATH  PLACEMENT  04/08/2011  . WOUND EXPLORATION  10/14/2011   right mastectomy wound    FAMILY HISTORY Family History  Problem Relation Age of Onset  . Heart disease Father        heart attack  . Heart attack Father        died age 71  . Hypertension Father   . Hypertension Mother   . Anemia Mother   . Diabetes Mother   . Hypertension Brother   . Diabetes Brother   . Heart disease Maternal Grandmother        MI  . Hypertension Maternal Grandmother   . Diabetes Maternal Grandmother   . Hypertension Paternal Grandmother   . Stroke Mother   . Aneurysm Mother        brain  . Diabetes Paternal Grandmother   [name] died from a stroke at age 22. The patient's father died from a myocardial infarction at the age of 70. She has one sister and one brother. There is no history of breast or ovarian cancer in the family.   GYNECOLOGIC HISTORY: Menarche age 43. She was premenopausal at the time of diagnosis. She has never used birth control pills. She is GX P2. First pregnancy to term age 74. She had her last period at the time of her surgery, continues on goserelin every 3 months.  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 Her older daughter [name] currently is working 2 jobs. She hopes to become an Chief Financial Officer.. Her other daughter, [name], is currently 54 and will finish high school in 2018. She has not yet decided if she wants to go to college. The patient is separtated from her husband, [name], who works in Architect, but as of December 2017 there still not divorced.Marland Kitchen     ADVANCED DIRECTIVES:at the 08/09/2016 visit the patient was given the appropriate documents to complete and notarize at her discretion. She does not want her husband to be her healthcare part of attorney but she intends to name her older daughter as healthcare part of attorney. She will get those documents motorized and bring [place] a copy   HEALTH  MAINTENANCE: (Updated 06/25/2013) Social History  Substance Use Topics  . Smoking status: Former Research scientist (life sciences)  . Smokeless tobacco: Never Used     Comment: quit 1998 - states only smoked for 1 month  . Alcohol use Yes     Comment: rarely     Colonoscopy: Never  PAP: Overdue   Bone density: 03/17/2014 t-score 0.0 (normal)  Lipid panel: Dr. Darron Doom    Allergies  Allergen Reactions  . Oxycodone Other (See Comments)    headache  . Tape Other (See Comments)    SKIN TEARS    Current Outpatient Prescriptions  Medication Sig Dispense Refill  . amLODipine (NORVASC) 10 MG tablet TAKE ONE TABLET BY MOUTH ONCE DAILY 30 tablet 11  . anastrozole (ARIMIDEX) 1 MG tablet TAKE ONE TABLET BY MOUTH ONCE DAILY 30 tablet 11  . anastrozole (ARIMIDEX) 1 MG  tablet Take 1 tablet (1 mg total) by mouth daily. 30 tablet 11  . B Complex-C (SUPER B COMPLEX PO) Take 1 tablet by mouth daily.    . benazepril-hydrochlorthiazide (LOTENSIN HCT) 20-12.5 MG tablet Take 1 tablet by mouth daily. 90 tablet 4  . goserelin (ZOLADEX) 10.8 MG injection Inject 10.8 mg into the skin every 3 (three) months.    Marland Kitchen HYDROcodone-homatropine (HYCODAN) 5-1.5 MG/5ML syrup Take 5 mLs by mouth every 8 (eight) hours as needed for cough. 120 mL 0  . metFORMIN (GLUCOPHAGE) 1000 MG tablet Take 1 tablet (1,000 mg total) by mouth 2 (two) times daily with a meal. 60 tablet 11  . naproxen sodium (ANAPROX) 220 MG tablet Take 220 mg by mouth 2 (two) times daily with a meal.    . pantoprazole (PROTONIX) 40 MG tablet Take 1 tablet (40 mg total) by mouth daily. 90 tablet 4  . ranitidine (ZANTAC) 75 MG tablet Take 75 mg by mouth as needed. ONLY IF OUT OF PROTONIX     No current facility-administered medications for this visit.     OBJECTIVE: Young African American woman In no acute distress  Vitals:   04/18/17 1416  BP: 113/62  Pulse: 98  Resp: 20  Temp: 98.2 F (36.8 C)  SpO2: 100%     Body mass index is 43.15 kg/m.    ECOG FS: 0 Filed  Weights   04/18/17 1416  Weight: 275 lb 8 oz (125 kg)   Sclerae unicteric, EOMs intact Oropharynx clear and moist No cervical or supraclavicular adenopathy Lungs no rales or rhonchi Heart regular rate and rhythm Abd soft, nontender, positive bowel sounds MSK no focal spinal tenderness, no upper extremity lymphedema Neuro: nonfocal, well oriented, appropriate affect Breasts: The right breast is status post mastectomy and radiation. There is no evidence of local recurrence. The left breast is benign. Both axillae are benign.  LAB RESULTS: Lab Results  Component Value Date   WBC 7.3 04/18/2017   NEUTROABS 4.2 04/18/2017   HGB 11.1 (L) 04/18/2017   HCT 35.2 04/18/2017   MCV 78.7 (L) 04/18/2017   PLT 361 04/18/2017      Chemistry      Component Value Date/Time   NA 143 01/27/2017 0911   K 3.8 01/27/2017 0911   CL 102 03/24/2015 1117   CL 101 12/21/2012 1019   CO2 30 (H) 01/27/2017 0911   BUN 8.9 01/27/2017 0911   CREATININE 0.8 01/27/2017 0911      Component Value Date/Time   CALCIUM 9.8 01/27/2017 0911   ALKPHOS 125 01/27/2017 0911   AST 23 01/27/2017 0911   ALT 21 01/27/2017 0911   BILITOT 0.57 01/27/2017 0911       STUDIES: Mammography at [facility] January 2018 was unremarkable.   ASSESSMENT: 41 y.o.  BRCA 1-2 negative [place] woman   (1) status post right breast upper outer quadrant biopsy July of 2012 for a clinical T3 N1 (stage III) invasive ductal carcinoma, grade 3, 99% estrogen and  100% progesterone receptor positive, HER-2 negative, with an MIB-1 of 98%.   (2) Status post 4 cycles of dose dense doxorubicin and cyclophosphamide, followed by weekly paclitaxel x 8, completed 08/25/2011  (3) s/p right mastectomy and sentinel lymph node sampling 10/14/2011 for a residual ypT2 ypN1(mic) invasive ductal carcinoma, grade 3, with ample margins  (4) status post radiation therapy under the care of Dr. Sondra Come,  completed in mid June.  (5)  Anti-estrogen therapy  started 01/18/2012 with first Zoladex injection,  continued every 3 months. Anastrozole started in late June 2013, the goal being to continue for at least 5 years (June 2018).  (a0 Bone density at [facility] 05/19/2015 showed a T score of -1.0 (normal is (.  (6) remote history of DVT  PLAN:  Tayte is  now nearly 5 years out from definitive surgery for her breast cancer with no evidence of disease recurrence. This is very favorable.  The plan is to continue goserelin and anastrozole and other 5 years.  She would benefit from a right prosthesis and new bras and I wrote her for that.  She is behind on her mammography and I have put her in for mammogram at [facility] in January.  She's also behind on her Pap smear. I made her aware of the fact that we do free Pap smear see her once or twice a year and she will try to find out the exact date so she can get that accomplished without cost to her.  Finally she was given a healthcare part of attorney form and she filled it out. She will take it to her local bank to get it notarize. She will bring [place] a copy. That will make sure that her husband, for who she has been separated more than a year, does not I as her healthcare part of attorney if something were to happen to her  She knows to call for any problems that may develop before her return visit here, which will be next September.  Chauncey Cruel, MD     04/18/2017

## 2017-04-22 ENCOUNTER — Other Ambulatory Visit: Payer: Self-pay | Admitting: Nurse Practitioner

## 2017-04-25 ENCOUNTER — Other Ambulatory Visit: Payer: Self-pay | Admitting: Oncology

## 2017-05-16 ENCOUNTER — Emergency Department (HOSPITAL_COMMUNITY)
Admission: EM | Admit: 2017-05-16 | Discharge: 2017-05-16 | Disposition: A | Discharge status: Home / Self Care | Payer: Managed Care, Other (non HMO) | Attending: Emergency Medicine | Admitting: Emergency Medicine

## 2017-05-16 ENCOUNTER — Encounter (HOSPITAL_COMMUNITY): Payer: Self-pay | Admitting: Emergency Medicine

## 2017-05-16 DIAGNOSIS — J069 Acute upper respiratory infection, unspecified: Secondary | ICD-10-CM | POA: Diagnosis not present

## 2017-05-16 DIAGNOSIS — Z87891 Personal history of nicotine dependence: Secondary | ICD-10-CM | POA: Insufficient documentation

## 2017-05-16 DIAGNOSIS — Z7984 Long term (current) use of oral hypoglycemic drugs: Secondary | ICD-10-CM | POA: Insufficient documentation

## 2017-05-16 DIAGNOSIS — E119 Type 2 diabetes mellitus without complications: Secondary | ICD-10-CM | POA: Insufficient documentation

## 2017-05-16 DIAGNOSIS — M546 Pain in thoracic spine: Secondary | ICD-10-CM | POA: Insufficient documentation

## 2017-05-16 DIAGNOSIS — I10 Essential (primary) hypertension: Secondary | ICD-10-CM | POA: Insufficient documentation

## 2017-05-16 DIAGNOSIS — Z79899 Other long term (current) drug therapy: Secondary | ICD-10-CM | POA: Diagnosis not present

## 2017-05-16 DIAGNOSIS — R05 Cough: Secondary | ICD-10-CM

## 2017-05-16 DIAGNOSIS — M549 Dorsalgia, unspecified: Secondary | ICD-10-CM | POA: Diagnosis present

## 2017-05-16 DIAGNOSIS — R059 Cough, unspecified: Secondary | ICD-10-CM

## 2017-05-16 MED ORDER — CYCLOBENZAPRINE HCL 10 MG PO TABS: 10.0000 mg | ORAL_TABLET | Freq: Two times a day (BID) | ORAL | 0 refills | Status: DC | PRN

## 2017-05-16 NOTE — ED Notes (Signed)
See edp assessment 

## 2017-05-16 NOTE — Discharge Instructions (Signed)
Rest and drink plenty of fluids Take Tylenol/Ibuprofen for pain Take muscle relaxer at bedtime to help you sleep. This medicine makes you drowsy so do not take before driving or work Use a heating pad for sore muscles - use for 20 minutes several times a day Return for worsening symptoms

## 2017-05-16 NOTE — ED Provider Notes (Signed)
[place] DEPT Provider Note   CSN: [id number] Arrival date & time: 05/16/17  1613     History   Chief Complaint Chief Complaint  Patient presents with  . Back Pain    also cough, weakness left leg    HPI [name] is a 41 y.o. female who presents with back pain. PMH significant for hx of R breast cancer, back pain, type 2 DM, obesity. She states that she started to have a productive cough and sore throat starting last night. She reports a sick contact with her daughter who has similar symptoms. She started to have spasming over the right side of her chest to her right back. This has improved some but she started to have left thigh and knee pain and weakness today as well. She also has lightheadedness and decreased PO intake. She denies fever, chills, headache, neck pain, chest pain, SOB, vomiting, dysuria. She has taken Theraflu which has helped.    HPI  Past Medical History:  Diagnosis Date  . Anemia   . Back pain    lumbar   . Breast cancer ([place]) 2012   right  . Cough 09/07/2012  . Depression    no current med.  . Diabetes mellitus without complication ([place])   . Gastric ulcer   . GERD (gastroesophageal reflux disease)   . H/O scoliosis   . Headache(784.0)    hx. migraine in childhood  . Hypertension    under control with med., has been on med. x 5 yr.  . Peripheral neuropathy    s/p chemotherapy  . S/P radiation therapy 12/19/11 - 02/03/12   Right Chest Wall and Axilla  . Status post chemotherapy    finished 08/2011    Patient Active Problem List   Diagnosis Date Noted  . Influenza-like illness 09/29/2016  . Diabetes mellitus type II, non insulin dependent ([place]) 10/05/2015  . Obesity, morbid, BMI 40.0-49.9 ([place]) 10/05/2015  . Obesity 05/23/2014  . Diabetes type 2, uncontrolled ([place]) 05/23/2014  . Malignant neoplasm of upper-outer quadrant of right breast in female, estrogen receptor positive ([place]) 09/09/2013  . History of DVT (deep vein thrombosis)  01/09/2012  . Peripheral neuropathy   . Iron deficiency anemia 06/29/2011  . Hypertension 02/28/2011    Past Surgical History:  Procedure Laterality Date  . ESOPHAGOGASTRODUODENOSCOPY (EGD) WITH PROPOFOL  09/06/2012   Procedure: ESOPHAGOGASTRODUODENOSCOPY (EGD) WITH PROPOFOL;  Surgeon: Milus Banister, MD;  [facility];  Service: Endoscopy;  Laterality: N/A;  . HIP PINNING  age 11   bilateral  . MASTECTOMY W/ SENTINEL NODE BIOPSY  10/14/2011   Procedure: MASTECTOMY WITH SENTINEL LYMPH NODE BIOPSY;  Surgeon: Merrie Roof, MD;  Location: [place];  Service: General;  Laterality: Right;  . PORT-A-CATH REMOVAL  09/12/2012   Procedure: REMOVAL PORT-A-CATH;  Surgeon: Merrie Roof, MD;  Location: [place];  Service: General;  Laterality: Left;  . PORTACATH PLACEMENT  04/08/2011  . WOUND EXPLORATION  10/14/2011   right mastectomy wound    OB History    Gravida Para Term Preterm AB Living   5 2 1 1 3 2    SAB TAB Ectopic Multiple Live Births   3       2      Obstetric Comments   Menarche age 52 [place] P2  First pregnancy to term age 31 1 miscarriage No birth control pills       Home Medications    Prior to Admission medications  Medication Sig Start Date End Date Taking? Authorizing Provider  amLODipine (NORVASC) 10 MG tablet TAKE ONE TABLET BY MOUTH ONCE DAILY 06/06/16   Hoyt Koch, MD  anastrozole (ARIMIDEX) 1 MG tablet TAKE ONE TABLET BY MOUTH ONCE DAILY 08/09/16   Magrinat, Virgie Dad, MD  anastrozole (ARIMIDEX) 1 MG tablet Take 1 tablet (1 mg total) by mouth daily. 11/17/16   Magrinat, Virgie Dad, MD  B Complex-C (SUPER B COMPLEX PO) Take 1 tablet by mouth daily.    [provider]  benazepril-hydrochlorthiazide (LOTENSIN HCT) 20-12.5 MG tablet TAKE ONE TABLET BY MOUTH DAILY 04/25/17   Magrinat, Virgie Dad, MD  goserelin (ZOLADEX) 10.8 MG injection Inject 10.8 mg into the skin every 3 (three) months. 01/18/12   Magrinat, Virgie Dad, MD    HYDROcodone-homatropine [facility]) 5-1.5 MG/5ML syrup Take 5 mLs by mouth every 8 (eight) hours as needed for cough. 09/29/16   Hoyt Koch, MD  metFORMIN (GLUCOPHAGE) 1000 MG tablet Take 1 tablet (1,000 mg total) by mouth 2 (two) times daily with a meal. 03/25/15   Hoyt Koch, MD  naproxen sodium (ANAPROX) 220 MG tablet Take 220 mg by mouth 2 (two) times daily with a meal.    [provider]  pantoprazole (PROTONIX) 40 MG tablet Take 1 tablet (40 mg total) by mouth daily. 03/31/16   Magrinat, Virgie Dad, MD  ranitidine (ZANTAC) 75 MG tablet Take 75 mg by mouth as needed. ONLY IF OUT OF PROTONIX    [provider]    Family History Family History  Problem Relation Age of Onset  . Heart disease Father        heart attack  . Heart attack Father        died age 63  . Hypertension Father   . Hypertension Mother   . Anemia Mother   . Diabetes Mother   . Stroke Mother   . Aneurysm Mother        brain  . Heart disease Maternal Grandmother        MI  . Hypertension Maternal Grandmother   . Diabetes Maternal Grandmother   . Hypertension Brother   . Diabetes Brother   . Hypertension Paternal Grandmother   . Diabetes Paternal Grandmother     Social History Social History  Substance Use Topics  . Smoking status: Former Research scientist (life sciences)  . Smokeless tobacco: Never Used     Comment: quit 1998 - states only smoked for 1 month  . Alcohol use Yes     Comment: rarely     Allergies   Oxycodone and Tape   Review of Systems Review of Systems  Constitutional: Positive for appetite change. Negative for chills and fever.  HENT: Positive for sore throat. Negative for congestion, ear pain and rhinorrhea.   Respiratory: Positive for cough. Negative for shortness of breath.   Cardiovascular: Negative for chest pain.  Gastrointestinal: Positive for nausea. Negative for abdominal pain and vomiting.  Genitourinary: Negative for dysuria.  Musculoskeletal: Positive for  arthralgias and back pain. Negative for gait problem.  Neurological: Positive for weakness and light-headedness. Negative for syncope, numbness and headaches.  All other systems reviewed and are negative.    Physical Exam Updated Vital Signs BP 131/90 (BP Location: Left Arm)   Pulse (!) 111   Temp 98.6 F (37 C) (Oral)   Resp 18   Ht 5\' 7"  (1.702 m)   Wt 134.7 kg (297 lb)   SpO2 100%   BMI 46.52 kg/m  Physical Exam  Constitutional: She is oriented to person, place, and time. She appears well-developed and well-nourished. No distress.  Obese  HENT:  Head: Normocephalic and atraumatic.  Right Ear: Hearing, tympanic membrane, external ear and ear canal normal.  Left Ear: Hearing, tympanic membrane, external ear and ear canal normal.  Nose: Nose normal.  Mouth/Throat: Uvula is midline, oropharynx is clear and moist and mucous membranes are normal.  Eyes: Pupils are equal, round, and reactive to light. Conjunctivae are normal. Right eye exhibits no discharge. Left eye exhibits no discharge. No scleral icterus.  Neck: Normal range of motion.  Cardiovascular: Tachycardia present.  Exam reveals no gallop and no friction rub.   No murmur heard. Pulmonary/Chest: Effort normal and breath sounds normal. No respiratory distress. She has no wheezes. She has no rales. She exhibits tenderness (right sided lower chest tenderness).  Abdominal: She exhibits no distension.  Musculoskeletal:  Back: Inspection: No masses, deformity, or rash Palpation: Right thoracic back tenderness Strength: 5/5 in lower extremities and normal plantar and dorsiflexion Sensation: Intact sensation with light touch in lower extremities bilaterally Gait: Normal gait   Neurological: She is alert and oriented to person, place, and time.  Skin: Skin is warm and dry.  Psychiatric: She has a normal mood and affect. Her behavior is normal.  Nursing note and vitals reviewed.    ED Treatments / Results  Labs (all  labs ordered are listed, but only abnormal results are displayed) Labs Reviewed  URINALYSIS, ROUTINE W REFLEX MICROSCOPIC    EKG  EKG Interpretation None       Radiology No results found.  Procedures Procedures (including critical care time)  Medications Ordered in ED Medications - No data to display   Initial Impression / Assessment and Plan / ED Course  I have reviewed the triage vital signs and the nursing notes.  Pertinent labs & imaging results that were available during my care of the patient were reviewed by me and considered in my medical decision making (see chart for details).  41 year old female presents with lower chest spasms and mid back pain after URI symptoms started last night. She also has complaints of left leg weakness and lightheadedness. She is mildly tachycardic but otherwise vitals are normal. She denies chest pain or SOB. She denies current chest spasming. Likely symptoms are due to URI and cough. Advised rest, fluids, Tylenol/Ibuprofen and will rx muscle relaxer for spasms. Doubt PE but will give strict return precautions if worsening or symptoms are persistent.   Final Clinical Impressions(s) / ED Diagnoses   Final diagnoses:  Acute right-sided thoracic back pain  Cough  Upper respiratory tract infection, unspecified type    New Prescriptions New Prescriptions   No medications on file     Recardo Evangelist, PA-C 05/16/17 [place], MD 05/18/17 1600

## 2017-05-16 NOTE — ED Triage Notes (Signed)
Patient present with cold like s/s. Took some Thera-flu before coming to ED, says that helped a bit. S/S include sore throat, back pain, cough.

## 2017-07-05 ENCOUNTER — Other Ambulatory Visit: Payer: Self-pay | Admitting: Internal Medicine

## 2017-07-18 ENCOUNTER — Other Ambulatory Visit: Payer: Self-pay

## 2017-07-18 DIAGNOSIS — C50411 Malignant neoplasm of upper-outer quadrant of right female breast: Secondary | ICD-10-CM

## 2017-07-18 DIAGNOSIS — Z170 Estrogen receptor positive status [ER+]: Principal | ICD-10-CM

## 2017-07-19 ENCOUNTER — Other Ambulatory Visit (HOSPITAL_BASED_OUTPATIENT_CLINIC_OR_DEPARTMENT_OTHER): Payer: Managed Care, Other (non HMO)

## 2017-07-19 ENCOUNTER — Ambulatory Visit (HOSPITAL_BASED_OUTPATIENT_CLINIC_OR_DEPARTMENT_OTHER): Payer: Managed Care, Other (non HMO)

## 2017-07-19 VITALS — BP 133/70 | HR 82 | Temp 98.2°F | Resp 18

## 2017-07-19 DIAGNOSIS — Z86718 Personal history of other venous thrombosis and embolism: Secondary | ICD-10-CM

## 2017-07-19 DIAGNOSIS — C50411 Malignant neoplasm of upper-outer quadrant of right female breast: Secondary | ICD-10-CM

## 2017-07-19 DIAGNOSIS — Z170 Estrogen receptor positive status [ER+]: Principal | ICD-10-CM

## 2017-07-19 DIAGNOSIS — Z5111 Encounter for antineoplastic chemotherapy: Secondary | ICD-10-CM | POA: Diagnosis not present

## 2017-07-19 LAB — CBC WITH DIFFERENTIAL/PLATELET
BASO%: 1.1 % (ref 0.0–2.0)
Basophils Absolute: 0.1 10*3/uL (ref 0.0–0.1)
EOS%: 3.4 % (ref 0.0–7.0)
Eosinophils Absolute: 0.2 10*3/uL (ref 0.0–0.5)
HCT: 36.1 % (ref 34.8–46.6)
HEMOGLOBIN: 11.2 g/dL — AB (ref 11.6–15.9)
LYMPH%: 25.6 % (ref 14.0–49.7)
MCH: 23.9 pg — ABNORMAL LOW (ref 25.1–34.0)
MCHC: 31 g/dL — ABNORMAL LOW (ref 31.5–36.0)
MCV: 76.9 fL — ABNORMAL LOW (ref 79.5–101.0)
MONO#: 0.7 10*3/uL (ref 0.1–0.9)
MONO%: 10.3 % (ref 0.0–14.0)
NEUT%: 59.6 % (ref 38.4–76.8)
NEUTROS ABS: 3.9 10*3/uL (ref 1.5–6.5)
Platelets: 376 10*3/uL (ref 145–400)
RBC: 4.69 10*6/uL (ref 3.70–5.45)
RDW: 14 % (ref 11.2–14.5)
WBC: 6.5 10*3/uL (ref 3.9–10.3)
lymph#: 1.7 10*3/uL (ref 0.9–3.3)

## 2017-07-19 LAB — COMPREHENSIVE METABOLIC PANEL
ALBUMIN: 3.7 g/dL (ref 3.5–5.0)
ALK PHOS: 123 U/L (ref 40–150)
ALT: 17 U/L (ref 0–55)
ANION GAP: 10 meq/L (ref 3–11)
AST: 19 U/L (ref 5–34)
BUN: 9.1 mg/dL (ref 7.0–26.0)
CALCIUM: 9.5 mg/dL (ref 8.4–10.4)
CHLORIDE: 106 meq/L (ref 98–109)
CO2: 28 mEq/L (ref 22–29)
CREATININE: 0.8 mg/dL (ref 0.6–1.1)
EGFR: >60 mL/min/{1.73_m2} (ref >60)
Glucose: 161 mg/dl — ABNORMAL HIGH (ref 70–140)
POTASSIUM: 3.8 meq/L (ref 3.5–5.1)
Sodium: 143 mEq/L (ref 136–145)
Total Bilirubin: 0.39 mg/dL (ref 0.20–1.20)
Total Protein: 7.6 g/dL (ref 6.4–8.3)

## 2017-07-19 MED ORDER — GOSERELIN ACETATE 10.8 MG ~~LOC~~ IMPL
10.8000 mg | DRUG_IMPLANT | Freq: Once | SUBCUTANEOUS | Status: AC
  Administered 2017-07-19: 10.8 mg via SUBCUTANEOUS
  Filled 2017-07-19: qty 10.8

## 2017-07-19 NOTE — Patient Instructions (Signed)
Goserelin injection What is this medicine? GOSERELIN (GOE se rel in) is similar to a hormone found in the body. It lowers the amount of sex hormones that the body makes. Men will have lower testosterone levels and women will have lower estrogen levels while taking this medicine. In men, this medicine is used to treat prostate cancer; the injection is either given once per month or once every 12 weeks. A once per month injection (only) is used to treat women with endometriosis, dysfunctional uterine bleeding, or advanced breast cancer. This medicine may be used for other purposes; ask your health care provider or pharmacist if you have questions. COMMON BRAND NAME(S): Zoladex What should I tell my health care provider before I take this medicine? They need to know if you have any of these conditions (some only apply to women): -diabetes -heart disease or previous heart attack -high blood pressure -high cholesterol -kidney disease -osteoporosis or low bone density -problems passing urine -spinal cord injury -stroke -tobacco smoker -an unusual or allergic reaction to goserelin, hormone therapy, other medicines, foods, dyes, or preservatives -pregnant or trying to get pregnant -breast-feeding How should I use this medicine? This medicine is for injection under the skin. It is given by a health care professional in a hospital or clinic setting. Men receive this injection once every 4 weeks or once every 12 weeks. Women will only receive the once every 4 weeks injection. Talk to your pediatrician regarding the use of this medicine in children. Special care may be needed. Overdosage: If you think you have taken too much of this medicine contact a poison control center or emergency room at once. NOTE: This medicine is only for you. Do not share this medicine with others. What if I miss a dose? It is important not to miss your dose. Call your doctor or health care professional if you are unable to  keep an appointment. What may interact with this medicine? -female hormones like estrogen -herbal or dietary supplements like black cohosh, chasteberry, or DHEA -female hormones like testosterone -prasterone This list may not describe all possible interactions. Give your health care provider a list of all the medicines, herbs, non-prescription drugs, or dietary supplements you use. Also tell them if you smoke, drink alcohol, or use illegal drugs. Some items may interact with your medicine. What should I watch for while using this medicine? Visit your doctor or health care professional for regular checks on your progress. Your symptoms may appear to get worse during the first weeks of this therapy. Tell your doctor or healthcare professional if your symptoms do not start to get better or if they get worse after this time. Your bones may get weaker if you take this medicine for a long time. If you smoke or frequently drink alcohol you may increase your risk of bone loss. A family history of osteoporosis, chronic use of drugs for seizures (convulsions), or corticosteroids can also increase your risk of bone loss. Talk to your doctor about how to keep your bones strong. This medicine should stop regular monthly menstration in women. Tell your doctor if you continue to menstrate. Women should not become pregnant while taking this medicine or for 12 weeks after stopping this medicine. Women should inform their doctor if they wish to become pregnant or think they might be pregnant. There is a potential for serious side effects to an unborn child. Talk to your health care professional or pharmacist for more information. Do not breast-feed an infant while taking   this medicine. Men should inform their doctors if they wish to father a child. This medicine may lower sperm counts. Talk to your health care professional or pharmacist for more information. What side effects may I notice from receiving this  medicine? Side effects that you should report to your doctor or health care professional as soon as possible: -allergic reactions like skin rash, itching or hives, swelling of the face, lips, or tongue -bone pain -breathing problems -changes in vision -chest pain -feeling faint or lightheaded, falls -fever, chills -pain, swelling, warmth in the leg -pain, tingling, numbness in the hands or feet -signs and symptoms of low blood pressure like dizziness; feeling faint or lightheaded, falls; unusually weak or tired -stomach pain -swelling of the ankles, feet, hands -trouble passing urine or change in the amount of urine -unusually high or low blood pressure -unusually weak or tired Side effects that usually do not require medical attention (report to your doctor or health care professional if they continue or are bothersome): -change in sex drive or performance -changes in breast size in both males and females -changes in emotions or moods -headache -hot flashes -irritation at site where injected -loss of appetite -skin problems like acne, dry skin -vaginal dryness This list may not describe all possible side effects. Call your doctor for medical advice about side effects. You may report side effects to FDA at 1-800-FDA-1088. Where should I keep my medicine? This drug is given in a hospital or clinic and will not be stored at home. NOTE: This sheet is a summary. It may not cover all possible information. If you have questions about this medicine, talk to your doctor, pharmacist, or health care provider.  2015, Elsevier/Gold Standard. (2013-10-15 11:10:35)  

## 2017-08-23 ENCOUNTER — Emergency Department (HOSPITAL_COMMUNITY): Payer: Managed Care, Other (non HMO)

## 2017-08-23 ENCOUNTER — Encounter (HOSPITAL_COMMUNITY): Payer: Self-pay | Admitting: Emergency Medicine

## 2017-08-23 ENCOUNTER — Emergency Department (HOSPITAL_COMMUNITY)
Admission: EM | Admit: 2017-08-23 | Discharge: 2017-08-24 | Disposition: A | Discharge status: Home / Self Care | Payer: Managed Care, Other (non HMO) | Attending: Emergency Medicine | Admitting: Emergency Medicine

## 2017-08-23 DIAGNOSIS — R0602 Shortness of breath: Secondary | ICD-10-CM | POA: Insufficient documentation

## 2017-08-23 DIAGNOSIS — R072 Precordial pain: Secondary | ICD-10-CM | POA: Insufficient documentation

## 2017-08-23 DIAGNOSIS — Z853 Personal history of malignant neoplasm of breast: Secondary | ICD-10-CM | POA: Diagnosis not present

## 2017-08-23 DIAGNOSIS — Z79899 Other long term (current) drug therapy: Secondary | ICD-10-CM | POA: Insufficient documentation

## 2017-08-23 DIAGNOSIS — Z7984 Long term (current) use of oral hypoglycemic drugs: Secondary | ICD-10-CM | POA: Diagnosis not present

## 2017-08-23 DIAGNOSIS — R110 Nausea: Secondary | ICD-10-CM | POA: Diagnosis not present

## 2017-08-23 DIAGNOSIS — R109 Unspecified abdominal pain: Secondary | ICD-10-CM | POA: Insufficient documentation

## 2017-08-23 DIAGNOSIS — Z87891 Personal history of nicotine dependence: Secondary | ICD-10-CM | POA: Diagnosis not present

## 2017-08-23 DIAGNOSIS — E119 Type 2 diabetes mellitus without complications: Secondary | ICD-10-CM | POA: Diagnosis not present

## 2017-08-23 DIAGNOSIS — I10 Essential (primary) hypertension: Secondary | ICD-10-CM | POA: Diagnosis not present

## 2017-08-23 DIAGNOSIS — M545 Low back pain: Secondary | ICD-10-CM | POA: Diagnosis not present

## 2017-08-23 LAB — BASIC METABOLIC PANEL
ANION GAP: 7 (ref 5–15)
BUN: 11 mg/dL (ref 6–20)
CALCIUM: 9.2 mg/dL (ref 8.9–10.3)
CO2: 28 mmol/L (ref 22–32)
CREATININE: 0.56 mg/dL (ref 0.44–1.00)
Chloride: 105 mmol/L (ref 101–111)
GLUCOSE: 158 mg/dL — AB (ref 65–99)
Potassium: 3.9 mmol/L (ref 3.5–5.1)
Sodium: 140 mmol/L (ref 135–145)

## 2017-08-23 LAB — CBC
HCT: 34.3 % — ABNORMAL LOW (ref 36.0–46.0)
HEMOGLOBIN: 10.6 g/dL — AB (ref 12.0–15.0)
MCH: 24.2 pg — ABNORMAL LOW (ref 26.0–34.0)
MCHC: 30.9 g/dL (ref 30.0–36.0)
MCV: 78.3 fL (ref 78.0–100.0)
PLATELETS: 359 10*3/uL (ref 150–400)
RBC: 4.38 MIL/uL (ref 3.87–5.11)
RDW: 14 % (ref 11.5–15.5)
WBC: 7.4 10*3/uL (ref 4.0–10.5)

## 2017-08-23 LAB — I-STAT BETA HCG BLOOD, ED (MC, WL, AP ONLY): I-stat hCG, quantitative: <5 m[IU]/mL (ref <5)

## 2017-08-23 LAB — I-STAT TROPONIN, ED: TROPONIN I, POC: 0 ng/mL (ref 0.00–0.08)

## 2017-08-23 NOTE — ED Triage Notes (Signed)
Patient c/o [place] chest pain with back pain and nausea x3 days. Speaking in full sentences without difficulty.

## 2017-08-24 LAB — I-STAT TROPONIN, ED: TROPONIN I, POC: 0 ng/mL (ref 0.00–0.08)

## 2017-08-24 MED ORDER — BENAZEPRIL-HYDROCHLOROTHIAZIDE 20-12.5 MG PO TABS: 1.0000 | ORAL_TABLET | Freq: Every day | ORAL | 0 refills | Status: DC

## 2017-08-24 MED ORDER — AMLODIPINE BESYLATE 10 MG PO TABS: 10.0000 mg | ORAL_TABLET | Freq: Every day | ORAL | 0 refills | Status: DC

## 2017-08-24 NOTE — ED Provider Notes (Signed)
[facility] DEPT Provider Note   CSN: [id number] Arrival date & time: 08/23/17  2055     History   Chief Complaint Chief Complaint  Patient presents with  . Chest Pain    HPI [name] is a 42 y.o. female.  The history is provided by the patient.  Chest Pain   This is a new problem. The current episode started more than 2 days ago. The problem occurs daily. The problem has not changed since onset.The quality of the pain is described as sharp. The pain does not radiate. The symptoms are aggravated by certain positions and deep breathing. Associated symptoms include abdominal pain, back pain, nausea, shortness of breath and sputum production. Pertinent negatives include no fever, no hemoptysis and no lower extremity edema. Associated symptoms comments: Unrelated back spasms. She has tried nothing for the symptoms. Risk factors include obesity.  Pertinent negatives for past medical history include no CAD.  's patient with history of breast cancer that is in remission, history of hypertension, history of diabetes presents with left-sided sharp chest pain is been present for up to 2-3 days It Is worse with palpation and also breathing She also reports unrelated lower back spasms The chest pain does not radiate, there is no tearing sensation into her back She reports mild shortness of breath She admits that she is not taking her blood pressure medications Past Medical History:  Diagnosis Date  . Anemia   . Back pain    lumbar   . Breast cancer ([place]) 2012   right  . Cough 09/07/2012  . Depression    no current med.  . Diabetes mellitus without complication ([place])   . Gastric ulcer   . GERD (gastroesophageal reflux disease)   . H/O scoliosis   . Headache(784.0)    hx. migraine in childhood  . Hypertension    under control with med., has been on med. x 5 yr.  . Peripheral neuropathy    s/p chemotherapy  . S/P radiation therapy 12/19/11 - 02/03/12     Right Chest Wall and Axilla  . Status post chemotherapy    finished 08/2011    Patient Active Problem List   Diagnosis Date Noted  . Influenza-like illness 09/29/2016  . Diabetes mellitus type II, non insulin dependent ([place]) 10/05/2015  . Obesity, morbid, BMI 40.0-49.9 ([place]) 10/05/2015  . Obesity 05/23/2014  . Diabetes type 2, uncontrolled ([place]) 05/23/2014  . Malignant neoplasm of upper-outer quadrant of right breast in female, estrogen receptor positive ([place]) 09/09/2013  . History of DVT (deep vein thrombosis) 01/09/2012  . Peripheral neuropathy   . Iron deficiency anemia 06/29/2011  . Hypertension 02/28/2011    Past Surgical History:  Procedure Laterality Date  . ESOPHAGOGASTRODUODENOSCOPY (EGD) WITH PROPOFOL  09/06/2012   Procedure: ESOPHAGOGASTRODUODENOSCOPY (EGD) WITH PROPOFOL;  Surgeon: Milus Banister, MD;  Location: [facility];  Service: Endoscopy;  Laterality: N/A;  . HIP PINNING  age 47   bilateral  . MASTECTOMY W/ SENTINEL NODE BIOPSY  10/14/2011   Procedure: MASTECTOMY WITH SENTINEL LYMPH NODE BIOPSY;  Surgeon: Merrie Roof, MD;  Location: [facility];  Service: General;  Laterality: Right;  . PORT-A-CATH REMOVAL  09/12/2012   Procedure: REMOVAL PORT-A-CATH;  Surgeon: Merrie Roof, MD;  Location: [facility];  Service: General;  Laterality: Left;  . PORTACATH PLACEMENT  04/08/2011  . WOUND EXPLORATION  10/14/2011   right mastectomy wound    OB History  Gravida Para Term Preterm AB Living   5 2 1 1 3 2    SAB TAB Ectopic Multiple Live Births   3       2      Obstetric Comments   Menarche age 63 [place] P2  First pregnancy to term age 66 1 miscarriage No birth control pills       Home Medications    Prior to Admission medications   Medication Sig Start Date End Date Taking? Authorizing Provider  amLODipine (NORVASC) 10 MG tablet Take 1 tablet (10 mg total) daily by mouth. Need annual visit for further refills 07/06/17   Hoyt Koch,  MD  anastrozole (ARIMIDEX) 1 MG tablet TAKE ONE TABLET BY MOUTH ONCE DAILY 08/09/16   Magrinat, Virgie Dad, MD  anastrozole (ARIMIDEX) 1 MG tablet Take 1 tablet (1 mg total) by mouth daily. 11/17/16   Magrinat, Virgie Dad, MD  B Complex-C (SUPER B COMPLEX PO) Take 1 tablet by mouth daily.    [provider]  benazepril-hydrochlorthiazide (LOTENSIN HCT) 20-12.5 MG tablet TAKE ONE TABLET BY MOUTH DAILY 04/25/17   Magrinat, Virgie Dad, MD  cyclobenzaprine (FLEXERIL) 10 MG tablet Take 1 tablet (10 mg total) by mouth 2 (two) times daily as needed for muscle spasms. 05/16/17   Recardo Evangelist, PA-C  goserelin (ZOLADEX) 10.8 MG injection Inject 10.8 mg into the skin every 3 (three) months. 01/18/12   Magrinat, Virgie Dad, MD  HYDROcodone-homatropine [facility] 5-1.5 MG/5ML syrup Take 5 mLs by mouth every 8 (eight) hours as needed for cough. 09/29/16   Hoyt Koch, MD  metFORMIN (GLUCOPHAGE) 1000 MG tablet Take 1 tablet (1,000 mg total) by mouth 2 (two) times daily with a meal. 03/25/15   Hoyt Koch, MD  naproxen sodium (ANAPROX) 220 MG tablet Take 220 mg by mouth 2 (two) times daily with a meal.    [provider]  pantoprazole (PROTONIX) 40 MG tablet Take 1 tablet (40 mg total) by mouth daily. 03/31/16   Magrinat, Virgie Dad, MD  ranitidine (ZANTAC) 75 MG tablet Take 75 mg by mouth as needed. ONLY IF OUT OF PROTONIX    [provider]    Family History Family History  Problem Relation Age of Onset  . Heart disease Father        heart attack  . Heart attack Father        died age 18  . Hypertension Father   . Hypertension Mother   . Anemia Mother   . Diabetes Mother   . Stroke Mother   . Aneurysm Mother        brain  . Heart disease Maternal Grandmother        MI  . Hypertension Maternal Grandmother   . Diabetes Maternal Grandmother   . Hypertension Brother   . Diabetes Brother   . Hypertension Paternal Grandmother   . Diabetes Paternal Grandmother      Social History Social History   Tobacco Use  . Smoking status: Former Research scientist (life sciences)  . Smokeless tobacco: Never Used  . Tobacco comment: quit 1998 - states only smoked for 1 month  Substance Use Topics  . Alcohol use: Yes    Comment: rarely  . Drug use: No     Allergies   Oxycodone and Tape   Review of Systems Review of Systems  Constitutional: Negative for fever.  Respiratory: Positive for sputum production and shortness of breath. Negative for hemoptysis.   Cardiovascular: Positive for chest pain.  Gastrointestinal: Positive for abdominal pain and nausea.  Musculoskeletal: Positive for back pain.  All other systems reviewed and are negative.    Physical Exam Updated Vital Signs BP (!) 178/125 (BP Location: Left Arm)   Pulse 66   Temp 98.4 F (36.9 C) (Oral)   Resp 12   SpO2 100%   Physical Exam  CONSTITUTIONAL: Well developed/well nourished HEAD: Normocephalic/atraumatic EYES: EOMI/PERRL ENMT: Mucous membranes moist NECK: supple no meningeal signs SPINE/BACK:entire spine nontender, mild lumbar paraspinal tenderness CV: S1/S2 noted, no murmurs/rubs/gallops noted LUNGS: Lungs are clear to auscultation bilaterally, no apparent distress Chest - point tenderness to left chest, no bruising or crepitus ABDOMEN: soft, nontender, no rebound or guarding, bowel sounds noted throughout abdomen, obese GU:no cva tenderness NEURO: Pt is awake/alert/appropriate, moves all extremitiesx4.  No facial droop.   EXTREMITIES: pulses normal/equal, full ROM, no calf tenderness or edema SKIN: warm, color normal PSYCH: no abnormalities of mood noted, alert and oriented to situation  ED Treatments / Results  Labs (all labs ordered are listed, but only abnormal results are displayed) Labs Reviewed  BASIC METABOLIC PANEL - Abnormal; Notable for the following components:      Result Value   Glucose, Bld 158 (*)    All other components within normal limits  CBC - Abnormal; Notable  for the following components:   Hemoglobin 10.6 (*)    HCT 34.3 (*)    MCH 24.2 (*)    All other components within normal limits  I-STAT TROPONIN, ED  I-STAT BETA HCG BLOOD, ED ([facility], [facility], [facility] ONLY)  I-STAT TROPONIN, ED    EKG  EKG Interpretation  Date/Time:  Wednesday August 23 2017 21:34:53 EST Ventricular Rate:  71 PR Interval:    QRS Duration: 82 QT Interval:  406 QTC Calculation: 442 R Axis:   15 Text Interpretation:  Sinus rhythm No significant change since last tracing Confirmed by Ripley Fraise [phone]) on 08/23/2017 11:54:04 PM       Radiology Dg Chest 2 View  Result Date: 08/23/2017 CLINICAL DATA:  Mid chest pain and tightness for three days, shortness of breath tonight and pain is worse EXAM: CHEST  2 VIEW COMPARISON:  04/08/2011 FINDINGS: Normal heart size and pulmonary vascularity. No focal airspace disease or consolidation in the lungs. No blunting of costophrenic angles. No pneumothorax. Mediastinal contours appear intact. Surgical clips in the right axilla. IMPRESSION: No active cardiopulmonary disease. Electronically Signed   By: Lucienne Capers M.D.   On: 08/23/2017 21:53    Procedures Procedures (including critical care time)  Medications Ordered in ED Medications - No data to display   Initial Impression / Assessment and Plan / ED Course  I have reviewed the triage vital signs and the nursing notes.  Pertinent labs & imaging results that were available during my care of the patient were reviewed by me and considered in my medical decision making (see chart for details).     12:16 AM HEART score - 2 Patient reports distant history of DVT, but at this point with reproducible chest pain my suspicion for ACS or PE or dissection at this time is low She admits to noncompliance of BP meds, will re-prescribe 1:07 AM Repeat troponin is negative. Patient feels improved I will discharge patient home Will re-prescribe her BP meds until she can see her PCP We  discussed strict return precautions Final Clinical Impressions(s) / ED Diagnoses   Final diagnoses:  Precordial pain  Essential hypertension    ED Discharge Orders  Ordered    amLODipine (NORVASC) 10 MG tablet  Daily     08/24/17 0104    benazepril-hydrochlorthiazide (LOTENSIN HCT) 20-12.5 MG tablet  Daily     08/24/17 0104       Ripley Fraise, MD 08/24/17 0107

## 2017-08-24 NOTE — Discharge Instructions (Signed)

## 2017-09-13 ENCOUNTER — Other Ambulatory Visit: Payer: Self-pay | Admitting: *Deleted

## 2017-09-13 MED ORDER — BENAZEPRIL-HYDROCHLOROTHIAZIDE 20-12.5 MG PO TABS: 1.0000 | ORAL_TABLET | Freq: Every day | ORAL | 1 refills | Status: DC

## 2017-09-13 MED ORDER — AMLODIPINE BESYLATE 10 MG PO TABS: 10.0000 mg | ORAL_TABLET | Freq: Every day | ORAL | 1 refills | Status: DC

## 2017-10-19 ENCOUNTER — Inpatient Hospital Stay: Payer: Managed Care, Other (non HMO)

## 2017-10-19 ENCOUNTER — Inpatient Hospital Stay: Payer: Managed Care, Other (non HMO) | Attending: Oncology

## 2017-10-19 VITALS — BP 134/68 | HR 80 | Temp 98.2°F | Resp 18

## 2017-10-19 DIAGNOSIS — C50411 Malignant neoplasm of upper-outer quadrant of right female breast: Secondary | ICD-10-CM

## 2017-10-19 DIAGNOSIS — Z170 Estrogen receptor positive status [ER+]: Principal | ICD-10-CM

## 2017-10-19 DIAGNOSIS — Z86718 Personal history of other venous thrombosis and embolism: Secondary | ICD-10-CM

## 2017-10-19 DIAGNOSIS — Z5111 Encounter for antineoplastic chemotherapy: Secondary | ICD-10-CM | POA: Insufficient documentation

## 2017-10-19 LAB — CBC WITH DIFFERENTIAL/PLATELET
Basophils Absolute: 0.1 10*3/uL (ref 0.0–0.1)
Basophils Relative: 1 %
EOS ABS: 0.2 10*3/uL (ref 0.0–0.5)
EOS PCT: 3 %
HCT: 35.3 % (ref 34.8–46.6)
Hemoglobin: 11 g/dL — ABNORMAL LOW (ref 11.6–15.9)
LYMPHS ABS: 2 10*3/uL (ref 0.9–3.3)
LYMPHS PCT: 29 %
MCH: 23.8 pg — AB (ref 25.1–34.0)
MCHC: 31.2 g/dL — AB (ref 31.5–36.0)
MCV: 76.3 fL — AB (ref 79.5–101.0)
MONO ABS: 0.6 10*3/uL (ref 0.1–0.9)
MONOS PCT: 8 %
Neutro Abs: 4.2 10*3/uL (ref 1.5–6.5)
Neutrophils Relative %: 59 %
PLATELETS: 358 10*3/uL (ref 145–400)
RBC: 4.62 MIL/uL (ref 3.70–5.45)
RDW: 14.2 % (ref 11.2–14.5)
WBC: 7 10*3/uL (ref 3.9–10.3)

## 2017-10-19 LAB — COMPREHENSIVE METABOLIC PANEL
ALT: 18 U/L (ref 0–55)
ANION GAP: 10 (ref 3–11)
AST: 16 U/L (ref 5–34)
Albumin: 3.7 g/dL (ref 3.5–5.0)
Alkaline Phosphatase: 146 U/L (ref 40–150)
BUN: 13 mg/dL (ref 7–26)
CHLORIDE: 103 mmol/L (ref 98–109)
CO2: 29 mmol/L (ref 22–29)
CREATININE: 0.85 mg/dL (ref 0.60–1.10)
Calcium: 9.8 mg/dL (ref 8.4–10.4)
GFR calc non Af Amer: >60 mL/min (ref >60)
Glucose, Bld: 202 mg/dL — ABNORMAL HIGH (ref 70–140)
Potassium: 4 mmol/L (ref 3.5–5.1)
SODIUM: 142 mmol/L (ref 136–145)
Total Bilirubin: 0.3 mg/dL (ref 0.2–1.2)
Total Protein: 7.7 g/dL (ref 6.4–8.3)

## 2017-10-19 MED ORDER — GOSERELIN ACETATE 10.8 MG ~~LOC~~ IMPL
10.8000 mg | DRUG_IMPLANT | Freq: Once | SUBCUTANEOUS | Status: AC
  Administered 2017-10-19: 10.8 mg via SUBCUTANEOUS
  Filled 2017-10-19: qty 10.8

## 2017-10-19 NOTE — Patient Instructions (Signed)
Goserelin injection What is this medicine? GOSERELIN (GOE se rel in) is similar to a hormone found in the body. It lowers the amount of sex hormones that the body makes. Men will have lower testosterone levels and women will have lower estrogen levels while taking this medicine. In men, this medicine is used to treat prostate cancer; the injection is either given once per month or once every 12 weeks. A once per month injection (only) is used to treat women with endometriosis, dysfunctional uterine bleeding, or advanced breast cancer. This medicine may be used for other purposes; ask your health care provider or pharmacist if you have questions. COMMON BRAND NAME(S): Zoladex What should I tell my health care provider before I take this medicine? They need to know if you have any of these conditions (some only apply to women): -diabetes -heart disease or previous heart attack -high blood pressure -high cholesterol -kidney disease -osteoporosis or low bone density -problems passing urine -spinal cord injury -stroke -tobacco smoker -an unusual or allergic reaction to goserelin, hormone therapy, other medicines, foods, dyes, or preservatives -pregnant or trying to get pregnant -breast-feeding How should I use this medicine? This medicine is for injection under the skin. It is given by a health care professional in a hospital or clinic setting. Men receive this injection once every 4 weeks or once every 12 weeks. Women will only receive the once every 4 weeks injection. Talk to your pediatrician regarding the use of this medicine in children. Special care may be needed. Overdosage: If you think you have taken too much of this medicine contact a poison control center or emergency room at once. NOTE: This medicine is only for you. Do not share this medicine with others. What if I miss a dose? It is important not to miss your dose. Call your doctor or health care professional if you are unable to  keep an appointment. What may interact with this medicine? -female hormones like estrogen -herbal or dietary supplements like black cohosh, chasteberry, or DHEA -female hormones like testosterone -prasterone This list may not describe all possible interactions. Give your health care provider a list of all the medicines, herbs, non-prescription drugs, or dietary supplements you use. Also tell them if you smoke, drink alcohol, or use illegal drugs. Some items may interact with your medicine. What should I watch for while using this medicine? Visit your doctor or health care professional for regular checks on your progress. Your symptoms may appear to get worse during the first weeks of this therapy. Tell your doctor or healthcare professional if your symptoms do not start to get better or if they get worse after this time. Your bones may get weaker if you take this medicine for a long time. If you smoke or frequently drink alcohol you may increase your risk of bone loss. A family history of osteoporosis, chronic use of drugs for seizures (convulsions), or corticosteroids can also increase your risk of bone loss. Talk to your doctor about how to keep your bones strong. This medicine should stop regular monthly menstration in women. Tell your doctor if you continue to menstrate. Women should not become pregnant while taking this medicine or for 12 weeks after stopping this medicine. Women should inform their doctor if they wish to become pregnant or think they might be pregnant. There is a potential for serious side effects to an unborn child. Talk to your health care professional or pharmacist for more information. Do not breast-feed an infant while taking   this medicine. Men should inform their doctors if they wish to father a child. This medicine may lower sperm counts. Talk to your health care professional or pharmacist for more information. What side effects may I notice from receiving this  medicine? Side effects that you should report to your doctor or health care professional as soon as possible: -allergic reactions like skin rash, itching or hives, swelling of the face, lips, or tongue -bone pain -breathing problems -changes in vision -chest pain -feeling faint or lightheaded, falls -fever, chills -pain, swelling, warmth in the leg -pain, tingling, numbness in the hands or feet -signs and symptoms of low blood pressure like dizziness; feeling faint or lightheaded, falls; unusually weak or tired -stomach pain -swelling of the ankles, feet, hands -trouble passing urine or change in the amount of urine -unusually high or low blood pressure -unusually weak or tired Side effects that usually do not require medical attention (report to your doctor or health care professional if they continue or are bothersome): -change in sex drive or performance -changes in breast size in both males and females -changes in emotions or moods -headache -hot flashes -irritation at site where injected -loss of appetite -skin problems like acne, dry skin -vaginal dryness This list may not describe all possible side effects. Call your doctor for medical advice about side effects. You may report side effects to FDA at 1-800-FDA-1088. Where should I keep my medicine? This drug is given in a hospital or clinic and will not be stored at home. NOTE: This sheet is a summary. It may not cover all possible information. If you have questions about this medicine, talk to your doctor, pharmacist, or health care provider.  2018 Elsevier/Gold Standard (2013-10-15 11:10:35)  

## 2017-11-12 ENCOUNTER — Encounter (HOSPITAL_COMMUNITY): Payer: Self-pay | Admitting: *Deleted

## 2017-11-12 ENCOUNTER — Other Ambulatory Visit: Payer: Self-pay

## 2017-11-12 ENCOUNTER — Ambulatory Visit (HOSPITAL_COMMUNITY)
Admission: EM | Admit: 2017-11-12 | Discharge: 2017-11-12 | Disposition: A | Discharge status: Home / Self Care | Payer: Managed Care, Other (non HMO) | Attending: Physician Assistant | Admitting: Physician Assistant

## 2017-11-12 DIAGNOSIS — R51 Headache: Secondary | ICD-10-CM | POA: Diagnosis not present

## 2017-11-12 DIAGNOSIS — R519 Headache, unspecified: Secondary | ICD-10-CM

## 2017-11-12 MED ORDER — AMLODIPINE BESYLATE 10 MG PO TABS: 10.0000 mg | ORAL_TABLET | Freq: Every day | ORAL | 0 refills | Status: DC

## 2017-11-12 MED ORDER — CLONIDINE HCL 0.1 MG PO TABS: 0.1000 mg | ORAL_TABLET | Freq: Once | ORAL | Status: DC

## 2017-11-12 MED ORDER — CLONIDINE HCL 0.1 MG PO TABS
ORAL_TABLET | ORAL | Status: AC
  Filled 2017-11-12: qty 1

## 2017-11-12 MED ORDER — BENAZEPRIL-HYDROCHLOROTHIAZIDE 20-12.5 MG PO TABS: 1.0000 | ORAL_TABLET | Freq: Every day | ORAL | 0 refills | Status: DC

## 2017-11-12 NOTE — ED Triage Notes (Addendum)
Per pt she is having headaches, nausea, per pt she is out of her BP med and needs refills, per pt she will make appt with PCP tomorrow, per pt she just fell out of the truck today trying to vomit and landed on her back and thinks she hit her head

## 2017-11-12 NOTE — Discharge Instructions (Addendum)
Your blood pressure imporved on recheck.  Please take the medication and try not to miss doses.  Please call you PCP's office tomorrow.  If you cant get in in time then come back here of see me at [address] so I can refill another 30 days if needed.  If at any point you feel worse you need to go to the emergency room.

## 2017-11-12 NOTE — ED Provider Notes (Signed)
11/12/2017 5:57 PM   DOB: 19-Apr-1976 / MRN: [id number]  SUBJECTIVE:  [name] is a 42 y.o. female presenting for high blood pressure medication refills.  Reports that she has a headache at this time.  States she ran out of her medication about 3 weeks ago and "thought she could make it."  She is taking multiple therapies at this time.  Right now she is denying nausea, dizziness, shortness of breath, chest pain.  She is allergic to oxycodone and tape.   She  has a past medical history of Anemia, Back pain, Breast cancer ([place]) (2012), Cough (09/07/2012), Depression, Diabetes mellitus without complication ([place]), Gastric ulcer, GERD (gastroesophageal reflux disease), H/O scoliosis, Headache(784.0), Hypertension, Peripheral neuropathy, S/P radiation therapy (12/19/11 - 02/03/12), and Status post chemotherapy.    She  reports that she has quit smoking. She has never used smokeless tobacco. She reports that she drinks alcohol. She reports that she does not use drugs. She  reports that she currently engages in sexual activity. She reports using the following methods of birth control/protection: None and Injection. The patient  has a past surgical history that includes Hip pinning (age 42); Portacath placement (04/08/2011); Mastectomy w/ sentinel node biopsy (10/14/2011); Wound exploration (10/14/2011); Esophagogastroduodenoscopy (egd) with propofol (09/06/2012); and Port-a-cath removal (09/12/2012).  Her family history includes Anemia in her mother; Aneurysm in her mother; Diabetes in her brother, maternal grandmother, mother, and paternal grandmother; Heart attack in her father; Heart disease in her father and maternal grandmother; Hypertension in her brother, father, maternal grandmother, mother, and paternal grandmother; Stroke in her mother.  Review of Systems  Constitutional: Negative for chills, diaphoresis and fever.  Eyes: Negative.   Respiratory: Negative for cough, hemoptysis, sputum production,  shortness of breath and wheezing.   Cardiovascular: Negative for chest pain, orthopnea and leg swelling.  Gastrointestinal: Negative for nausea.  Skin: Negative for rash.  Neurological: Negative for dizziness, sensory change, speech change, focal weakness and headaches.    OBJECTIVE:  BP (!) 159/92 (BP Location: Right Arm) Comment (BP Location): large cuff  Pulse 84   Temp 98.5 F (36.9 C) (Oral)   Resp 18   SpO2 100%   Physical Exam  Constitutional: She is oriented to person, place, and time.  Eyes: Pupils are equal, round, and reactive to light. EOM are normal.  Cardiovascular: Normal rate, regular rhythm, normal heart sounds and intact distal pulses. Exam reveals no gallop and no friction rub.  No murmur heard. Pulmonary/Chest: Effort normal and breath sounds normal. No respiratory distress. She has no wheezes. She has no rales. She exhibits no tenderness.  Neurological: She is alert and oriented to person, place, and time. She has normal strength and normal reflexes. She is not disoriented. She displays no atrophy and no tremor. No cranial nerve deficit or sensory deficit. She exhibits normal muscle tone. She displays no seizure activity. Coordination and gait normal. GCS eye subscore is 4. GCS verbal subscore is 5. GCS motor subscore is 6.  Psychiatric: Her behavior is normal.    No results found for this or any previous visit (from the past 72 hour(s)).  No results found.  ASSESSMENT AND PLAN:  Orders Placed This Encounter  Procedures  . Recheck vitals    Please recheck blood pressure roughly 30 minutes after clonidine.    Standing Status:   Standing    Number of Occurrences:   1     Nonintractable headache, unspecified chronicity pattern, unspecified headache type most likely secondary to her  elevated blood pressure which did improve on recheck with a large cuff.  She has a normal neurological exam at this time.  I am going to refill her blood pressure medications and  have advised if she has any worsening in symptoms that she needs to go to the ED for further evaluation and management.      The patient is advised to call or return to clinic if she does not see an improvement in symptoms, or to seek the care of the closest emergency department if she worsens with the above plan.   Philis Fendt, MHS, PA-C 11/12/2017 5:57 PM    Tereasa Coop, PA-C 11/12/17 1759

## 2017-11-13 ENCOUNTER — Other Ambulatory Visit: Payer: Self-pay | Admitting: Oncology

## 2018-01-14 ENCOUNTER — Other Ambulatory Visit: Payer: Self-pay | Admitting: Oncology

## 2018-01-16 ENCOUNTER — Inpatient Hospital Stay: Payer: Managed Care, Other (non HMO)

## 2018-01-22 ENCOUNTER — Inpatient Hospital Stay: Payer: Managed Care, Other (non HMO)

## 2018-01-23 ENCOUNTER — Other Ambulatory Visit: Payer: Self-pay | Admitting: Oncology

## 2018-01-23 ENCOUNTER — Inpatient Hospital Stay: Payer: Managed Care, Other (non HMO)

## 2018-01-23 ENCOUNTER — Inpatient Hospital Stay: Payer: Managed Care, Other (non HMO) | Attending: Oncology

## 2018-01-23 VITALS — BP 147/86 | HR 90 | Temp 98.5°F | Resp 20

## 2018-01-23 DIAGNOSIS — C50411 Malignant neoplasm of upper-outer quadrant of right female breast: Secondary | ICD-10-CM | POA: Insufficient documentation

## 2018-01-23 DIAGNOSIS — Z170 Estrogen receptor positive status [ER+]: Secondary | ICD-10-CM

## 2018-01-23 DIAGNOSIS — Z86718 Personal history of other venous thrombosis and embolism: Secondary | ICD-10-CM

## 2018-01-23 DIAGNOSIS — Z5111 Encounter for antineoplastic chemotherapy: Secondary | ICD-10-CM | POA: Diagnosis present

## 2018-01-23 LAB — CBC WITH DIFFERENTIAL/PLATELET
BASOS ABS: 0.1 10*3/uL (ref 0.0–0.1)
BASOS PCT: 1 %
EOS ABS: 0.2 10*3/uL (ref 0.0–0.5)
Eosinophils Relative: 3 %
HEMATOCRIT: 34.9 % (ref 34.8–46.6)
HEMOGLOBIN: 11.1 g/dL — AB (ref 11.6–15.9)
Lymphocytes Relative: 25 %
Lymphs Abs: 1.7 10*3/uL (ref 0.9–3.3)
MCH: 24.4 pg — ABNORMAL LOW (ref 25.1–34.0)
MCHC: 31.9 g/dL (ref 31.5–36.0)
MCV: 76.4 fL — ABNORMAL LOW (ref 79.5–101.0)
MONOS PCT: 7 %
Monocytes Absolute: 0.5 10*3/uL (ref 0.1–0.9)
NEUTROS ABS: 4.3 10*3/uL (ref 1.5–6.5)
NEUTROS PCT: 64 %
Platelets: 362 10*3/uL (ref 145–400)
RBC: 4.57 MIL/uL (ref 3.70–5.45)
RDW: 14.1 % (ref 11.2–14.5)
WBC: 6.8 10*3/uL (ref 3.9–10.3)

## 2018-01-23 LAB — COMPREHENSIVE METABOLIC PANEL
ALBUMIN: 3.9 g/dL (ref 3.5–5.0)
ALK PHOS: 146 U/L (ref 40–150)
ALT: 15 U/L (ref 0–55)
ANION GAP: 10 (ref 3–11)
AST: 16 U/L (ref 5–34)
BILIRUBIN TOTAL: 0.2 mg/dL (ref 0.2–1.2)
BUN: 15 mg/dL (ref 7–26)
CALCIUM: 10 mg/dL (ref 8.4–10.4)
CO2: 27 mmol/L (ref 22–29)
Chloride: 100 mmol/L (ref 98–109)
Creatinine, Ser: 1.08 mg/dL (ref 0.60–1.10)
GFR calc Af Amer: >60 mL/min (ref >60)
GFR calc non Af Amer: >60 mL/min (ref >60)
GLUCOSE: 336 mg/dL — AB (ref 70–140)
Potassium: 3.6 mmol/L (ref 3.5–5.1)
SODIUM: 137 mmol/L (ref 136–145)
TOTAL PROTEIN: 8.1 g/dL (ref 6.4–8.3)

## 2018-01-23 MED ORDER — GOSERELIN ACETATE 10.8 MG ~~LOC~~ IMPL
10.8000 mg | DRUG_IMPLANT | Freq: Once | SUBCUTANEOUS | Status: AC
  Administered 2018-01-23: 10.8 mg via SUBCUTANEOUS
  Filled 2018-01-23: qty 10.8

## 2018-01-23 NOTE — Patient Instructions (Signed)
Goserelin injection What is this medicine? GOSERELIN (GOE se rel in) is similar to a hormone found in the body. It lowers the amount of sex hormones that the body makes. Men will have lower testosterone levels and women will have lower estrogen levels while taking this medicine. In men, this medicine is used to treat prostate cancer; the injection is either given once per month or once every 12 weeks. A once per month injection (only) is used to treat women with endometriosis, dysfunctional uterine bleeding, or advanced breast cancer. This medicine may be used for other purposes; ask your health care provider or pharmacist if you have questions. COMMON BRAND NAME(S): Zoladex What should I tell my health care provider before I take this medicine? They need to know if you have any of these conditions (some only apply to women): -diabetes -heart disease or previous heart attack -high blood pressure -high cholesterol -kidney disease -osteoporosis or low bone density -problems passing urine -spinal cord injury -stroke -tobacco smoker -an unusual or allergic reaction to goserelin, hormone therapy, other medicines, foods, dyes, or preservatives -pregnant or trying to get pregnant -breast-feeding How should I use this medicine? This medicine is for injection under the skin. It is given by a health care professional in a hospital or clinic setting. Men receive this injection once every 4 weeks or once every 12 weeks. Women will only receive the once every 4 weeks injection. Talk to your pediatrician regarding the use of this medicine in children. Special care may be needed. Overdosage: If you think you have taken too much of this medicine contact a poison control center or emergency room at once. NOTE: This medicine is only for you. Do not share this medicine with others. What if I miss a dose? It is important not to miss your dose. Call your doctor or health care professional if you are unable to  keep an appointment. What may interact with this medicine? -female hormones like estrogen -herbal or dietary supplements like black cohosh, chasteberry, or DHEA -female hormones like testosterone -prasterone This list may not describe all possible interactions. Give your health care provider a list of all the medicines, herbs, non-prescription drugs, or dietary supplements you use. Also tell them if you smoke, drink alcohol, or use illegal drugs. Some items may interact with your medicine. What should I watch for while using this medicine? Visit your doctor or health care professional for regular checks on your progress. Your symptoms may appear to get worse during the first weeks of this therapy. Tell your doctor or healthcare professional if your symptoms do not start to get better or if they get worse after this time. Your bones may get weaker if you take this medicine for a long time. If you smoke or frequently drink alcohol you may increase your risk of bone loss. A family history of osteoporosis, chronic use of drugs for seizures (convulsions), or corticosteroids can also increase your risk of bone loss. Talk to your doctor about how to keep your bones strong. This medicine should stop regular monthly menstration in women. Tell your doctor if you continue to menstrate. Women should not become pregnant while taking this medicine or for 12 weeks after stopping this medicine. Women should inform their doctor if they wish to become pregnant or think they might be pregnant. There is a potential for serious side effects to an unborn child. Talk to your health care professional or pharmacist for more information. Do not breast-feed an infant while taking   this medicine. Men should inform their doctors if they wish to father a child. This medicine may lower sperm counts. Talk to your health care professional or pharmacist for more information. What side effects may I notice from receiving this  medicine? Side effects that you should report to your doctor or health care professional as soon as possible: -allergic reactions like skin rash, itching or hives, swelling of the face, lips, or tongue -bone pain -breathing problems -changes in vision -chest pain -feeling faint or lightheaded, falls -fever, chills -pain, swelling, warmth in the leg -pain, tingling, numbness in the hands or feet -signs and symptoms of low blood pressure like dizziness; feeling faint or lightheaded, falls; unusually weak or tired -stomach pain -swelling of the ankles, feet, hands -trouble passing urine or change in the amount of urine -unusually high or low blood pressure -unusually weak or tired Side effects that usually do not require medical attention (report to your doctor or health care professional if they continue or are bothersome): -change in sex drive or performance -changes in breast size in both males and females -changes in emotions or moods -headache -hot flashes -irritation at site where injected -loss of appetite -skin problems like acne, dry skin -vaginal dryness This list may not describe all possible side effects. Call your doctor for medical advice about side effects. You may report side effects to FDA at 1-800-FDA-1088. Where should I keep my medicine? This drug is given in a hospital or clinic and will not be stored at home. NOTE: This sheet is a summary. It may not cover all possible information. If you have questions about this medicine, talk to your doctor, pharmacist, or health care provider.  2018 Elsevier/Gold Standard (2013-10-15 11:10:35)  

## 2018-01-23 NOTE — Progress Notes (Signed)
Tried to call patient to alert her regarding her high blood sugar today but was unable to reach her.  Will try again tomorrow

## 2018-02-20 ENCOUNTER — Other Ambulatory Visit: Payer: Self-pay | Admitting: Oncology

## 2018-03-02 ENCOUNTER — Telehealth: Payer: Self-pay | Admitting: Oncology

## 2018-03-02 ENCOUNTER — Other Ambulatory Visit: Payer: Self-pay | Admitting: Oncology

## 2018-03-02 NOTE — Telephone Encounter (Signed)
Tried to call regarding change of dates however always busy I did mail letter

## 2018-04-18 ENCOUNTER — Encounter: Payer: Self-pay | Admitting: Adult Health

## 2018-04-18 ENCOUNTER — Inpatient Hospital Stay: Payer: Managed Care, Other (non HMO) | Attending: Oncology

## 2018-04-18 ENCOUNTER — Ambulatory Visit: Payer: Managed Care, Other (non HMO) | Admitting: Oncology

## 2018-04-18 ENCOUNTER — Inpatient Hospital Stay: Payer: Managed Care, Other (non HMO) | Admitting: Adult Health

## 2018-04-18 ENCOUNTER — Inpatient Hospital Stay: Payer: Managed Care, Other (non HMO)

## 2018-04-18 ENCOUNTER — Other Ambulatory Visit: Payer: Managed Care, Other (non HMO)

## 2018-04-18 ENCOUNTER — Ambulatory Visit: Payer: Managed Care, Other (non HMO)

## 2018-04-21 ENCOUNTER — Other Ambulatory Visit: Payer: Self-pay | Admitting: Oncology

## 2018-05-01 ENCOUNTER — Telehealth: Payer: Self-pay

## 2018-05-01 NOTE — Telephone Encounter (Signed)
Per 9/9 patient called to have missed appointments r/s. Called RN to see if the injection was also need with this appointment. And to call me back and it will be added. Per 9/10 phone returns. Mailed a letter with a calender enclosed.

## 2018-05-10 ENCOUNTER — Encounter: Payer: Self-pay | Admitting: Adult Health

## 2018-05-10 ENCOUNTER — Inpatient Hospital Stay: Payer: Managed Care, Other (non HMO) | Attending: Oncology

## 2018-05-10 ENCOUNTER — Inpatient Hospital Stay: Payer: Managed Care, Other (non HMO) | Admitting: Adult Health

## 2018-05-22 ENCOUNTER — Telehealth: Payer: Self-pay

## 2018-05-22 ENCOUNTER — Encounter: Payer: Self-pay | Admitting: Adult Health

## 2018-05-22 ENCOUNTER — Inpatient Hospital Stay: Payer: Managed Care, Other (non HMO)

## 2018-05-22 ENCOUNTER — Inpatient Hospital Stay: Payer: Managed Care, Other (non HMO) | Attending: Oncology

## 2018-05-22 ENCOUNTER — Telehealth: Payer: Self-pay | Admitting: Oncology

## 2018-05-22 ENCOUNTER — Inpatient Hospital Stay (HOSPITAL_BASED_OUTPATIENT_CLINIC_OR_DEPARTMENT_OTHER): Payer: Managed Care, Other (non HMO) | Admitting: Adult Health

## 2018-05-22 VITALS — BP 129/87 | HR 71 | Temp 97.7°F | Resp 18

## 2018-05-22 VITALS — BP 137/103 | HR 83 | Temp 98.3°F | Resp 17 | Ht 67.0 in | Wt 264.4 lb

## 2018-05-22 DIAGNOSIS — Z86718 Personal history of other venous thrombosis and embolism: Secondary | ICD-10-CM | POA: Insufficient documentation

## 2018-05-22 DIAGNOSIS — Z79818 Long term (current) use of other agents affecting estrogen receptors and estrogen levels: Secondary | ICD-10-CM | POA: Insufficient documentation

## 2018-05-22 DIAGNOSIS — Z9011 Acquired absence of right breast and nipple: Secondary | ICD-10-CM

## 2018-05-22 DIAGNOSIS — C50411 Malignant neoplasm of upper-outer quadrant of right female breast: Secondary | ICD-10-CM

## 2018-05-22 DIAGNOSIS — Z923 Personal history of irradiation: Secondary | ICD-10-CM | POA: Diagnosis not present

## 2018-05-22 DIAGNOSIS — Z9221 Personal history of antineoplastic chemotherapy: Secondary | ICD-10-CM

## 2018-05-22 DIAGNOSIS — Z170 Estrogen receptor positive status [ER+]: Secondary | ICD-10-CM | POA: Insufficient documentation

## 2018-05-22 DIAGNOSIS — M7989 Other specified soft tissue disorders: Secondary | ICD-10-CM

## 2018-05-22 LAB — COMPREHENSIVE METABOLIC PANEL
ALT: 19 U/L (ref 0–44)
AST: 19 U/L (ref 15–41)
Albumin: 3.7 g/dL (ref 3.5–5.0)
Alkaline Phosphatase: 105 U/L (ref 38–126)
Anion gap: 10 (ref 5–15)
BILIRUBIN TOTAL: 0.5 mg/dL (ref 0.3–1.2)
BUN: 15 mg/dL (ref 6–20)
CO2: 28 mmol/L (ref 22–32)
Calcium: 9.7 mg/dL (ref 8.9–10.3)
Chloride: 102 mmol/L (ref 98–111)
Creatinine, Ser: 0.82 mg/dL (ref 0.44–1.00)
GFR calc Af Amer: >60 mL/min (ref >60)
Glucose, Bld: 211 mg/dL — ABNORMAL HIGH (ref 70–99)
POTASSIUM: 4 mmol/L (ref 3.5–5.1)
Sodium: 140 mmol/L (ref 135–145)
TOTAL PROTEIN: 7.8 g/dL (ref 6.5–8.1)

## 2018-05-22 LAB — CBC WITH DIFFERENTIAL/PLATELET
BASOS ABS: 0.1 10*3/uL (ref 0.0–0.1)
BASOS PCT: 1 %
Eosinophils Absolute: 0.3 10*3/uL (ref 0.0–0.5)
Eosinophils Relative: 5 %
HEMATOCRIT: 35.7 % (ref 34.8–46.6)
HEMOGLOBIN: 11.3 g/dL — AB (ref 11.6–15.9)
LYMPHS PCT: 22 %
Lymphs Abs: 1.3 10*3/uL (ref 0.9–3.3)
MCH: 24.3 pg — ABNORMAL LOW (ref 25.1–34.0)
MCHC: 31.7 g/dL (ref 31.5–36.0)
MCV: 76.6 fL — AB (ref 79.5–101.0)
Monocytes Absolute: 0.7 10*3/uL (ref 0.1–0.9)
Monocytes Relative: 12 %
NEUTROS ABS: 3.4 10*3/uL (ref 1.5–6.5)
NEUTROS PCT: 60 %
Platelets: 347 10*3/uL (ref 145–400)
RBC: 4.66 MIL/uL (ref 3.70–5.45)
RDW: 13.8 % (ref 11.2–14.5)
WBC: 5.6 10*3/uL (ref 3.9–10.3)

## 2018-05-22 MED ORDER — GOSERELIN ACETATE 10.8 MG ~~LOC~~ IMPL
10.8000 mg | DRUG_IMPLANT | Freq: Once | SUBCUTANEOUS | Status: AC
  Administered 2018-05-22: 10.8 mg via SUBCUTANEOUS
  Filled 2018-05-22: qty 10.8

## 2018-05-22 NOTE — Patient Instructions (Signed)
Bone Health Bones protect organs, store calcium, and anchor muscles. Good health habits, such as eating nutritious foods and exercising regularly, are important for maintaining healthy bones. They can also help to prevent a condition that causes bones to lose density and become weak and brittle (osteoporosis). Why is bone mass important? Bone mass refers to the amount of bone tissue that you have. The higher your bone mass, the stronger your bones. An important step toward having healthy bones throughout life is to have strong and dense bones during childhood. A young adult who has a high bone mass is more likely to have a high bone mass later in life. Bone mass at its greatest it is called peak bone mass. A large decline in bone mass occurs in older adults. In women, it occurs about the time of menopause. During this time, it is important to practice good health habits, because if more bone is lost than what is replaced, the bones will become less healthy and more likely to break (fracture). If you find that you have a low bone mass, you may be able to prevent osteoporosis or further bone loss by changing your diet and lifestyle. How can I find out if my bone mass is low? Bone mass can be measured with an X-ray test that is called a bone mineral density (BMD) test. This test is recommended for all women who are age 65 or older. It may also be recommended for men who are age 70 or older, or for people who are more likely to develop osteoporosis due to:  Having bones that break easily.  Having a long-term disease that weakens bones, such as kidney disease or rheumatoid arthritis.  Having menopause earlier than normal.  Taking medicine that weakens bones, such as steroids, thyroid hormones, or hormone treatment for breast cancer or prostate cancer.  Smoking.  Drinking three or more alcoholic drinks each day.  What are the nutritional recommendations for healthy bones? To have healthy bones, you  need to get enough of the right minerals and vitamins. Most nutrition experts recommend getting these nutrients from the foods that you eat. Nutritional recommendations vary from person to person. Ask your health care provider what is healthy for you. Here are some general guidelines. Calcium Recommendations Calcium is the most important (essential) mineral for bone health. Most people can get enough calcium from their diet, but supplements may be recommended for people who are at risk for osteoporosis. Good sources of calcium include:  Dairy products, such as low-fat or nonfat milk, cheese, and yogurt.  Dark green leafy vegetables, such as bok choy and broccoli.  Calcium-fortified foods, such as orange juice, cereal, bread, soy beverages, and tofu products.  Nuts, such as almonds.  Follow these recommended amounts for daily calcium intake:  Children, age 1?3: 700 mg.  Children, age 4?8: 1,000 mg.  Children, age 9?13: 1,300 mg.  Teens, age 14?18: 1,300 mg.  Adults, age 19?50: 1,000 mg.  Adults, age 51?70: ? Men: 1,000 mg. ? Women: 1,200 mg.  Adults, age 71 or older: 1,200 mg.  Pregnant and breastfeeding females: ? Teens: 1,300 mg. ? Adults: 1,000 mg.  Vitamin D Recommendations Vitamin D is the most essential vitamin for bone health. It helps the body to absorb calcium. Sunlight stimulates the skin to make vitamin D, so be sure to get enough sunlight. If you live in a cold climate or you do not get outside often, your health care provider may recommend that you take vitamin   D supplements. Good sources of vitamin D in your diet include:  Egg yolks.  Saltwater fish.  Milk and cereal fortified with vitamin D.  Follow these recommended amounts for daily vitamin D intake:  Children and teens, age 1?18: 600 international units.  Adults, age 50 or younger: 400-800 international units.  Adults, age 51 or older: 800-1,000 international units.  Other Nutrients Other nutrients  for bone health include:  Phosphorus. This mineral is found in meat, poultry, dairy foods, nuts, and legumes. The recommended daily intake for adult men and adult women is 700 mg.  Magnesium. This mineral is found in seeds, nuts, dark green vegetables, and legumes. The recommended daily intake for adult men is 400?420 mg. For adult women, it is 310?320 mg.  Vitamin K. This vitamin is found in green leafy vegetables. The recommended daily intake is 120 mg for adult men and 90 mg for adult women.  What type of physical activity is best for building and maintaining healthy bones? Weight-bearing and strength-building activities are important for building and maintaining peak bone mass. Weight-bearing activities cause muscles and bones to work against gravity. Strength-building activities increases muscle strength that supports bones. Weight-bearing and muscle-building activities include:  Walking and hiking.  Jogging and running.  Dancing.  Gym exercises.  Lifting weights.  Tennis and racquetball.  Climbing stairs.  Aerobics.  Adults should get at least 30 minutes of moderate physical activity on most days. Children should get at least 60 minutes of moderate physical activity on most days. Ask your health care provide what type of exercise is best for you. Where can I find more information? For more information, check out the following websites:  National Osteoporosis Foundation: http://nof.org/learn/basics  National Institutes of Health: http://www.niams.nih.gov/Health_Info/Bone/Bone_Health/bone_health_for_life.asp  This information is not intended to replace advice given to you by your health care provider. Make sure you discuss any questions you have with your health care provider. Document Released: 10/29/2003 Document Revised: 02/26/2016 Document Reviewed: 08/13/2014 Elsevier Interactive Patient Education  2018 Elsevier Inc.  

## 2018-05-22 NOTE — Telephone Encounter (Signed)
Attempted to cal pt on both numbers listed in chart.  One number a person answered claiming not to be [name]. The other number [phone]) was vm so a message was left asking pt to center back for info on appt.  Waiting for return call.

## 2018-05-22 NOTE — Progress Notes (Signed)
ID: [name]   DOB: [birth year]  MR#: [id number]  QIO#:[id number]  PCP: Hoyt Koch, MD GYN: SU: Autumn Messing, MD [place]:  Gery Pray, MD OTHER MD:  Owens Loffler, MD   CHIEF COMPLAINT:  Right Breast Cancer  CURRENT THERAPY: anastrozole, goserelin  BREAST CANCER HISTORY: From the original intake note:  The patient developed pain in her right breast and brought it to her primary care physician's attention. Mammography at [facility] 03/10/2011 found the breasts to be heterogeneously dense. There was a mildly irregular mass posteriorly in the upper outer quadrant of the right breast. On ultrasound this was hypoechoic and measured approximately 2.4 cm, with irregular margins. The left breast was unremarkable.   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 Bilateral breast MRIs were obtained 03/18/2011, showing in the upper outer quadrant of the right breast an irregular mass measuring 5.5 cm. There was a suggestion of cortical thickening in the level I right axillary lymph node, which measured 1.2 cm. The patient was staged with chest CT scan and PET scan, which showed no distant disease. These studies did confirm the abnormality in the right breast as well as a hypermetabolic right axillary lymph node, making this a clinical T3 N1 or stage III invasive ductal carcinoma.   She completed neoadjuvant chemotherapy and proceeded to surgery and subsequent treatment as detailed below..  INTERVAL HISTORY: [name] returns today for follow-up and treatment of her estrogen receptor positive breast cancer. She continues on anastrozole, with good tolerance.  She has typically received the Goserelin every 3 months. She tolerates those well.  She did miss her injection one month ago, and was rescheduled, but was just able to make it in  today 1 month late due to transportation difficulties.    REVIEW OF SYSTEMS: [name] has some achiness in her joints from time to time.  She also notes cramping and swelling in her right calf.  She has h/o DVT--she says this was 20 years ago and she thinks this DVT was in her left leg. She is up to date with her mammogram.  We do not have record of this. She does not intentionally exercise.  She has not had a pap smear in 6 years.  She notes she is planning on moving in the next few months to [place], [place].    [name] is doing well otherwise.  She denies any unusual headaches or vision changes.  She is without chest pain, palpitations, shortness of breath, or cough.  She denies abdominal concerns, nausea, vomiting, bowel/bladder changes.  She has no new breast issues.  A detailed ROS was otherwise non contributory.    PAST MEDICAL HISTORY: Past Medical History:  Diagnosis Date  . Anemia   . Back pain    lumbar   . Breast cancer ([place]) 2012   right  . Cough 09/07/2012  . Depression    no current med.  . Diabetes mellitus without complication ([place])   . Gastric ulcer   . GERD (gastroesophageal reflux disease)   . H/O scoliosis   . Headache(784.0)    hx. migraine in childhood  . Hypertension    under control with med., has been on med. x 5 yr.  . Peripheral neuropathy    s/p chemotherapy  . S/P radiation therapy 12/19/11 - 02/03/12   Right Chest Wall and Axilla  .  Status post chemotherapy    finished 08/2011    PAST SURGICAL HISTORY: Past Surgical History:  Procedure Laterality Date  . ESOPHAGOGASTRODUODENOSCOPY (EGD) WITH PROPOFOL  09/06/2012   Procedure: ESOPHAGOGASTRODUODENOSCOPY (EGD) WITH PROPOFOL;  Surgeon: Milus Banister, MD;  Location: [facility];  Service: Endoscopy;  Laterality: N/A;  . HIP PINNING  age 39   bilateral  . MASTECTOMY W/ SENTINEL NODE BIOPSY  10/14/2011   Procedure: MASTECTOMY WITH SENTINEL LYMPH NODE BIOPSY;  Surgeon: Merrie Roof, MD;  Location: [facility];   Service: General;  Laterality: Right;  . PORT-A-CATH REMOVAL  09/12/2012   Procedure: REMOVAL PORT-A-CATH;  Surgeon: Merrie Roof, MD;  Location: [facility];  Service: General;  Laterality: Left;  . PORTACATH PLACEMENT  04/08/2011  . WOUND EXPLORATION  10/14/2011   right mastectomy wound    FAMILY HISTORY Family History  Problem Relation Age of Onset  . Heart disease Father        heart attack  . Heart attack Father        died age 7  . Hypertension Father   . Hypertension Mother   . Anemia Mother   . Diabetes Mother   . Stroke Mother   . Aneurysm Mother        brain  . Heart disease Maternal Grandmother        MI  . Hypertension Maternal Grandmother   . Diabetes Maternal Grandmother   . Hypertension Brother   . Diabetes Brother   . Hypertension Paternal Grandmother   . Diabetes Paternal Grandmother   [name] died from a stroke at age 70. The patient's father died from a myocardial infarction at the age of 33. She has one sister and one brother. There is no history of breast or ovarian cancer in the family.   GYNECOLOGIC HISTORY: Menarche age 66. She was premenopausal at the time of diagnosis. She has never used birth control pills. She is GX P2. First pregnancy to term age 68. She had her last period at the time of her surgery, continues on goserelin every 3 months.  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 Her older daughter [name] currently is working 2 jobs. She hopes to become an Chief Financial Officer.. Her other daughter, [name], is currently 67 and will finish high school in 2018. She has not yet decided if she wants to go to college. The patient is separtated from her husband, [name], who works in Architect, but as of December 2017 there still not divorced.Marland Kitchen     ADVANCED DIRECTIVES:at the 08/09/2016 visit the patient was given the appropriate documents to complete and notarize at  her discretion. She does not want her husband to be her healthcare part of attorney but she intends to name her older daughter as healthcare part of attorney. She will get those documents motorized and bring [place] a copy   HEALTH MAINTENANCE: (Updated 06/25/2013) Social History   Tobacco Use  . Smoking status: Former Research scientist (life sciences)  . Smokeless tobacco: Never Used  . Tobacco comment: quit 1998 - states only smoked for 1 month  Substance Use Topics  . Alcohol use: Yes    Comment: rarely  . Drug use: No     Colonoscopy: Never  PAP: Overdue   Bone density: 03/17/2014 t-score 0.0 (normal)  Lipid panel: Dr. Darron Doom    Allergies  Allergen Reactions  . Oxycodone Other (See Comments)  headache  . Tape Other (See Comments)    SKIN TEARS    Current Outpatient Medications  Medication Sig Dispense Refill  . amLODipine (NORVASC) 10 MG tablet Take 1 tablet (10 mg total) by mouth daily. 30 tablet 0  . anastrozole (ARIMIDEX) 1 MG tablet TAKE ONE TABLET BY MOUTH ONCE DAILY 30 tablet 11  . anastrozole (ARIMIDEX) 1 MG tablet Take 1 tablet (1 mg total) by mouth daily. 30 tablet 11  . anastrozole (ARIMIDEX) 1 MG tablet TAKE ONE TABLET BY MOUTH DAILY 30 tablet 10  . B Complex-C (SUPER B COMPLEX PO) Take 1 tablet by mouth daily.    . benazepril-hydrochlorthiazide (LOTENSIN HCT) 20-12.5 MG tablet Take 1 tablet by mouth daily. 30 tablet 0  . benazepril-hydrochlorthiazide (LOTENSIN HCT) 20-12.5 MG tablet TAKE ONE TABLET BY MOUTH DAILY 30 tablet 0  . cyclobenzaprine (FLEXERIL) 10 MG tablet Take 1 tablet (10 mg total) by mouth 2 (two) times daily as needed for muscle spasms. 20 tablet 0  . goserelin (ZOLADEX) 10.8 MG injection Inject 10.8 mg into the skin every 3 (three) months.    . naproxen sodium (ANAPROX) 220 MG tablet Take 220 mg by mouth 2 (two) times daily with a meal.    . omeprazole (PRILOSEC) 20 MG capsule Take 20 mg by mouth daily.    . ranitidine (ZANTAC) 75 MG tablet Take 75 mg by mouth as needed.  ONLY IF OUT OF PROTONIX     No current facility-administered medications for this visit.     OBJECTIVE: Vitals:   05/22/18 0931  BP: (!) 137/103  Pulse: 83  Resp: 17  Temp: 98.3 F (36.8 C)  SpO2: 100%     Body mass index is 41.41 kg/m.    ECOG FS: 0 Filed Weights   05/22/18 0931  Weight: 264 lb 6.4 oz (119.9 kg)   GENERAL: Patient is a well appearing female in no acute distress HEENT:  Sclerae anicteric.  Oropharynx clear and moist. No ulcerations or evidence of oropharyngeal candidiasis. Neck is supple.  NODES:  No cervical, supraclavicular, or axillary lymphadenopathy palpated.  BREAST EXAM:  Right breast s/p mastectomy and radiation, no sign of recurrence, left breast benign (very large so exam was slightly difficult) LUNGS:  Clear to auscultation bilaterally.  No wheezes or rhonchi. HEART:  Regular rate and rhythm. No murmur appreciated. ABDOMEN:  Soft, nontender.  Positive, normoactive bowel sounds. No organomegaly palpated. MSK:  No focal spinal tenderness to palpation. Full range of motion bilaterally in the upper extremities. EXTREMITIES:  No peripheral edema.   SKIN:  Clear with no obvious rashes or skin changes. No nail dyscrasia. NEURO:  Nonfocal. Well oriented.  Appropriate affect.   LAB RESULTS: Lab Results  Component Value Date   WBC 5.6 05/22/2018   NEUTROABS 3.4 05/22/2018   HGB 11.3 (L) 05/22/2018   HCT 35.7 05/22/2018   MCV 76.6 (L) 05/22/2018   PLT 347 05/22/2018      Chemistry      Component Value Date/Time   NA 137 01/23/2018 1343   NA 143 07/19/2017 1559   K 3.6 01/23/2018 1343   K 3.8 07/19/2017 1559   CL 100 01/23/2018 1343   CL 101 12/21/2012 1019   CO2 27 01/23/2018 1343   CO2 28 07/19/2017 1559   BUN 15 01/23/2018 1343   BUN 9.1 07/19/2017 1559   CREATININE 1.08 01/23/2018 1343   CREATININE 0.8 07/19/2017 1559      Component Value Date/Time   CALCIUM 10.0  01/23/2018 1343   CALCIUM 9.5 07/19/2017 1559   ALKPHOS 146 01/23/2018  1343   ALKPHOS 123 07/19/2017 1559   AST 16 01/23/2018 1343   AST 19 07/19/2017 1559   ALT 15 01/23/2018 1343   ALT 17 07/19/2017 1559   BILITOT 0.2 01/23/2018 1343   BILITOT 0.39 07/19/2017 1559       STUDIES: Mammography at [facility]: 42 y.o.  BRCA 1-2 negative [place] woman   (1) status post right breast upper outer quadrant biopsy July of 2012 for a clinical T3 N1 (stage III) invasive ductal carcinoma, grade 3, 99% estrogen and  100% progesterone receptor positive, HER-2 negative, with an MIB-1 of 98%.   (2) Status post 4 cycles of dose dense doxorubicin and cyclophosphamide, followed by weekly paclitaxel x 8, completed 08/25/2011  (3) s/p right mastectomy and sentinel lymph node sampling 10/14/2011 for a residual ypT2 ypN1(mic) invasive ductal carcinoma, grade 3, with ample margins  (4) status post radiation therapy under the care of Dr. Sondra Come,  completed in mid June.  (5)  Anti-estrogen therapy started 01/18/2012 with first Zoladex injection, continued every 3 months. Anastrozole started in late June 2013, the goal being to continue for at least 7 years (June 2020).  (a) [facility] density at [facility] 08/17/16 shows a T score of -2.3 in the AP spine  (6) remote history of DVT  PLAN:   Giavana is doing well today.  She is without any clinical or radiographic sign of recurrence.  She will continue on Goserelin and Anastrozole.  I reviewed my concern with her missing a couple of appointments and her Goserelin being delayed by one month. I reviewed that this is concerning and that at this time her Anastrozole cannot work when her ovaries are producing estrogens.  She declines any assistance from our social workers to help with any obstacles that may be interfering with her coming to her appointments.  I told her to call us if she changes her mind about that.  We will test [facility] and estradiol intermittently beginning with her next injection appointment.    Due to her new right leg  swelling I ordered a doppler of her right leg.    [name] underwent genetic testing in 2013.  After review with Santiago Glad, she is eligible for retesting, since she just had BRCA testing done.  We will set that up.    [name] will return every three months for her Goserelin and will have a lab with every other injection date.  We reviewed bone health, diet, exercise, and recommendations for health maintenance.  I referred her to GYN today and highly recommended she get caught up with her pap smear ASAP.  I reviewed risks of not doing so with her.    She knows to call for any problems that may develop before her return visit here, which will be next September.   A total of (30) minutes of face-to-face time was spent with this patient with greater than 50% of that time in counseling and care-coordination.  Scot Dock, NP     05/22/2018

## 2018-05-22 NOTE — Telephone Encounter (Signed)
Gave avs and calendar ° °

## 2018-05-23 ENCOUNTER — Encounter: Payer: Self-pay | Admitting: Adult Health

## 2018-05-23 ENCOUNTER — Telehealth: Payer: Self-pay

## 2018-05-23 ENCOUNTER — Ambulatory Visit (HOSPITAL_COMMUNITY): Admission: RE | Admit: 2018-05-23 | Payer: Managed Care, Other (non HMO) | Source: Ambulatory Visit

## 2018-05-23 NOTE — Telephone Encounter (Signed)
-----   Message from Gardenia Phlegm, NP sent at 05/23/2018 12:58 PM EDT ----- Regarding: FW: No Show   ----- Message ----- From: Asencion Gowda Sent: 05/23/2018  10:19 AM EDT To: Gardenia Phlegm, NP Subject: No Show                                        The above patient was a No show for their scheduled Vascular appointment this morning, we just scheduled this appointment yesterday for the patient. I called the number listed but was told it was the wrong number.

## 2018-05-23 NOTE — Telephone Encounter (Signed)
Unable to reach pt on numbers listed in chart about missed doppler appt this am.  Letter has been sent to address listed in chart.

## 2018-05-24 ENCOUNTER — Telehealth: Payer: Self-pay | Admitting: Obstetrics & Gynecology

## 2018-05-24 NOTE — Telephone Encounter (Signed)
Called and left a message at work number for patient to call back to schedule a new patient "urgent" doctor referral appointment with our office to see Dr. [name] Heck for: Hasn't had pap in 6 years, needs one before moving out of town in 2-3 months.   When I called the patient's mobile number a person answered and said, "There is no [name] at this number."

## 2018-05-25 NOTE — Telephone Encounter (Signed)
When I called the patient's work number a person answered and said, "There is no Lorra Hals at this number."  Routing referral back to referring office since contact numbers are invalid.

## 2018-06-06 ENCOUNTER — Other Ambulatory Visit: Payer: Self-pay | Admitting: Family Medicine

## 2018-06-06 DIAGNOSIS — M79661 Pain in right lower leg: Secondary | ICD-10-CM

## 2018-06-11 ENCOUNTER — Ambulatory Visit
Admission: RE | Admit: 2018-06-11 | Discharge: 2018-06-11 | Disposition: A | Discharge status: Home / Self Care | Payer: Managed Care, Other (non HMO) | Source: Ambulatory Visit | Attending: Family Medicine | Admitting: Family Medicine

## 2018-06-11 DIAGNOSIS — M79661 Pain in right lower leg: Secondary | ICD-10-CM

## 2018-08-14 ENCOUNTER — Inpatient Hospital Stay: Payer: Managed Care, Other (non HMO) | Attending: Oncology

## 2018-08-14 ENCOUNTER — Inpatient Hospital Stay: Payer: Managed Care, Other (non HMO)

## 2018-08-21 ENCOUNTER — Telehealth: Payer: Self-pay | Admitting: Oncology

## 2018-08-21 NOTE — Telephone Encounter (Signed)
Called patient per 12/30 sch message -  Called [phone] - no answer - unable to reach patient - no vmail set up

## 2018-08-29 ENCOUNTER — Telehealth: Payer: Self-pay | Admitting: Oncology

## 2018-08-29 NOTE — Telephone Encounter (Signed)
Called patient per 12/31 voicemail log, rescheduled miss lab and injection appointment for patient.  Patient aware of appointment.

## 2018-08-30 ENCOUNTER — Inpatient Hospital Stay: Payer: Managed Care, Other (non HMO) | Attending: Oncology

## 2018-08-30 ENCOUNTER — Telehealth: Payer: Self-pay | Admitting: Oncology

## 2018-08-30 ENCOUNTER — Inpatient Hospital Stay: Payer: Managed Care, Other (non HMO)

## 2018-08-30 VITALS — BP 144/92 | HR 86 | Temp 98.5°F | Resp 18

## 2018-08-30 DIAGNOSIS — Z923 Personal history of irradiation: Secondary | ICD-10-CM | POA: Insufficient documentation

## 2018-08-30 DIAGNOSIS — Z9011 Acquired absence of right breast and nipple: Secondary | ICD-10-CM | POA: Diagnosis not present

## 2018-08-30 DIAGNOSIS — C50411 Malignant neoplasm of upper-outer quadrant of right female breast: Secondary | ICD-10-CM | POA: Insufficient documentation

## 2018-08-30 DIAGNOSIS — Z9221 Personal history of antineoplastic chemotherapy: Secondary | ICD-10-CM | POA: Insufficient documentation

## 2018-08-30 DIAGNOSIS — Z79818 Long term (current) use of other agents affecting estrogen receptors and estrogen levels: Secondary | ICD-10-CM | POA: Diagnosis not present

## 2018-08-30 DIAGNOSIS — Z170 Estrogen receptor positive status [ER+]: Secondary | ICD-10-CM | POA: Diagnosis not present

## 2018-08-30 DIAGNOSIS — Z86718 Personal history of other venous thrombosis and embolism: Secondary | ICD-10-CM | POA: Insufficient documentation

## 2018-08-30 MED ORDER — GOSERELIN ACETATE 10.8 MG ~~LOC~~ IMPL
10.8000 mg | DRUG_IMPLANT | Freq: Once | SUBCUTANEOUS | Status: AC
  Administered 2018-08-30: 10.8 mg via SUBCUTANEOUS
  Filled 2018-08-30: qty 10.8

## 2018-08-30 MED ORDER — FULVESTRANT 250 MG/5ML IM SOLN
INTRAMUSCULAR | Status: AC
  Filled 2018-08-30: qty 5

## 2018-08-30 MED ORDER — GOSERELIN ACETATE 3.6 MG ~~LOC~~ IMPL
DRUG_IMPLANT | SUBCUTANEOUS | Status: AC
  Filled 2018-08-30: qty 3.6

## 2018-08-30 NOTE — Telephone Encounter (Signed)
Talk with nurse she said no need to reschedule the lab that was missed on 1/9

## 2018-08-30 NOTE — Patient Instructions (Addendum)
Goserelin injection What is this medicine? GOSERELIN (GOE se rel in) is similar to a hormone found in the body. It lowers the amount of sex hormones that the body makes. Men will have lower testosterone levels and women will have lower estrogen levels while taking this medicine. In men, this medicine is used to treat prostate cancer; the injection is either given once per month or once every 12 weeks. A once per month injection (only) is used to treat women with endometriosis, dysfunctional uterine bleeding, or advanced breast cancer. This medicine may be used for other purposes; ask your health care provider or pharmacist if you have questions. COMMON BRAND NAME(S): Zoladex What should I tell my health care provider before I take this medicine? They need to know if you have any of these conditions (some only apply to women): -diabetes -heart disease or previous heart attack -high blood pressure -high cholesterol -kidney disease -osteoporosis or low bone density -problems passing urine -spinal cord injury -stroke -tobacco smoker -an unusual or allergic reaction to goserelin, hormone therapy, other medicines, foods, dyes, or preservatives -pregnant or trying to get pregnant -breast-feeding How should I use this medicine? This medicine is for injection under the skin. It is given by a health care professional in a hospital or clinic setting. Men receive this injection once every 4 weeks or once every 12 weeks. Women will only receive the once every 4 weeks injection. Talk to your pediatrician regarding the use of this medicine in children. Special care may be needed. Overdosage: If you think you have taken too much of this medicine contact a poison control center or emergency room at once. NOTE: This medicine is only for you. Do not share this medicine with others. What if I miss a dose? It is important not to miss your dose. Call your doctor or health care professional if you are unable to  keep an appointment. What may interact with this medicine? -female hormones like estrogen -herbal or dietary supplements like black cohosh, chasteberry, or DHEA -female hormones like testosterone -prasterone This list may not describe all possible interactions. Give your health care provider a list of all the medicines, herbs, non-prescription drugs, or dietary supplements you use. Also tell them if you smoke, drink alcohol, or use illegal drugs. Some items may interact with your medicine. What should I watch for while using this medicine? Visit your doctor or health care professional for regular checks on your progress. Your symptoms may appear to get worse during the first weeks of this therapy. Tell your doctor or healthcare professional if your symptoms do not start to get better or if they get worse after this time. Your bones may get weaker if you take this medicine for a long time. If you smoke or frequently drink alcohol you may increase your risk of bone loss. A family history of osteoporosis, chronic use of drugs for seizures (convulsions), or corticosteroids can also increase your risk of bone loss. Talk to your doctor about how to keep your bones strong. This medicine should stop regular monthly menstration in women. Tell your doctor if you continue to menstrate. Women should not become pregnant while taking this medicine or for 12 weeks after stopping this medicine. Women should inform their doctor if they wish to become pregnant or think they might be pregnant. There is a potential for serious side effects to an unborn child. Talk to your health care professional or pharmacist for more information. Do not breast-feed an infant while taking   this medicine. Men should inform their doctors if they wish to father a child. This medicine may lower sperm counts. Talk to your health care professional or pharmacist for more information. What side effects may I notice from receiving this  medicine? Side effects that you should report to your doctor or health care professional as soon as possible: -allergic reactions like skin rash, itching or hives, swelling of the face, lips, or tongue -bone pain -breathing problems -changes in vision -chest pain -feeling faint or lightheaded, falls -fever, chills -pain, swelling, warmth in the leg -pain, tingling, numbness in the hands or feet -signs and symptoms of low blood pressure like dizziness; feeling faint or lightheaded, falls; unusually weak or tired -stomach pain -swelling of the ankles, feet, hands -trouble passing urine or change in the amount of urine -unusually high or low blood pressure -unusually weak or tired Side effects that usually do not require medical attention (report to your doctor or health care professional if they continue or are bothersome): -change in sex drive or performance -changes in breast size in both males and females -changes in emotions or moods -headache -hot flashes -irritation at site where injected -loss of appetite -skin problems like acne, dry skin -vaginal dryness This list may not describe all possible side effects. Call your doctor for medical advice about side effects. You may report side effects to FDA at 1-800-FDA-1088. Where should I keep my medicine? This drug is given in a hospital or clinic and will not be stored at home. NOTE: This sheet is a summary. It may not cover all possible information. If you have questions about this medicine, talk to your doctor, pharmacist, or health care provider.  2019 Elsevier/Gold Standard (2013-10-15 11:10:35)  

## 2018-09-25 ENCOUNTER — Ambulatory Visit
Admission: RE | Admit: 2018-09-25 | Discharge: 2018-09-25 | Disposition: A | Discharge status: Home / Self Care | Payer: Managed Care, Other (non HMO) | Source: Ambulatory Visit | Attending: Family Medicine | Admitting: Family Medicine

## 2018-09-25 ENCOUNTER — Other Ambulatory Visit: Payer: Self-pay | Admitting: Family Medicine

## 2018-09-25 DIAGNOSIS — R269 Unspecified abnormalities of gait and mobility: Secondary | ICD-10-CM

## 2018-09-25 DIAGNOSIS — M25562 Pain in left knee: Secondary | ICD-10-CM

## 2018-11-06 ENCOUNTER — Inpatient Hospital Stay: Payer: Managed Care, Other (non HMO)

## 2018-11-20 ENCOUNTER — Other Ambulatory Visit: Payer: Self-pay | Admitting: Oncology

## 2018-11-21 ENCOUNTER — Other Ambulatory Visit: Payer: Self-pay

## 2018-11-21 DIAGNOSIS — C50411 Malignant neoplasm of upper-outer quadrant of right female breast: Secondary | ICD-10-CM

## 2018-11-21 DIAGNOSIS — Z170 Estrogen receptor positive status [ER+]: Principal | ICD-10-CM

## 2018-11-22 ENCOUNTER — Inpatient Hospital Stay: Payer: Managed Care, Other (non HMO) | Attending: Oncology

## 2018-11-22 ENCOUNTER — Telehealth: Payer: Self-pay | Admitting: Oncology

## 2018-11-22 ENCOUNTER — Inpatient Hospital Stay: Payer: Managed Care, Other (non HMO)

## 2018-11-22 DIAGNOSIS — Z170 Estrogen receptor positive status [ER+]: Secondary | ICD-10-CM | POA: Insufficient documentation

## 2018-11-22 DIAGNOSIS — Z5111 Encounter for antineoplastic chemotherapy: Secondary | ICD-10-CM | POA: Insufficient documentation

## 2018-11-22 DIAGNOSIS — C50411 Malignant neoplasm of upper-outer quadrant of right female breast: Secondary | ICD-10-CM | POA: Insufficient documentation

## 2018-11-22 NOTE — Telephone Encounter (Signed)
Called patient per 4/1 sch message to reschedule appt - unable to reach patient . Left message for patient to call back and to reschedule.

## 2018-12-06 ENCOUNTER — Inpatient Hospital Stay: Payer: Managed Care, Other (non HMO)

## 2018-12-06 ENCOUNTER — Other Ambulatory Visit: Payer: Self-pay

## 2018-12-06 VITALS — BP 165/91 | HR 94 | Temp 99.3°F | Resp 19

## 2018-12-06 DIAGNOSIS — C50411 Malignant neoplasm of upper-outer quadrant of right female breast: Secondary | ICD-10-CM

## 2018-12-06 DIAGNOSIS — Z170 Estrogen receptor positive status [ER+]: Principal | ICD-10-CM

## 2018-12-06 LAB — CMP (CANCER CENTER ONLY)
ALT: 19 U/L (ref 0–44)
AST: 24 U/L (ref 15–41)
Albumin: 3.7 g/dL (ref 3.5–5.0)
Alkaline Phosphatase: 124 U/L (ref 38–126)
Anion gap: 13 (ref 5–15)
BUN: 11 mg/dL (ref 6–20)
CO2: 25 mmol/L (ref 22–32)
Calcium: 9.7 mg/dL (ref 8.9–10.3)
Chloride: 101 mmol/L (ref 98–111)
Creatinine: 0.85 mg/dL (ref 0.44–1.00)
GFR, Est AFR Am: >60 mL/min (ref >60)
GFR, Estimated: >60 mL/min (ref >60)
Glucose, Bld: 262 mg/dL — ABNORMAL HIGH (ref 70–99)
Potassium: 3.6 mmol/L (ref 3.5–5.1)
Sodium: 139 mmol/L (ref 135–145)
Total Bilirubin: 0.6 mg/dL (ref 0.3–1.2)
Total Protein: 7.8 g/dL (ref 6.5–8.1)

## 2018-12-06 LAB — CBC WITH DIFFERENTIAL (CANCER CENTER ONLY)
Abs Immature Granulocytes: 0.02 10*3/uL (ref 0.00–0.07)
Basophils Absolute: 0 10*3/uL (ref 0.0–0.1)
Basophils Relative: 1 %
Eosinophils Absolute: 0.3 10*3/uL (ref 0.0–0.5)
Eosinophils Relative: 5 %
HCT: 38.3 % (ref 36.0–46.0)
Hemoglobin: 11.6 g/dL — ABNORMAL LOW (ref 12.0–15.0)
Immature Granulocytes: 0 %
Lymphocytes Relative: 29 %
Lymphs Abs: 1.8 10*3/uL (ref 0.7–4.0)
MCH: 24 pg — ABNORMAL LOW (ref 26.0–34.0)
MCHC: 30.3 g/dL (ref 30.0–36.0)
MCV: 79.3 fL — ABNORMAL LOW (ref 80.0–100.0)
Monocytes Absolute: 0.5 10*3/uL (ref 0.1–1.0)
Monocytes Relative: 8 %
Neutro Abs: 3.5 10*3/uL (ref 1.7–7.7)
Neutrophils Relative %: 57 %
Platelet Count: 361 10*3/uL (ref 150–400)
RBC: 4.83 MIL/uL (ref 3.87–5.11)
RDW: 13.9 % (ref 11.5–15.5)
WBC Count: 6.1 10*3/uL (ref 4.0–10.5)
nRBC: 0 % (ref 0.0–0.2)

## 2018-12-06 MED ORDER — GOSERELIN ACETATE 10.8 MG ~~LOC~~ IMPL
10.8000 mg | DRUG_IMPLANT | Freq: Once | SUBCUTANEOUS | Status: AC
  Administered 2018-12-06: 10.8 mg via SUBCUTANEOUS
  Filled 2018-12-06: qty 10.8

## 2018-12-06 NOTE — Patient Instructions (Signed)
Goserelin injection What is this medicine? GOSERELIN (GOE se rel in) is similar to a hormone found in the body. It lowers the amount of sex hormones that the body makes. Men will have lower testosterone levels and women will have lower estrogen levels while taking this medicine. In men, this medicine is used to treat prostate cancer; the injection is either given once per month or once every 12 weeks. A once per month injection (only) is used to treat women with endometriosis, dysfunctional uterine bleeding, or advanced breast cancer. This medicine may be used for other purposes; ask your health care provider or pharmacist if you have questions. COMMON BRAND NAME(S): Zoladex What should I tell my health care provider before I take this medicine? They need to know if you have any of these conditions (some only apply to women): -diabetes -heart disease or previous heart attack -high blood pressure -high cholesterol -kidney disease -osteoporosis or low bone density -problems passing urine -spinal cord injury -stroke -tobacco smoker -an unusual or allergic reaction to goserelin, hormone therapy, other medicines, foods, dyes, or preservatives -pregnant or trying to get pregnant -breast-feeding How should I use this medicine? This medicine is for injection under the skin. It is given by a health care professional in a hospital or clinic setting. Men receive this injection once every 4 weeks or once every 12 weeks. Women will only receive the once every 4 weeks injection. Talk to your pediatrician regarding the use of this medicine in children. Special care may be needed. Overdosage: If you think you have taken too much of this medicine contact a poison control center or emergency room at once. NOTE: This medicine is only for you. Do not share this medicine with others. What if I miss a dose? It is important not to miss your dose. Call your doctor or health care professional if you are unable to  keep an appointment. What may interact with this medicine? -female hormones like estrogen -herbal or dietary supplements like black cohosh, chasteberry, or DHEA -female hormones like testosterone -prasterone This list may not describe all possible interactions. Give your health care provider a list of all the medicines, herbs, non-prescription drugs, or dietary supplements you use. Also tell them if you smoke, drink alcohol, or use illegal drugs. Some items may interact with your medicine. What should I watch for while using this medicine? Visit your doctor or health care professional for regular checks on your progress. Your symptoms may appear to get worse during the first weeks of this therapy. Tell your doctor or healthcare professional if your symptoms do not start to get better or if they get worse after this time. Your bones may get weaker if you take this medicine for a long time. If you smoke or frequently drink alcohol you may increase your risk of bone loss. A family history of osteoporosis, chronic use of drugs for seizures (convulsions), or corticosteroids can also increase your risk of bone loss. Talk to your doctor about how to keep your bones strong. This medicine should stop regular monthly menstration in women. Tell your doctor if you continue to menstrate. Women should not become pregnant while taking this medicine or for 12 weeks after stopping this medicine. Women should inform their doctor if they wish to become pregnant or think they might be pregnant. There is a potential for serious side effects to an unborn child. Talk to your health care professional or pharmacist for more information. Do not breast-feed an infant while taking   this medicine. Men should inform their doctors if they wish to father a child. This medicine may lower sperm counts. Talk to your health care professional or pharmacist for more information. What side effects may I notice from receiving this  medicine? Side effects that you should report to your doctor or health care professional as soon as possible: -allergic reactions like skin rash, itching or hives, swelling of the face, lips, or tongue -bone pain -breathing problems -changes in vision -chest pain -feeling faint or lightheaded, falls -fever, chills -pain, swelling, warmth in the leg -pain, tingling, numbness in the hands or feet -signs and symptoms of low blood pressure like dizziness; feeling faint or lightheaded, falls; unusually weak or tired -stomach pain -swelling of the ankles, feet, hands -trouble passing urine or change in the amount of urine -unusually high or low blood pressure -unusually weak or tired Side effects that usually do not require medical attention (report to your doctor or health care professional if they continue or are bothersome): -change in sex drive or performance -changes in breast size in both males and females -changes in emotions or moods -headache -hot flashes -irritation at site where injected -loss of appetite -skin problems like acne, dry skin -vaginal dryness This list may not describe all possible side effects. Call your doctor for medical advice about side effects. You may report side effects to FDA at 1-800-FDA-1088. Where should I keep my medicine? This drug is given in a hospital or clinic and will not be stored at home. NOTE: This sheet is a summary. It may not cover all possible information. If you have questions about this medicine, talk to your doctor, pharmacist, or health care provider.  2019 Elsevier/Gold Standard (2013-10-15 11:10:35)  

## 2018-12-07 ENCOUNTER — Telehealth: Payer: Self-pay | Admitting: Oncology

## 2018-12-07 LAB — FOLLICLE STIMULATING HORMONE: FSH: 4.3 m[IU]/mL

## 2018-12-07 NOTE — Telephone Encounter (Signed)
Called patient regarding cancellation and upcoming appointments, patient is aware and a calender will be sent.

## 2018-12-11 LAB — ESTRADIOL, ULTRA SENS: Estradiol, Sensitive: <2.5 pg/mL

## 2018-12-17 ENCOUNTER — Other Ambulatory Visit: Payer: Self-pay | Admitting: *Deleted

## 2018-12-17 MED ORDER — ANASTROZOLE 1 MG PO TABS: ORAL_TABLET | ORAL | 11 refills | Status: DC

## 2018-12-17 MED FILL — ANASTROZOLE 1 MG TABLET: 1 | 30 days supply | Qty: 30 | Fill #0

## 2019-01-17 MED FILL — ANASTROZOLE 1 MG TABLET: 1 | 30 days supply | Qty: 30 | Fill #1

## 2019-01-29 ENCOUNTER — Other Ambulatory Visit: Payer: Managed Care, Other (non HMO)

## 2019-01-29 ENCOUNTER — Ambulatory Visit: Payer: Managed Care, Other (non HMO)

## 2019-02-14 ENCOUNTER — Ambulatory Visit: Payer: Managed Care, Other (non HMO)

## 2019-02-14 ENCOUNTER — Other Ambulatory Visit: Payer: Managed Care, Other (non HMO)

## 2019-02-19 MED FILL — ANASTROZOLE 1 MG TABLET: 1 | 30 days supply | Qty: 30 | Fill #2

## 2019-02-27 ENCOUNTER — Other Ambulatory Visit: Payer: Self-pay

## 2019-02-27 DIAGNOSIS — C50411 Malignant neoplasm of upper-outer quadrant of right female breast: Secondary | ICD-10-CM

## 2019-02-27 DIAGNOSIS — Z170 Estrogen receptor positive status [ER+]: Secondary | ICD-10-CM

## 2019-02-28 ENCOUNTER — Inpatient Hospital Stay: Payer: Medicaid Other

## 2019-02-28 ENCOUNTER — Inpatient Hospital Stay: Payer: Medicaid Other | Attending: Oncology

## 2019-02-28 ENCOUNTER — Other Ambulatory Visit: Payer: Self-pay

## 2019-02-28 VITALS — BP 154/90 | HR 82 | Temp 98.5°F | Resp 18

## 2019-02-28 DIAGNOSIS — C50411 Malignant neoplasm of upper-outer quadrant of right female breast: Secondary | ICD-10-CM

## 2019-02-28 DIAGNOSIS — Z170 Estrogen receptor positive status [ER+]: Secondary | ICD-10-CM | POA: Insufficient documentation

## 2019-02-28 DIAGNOSIS — Z79899 Other long term (current) drug therapy: Secondary | ICD-10-CM | POA: Insufficient documentation

## 2019-02-28 LAB — CBC WITH DIFFERENTIAL (CANCER CENTER ONLY)
Abs Immature Granulocytes: 0.02 10*3/uL (ref 0.00–0.07)
Basophils Absolute: 0 10*3/uL (ref 0.0–0.1)
Basophils Relative: 0 %
Eosinophils Absolute: 0.3 10*3/uL (ref 0.0–0.5)
Eosinophils Relative: 4 %
HCT: 35.1 % — ABNORMAL LOW (ref 36.0–46.0)
Hemoglobin: 10.9 g/dL — ABNORMAL LOW (ref 12.0–15.0)
Immature Granulocytes: 0 %
Lymphocytes Relative: 25 %
Lymphs Abs: 1.7 10*3/uL (ref 0.7–4.0)
MCH: 24.2 pg — ABNORMAL LOW (ref 26.0–34.0)
MCHC: 31.1 g/dL (ref 30.0–36.0)
MCV: 78 fL — ABNORMAL LOW (ref 80.0–100.0)
Monocytes Absolute: 0.7 10*3/uL (ref 0.1–1.0)
Monocytes Relative: 10 %
Neutro Abs: 4.1 10*3/uL (ref 1.7–7.7)
Neutrophils Relative %: 61 %
Platelet Count: 327 10*3/uL (ref 150–400)
RBC: 4.5 MIL/uL (ref 3.87–5.11)
RDW: 13.7 % (ref 11.5–15.5)
WBC Count: 6.8 10*3/uL (ref 4.0–10.5)
nRBC: 0 % (ref 0.0–0.2)

## 2019-02-28 LAB — CMP (CANCER CENTER ONLY)
ALT: 25 U/L (ref 0–44)
AST: 23 U/L (ref 15–41)
Albumin: 3.7 g/dL (ref 3.5–5.0)
Alkaline Phosphatase: 136 U/L — ABNORMAL HIGH (ref 38–126)
Anion gap: 11 (ref 5–15)
BUN: 14 mg/dL (ref 6–20)
CO2: 28 mmol/L (ref 22–32)
Calcium: 9.6 mg/dL (ref 8.9–10.3)
Chloride: 97 mmol/L — ABNORMAL LOW (ref 98–111)
Creatinine: 0.98 mg/dL (ref 0.44–1.00)
GFR, Est AFR Am: >60 mL/min (ref >60)
GFR, Estimated: >60 mL/min (ref >60)
Glucose, Bld: 380 mg/dL — ABNORMAL HIGH (ref 70–99)
Potassium: 3.7 mmol/L (ref 3.5–5.1)
Sodium: 136 mmol/L (ref 135–145)
Total Bilirubin: 0.3 mg/dL (ref 0.3–1.2)
Total Protein: 7.8 g/dL (ref 6.5–8.1)

## 2019-02-28 MED ORDER — GOSERELIN ACETATE 10.8 MG ~~LOC~~ IMPL
10.8000 mg | DRUG_IMPLANT | Freq: Once | SUBCUTANEOUS | Status: AC
  Administered 2019-02-28: 10.8 mg via SUBCUTANEOUS
  Filled 2019-02-28: qty 10.8

## 2019-03-01 MED FILL — ANASTROZOLE 1 MG TABLET: 1 | 30 days supply | Qty: 30 | Fill #2

## 2019-03-20 ENCOUNTER — Other Ambulatory Visit: Payer: Self-pay

## 2019-03-20 ENCOUNTER — Emergency Department (HOSPITAL_COMMUNITY): Payer: Self-pay

## 2019-03-20 ENCOUNTER — Encounter (HOSPITAL_COMMUNITY): Payer: Self-pay | Admitting: Emergency Medicine

## 2019-03-20 DIAGNOSIS — Z79899 Other long term (current) drug therapy: Secondary | ICD-10-CM | POA: Insufficient documentation

## 2019-03-20 DIAGNOSIS — Z87891 Personal history of nicotine dependence: Secondary | ICD-10-CM | POA: Insufficient documentation

## 2019-03-20 DIAGNOSIS — R0789 Other chest pain: Secondary | ICD-10-CM | POA: Insufficient documentation

## 2019-03-20 DIAGNOSIS — E119 Type 2 diabetes mellitus without complications: Secondary | ICD-10-CM | POA: Insufficient documentation

## 2019-03-20 DIAGNOSIS — I10 Essential (primary) hypertension: Secondary | ICD-10-CM | POA: Insufficient documentation

## 2019-03-20 DIAGNOSIS — Z20828 Contact with and (suspected) exposure to other viral communicable diseases: Secondary | ICD-10-CM | POA: Insufficient documentation

## 2019-03-20 DIAGNOSIS — Z853 Personal history of malignant neoplasm of breast: Secondary | ICD-10-CM | POA: Insufficient documentation

## 2019-03-20 DIAGNOSIS — R0602 Shortness of breath: Secondary | ICD-10-CM | POA: Insufficient documentation

## 2019-03-20 LAB — CBC
HCT: 38.2 % (ref 36.0–46.0)
Hemoglobin: 11.5 g/dL — ABNORMAL LOW (ref 12.0–15.0)
MCH: 24.4 pg — ABNORMAL LOW (ref 26.0–34.0)
MCHC: 30.1 g/dL (ref 30.0–36.0)
MCV: 81.1 fL (ref 80.0–100.0)
Platelets: 402 10*3/uL — ABNORMAL HIGH (ref 150–400)
RBC: 4.71 MIL/uL (ref 3.87–5.11)
RDW: 14.3 % (ref 11.5–15.5)
WBC: 6.5 10*3/uL (ref 4.0–10.5)
nRBC: 0 % (ref 0.0–0.2)

## 2019-03-20 LAB — I-STAT BETA HCG BLOOD, ED (MC, WL, AP ONLY): I-stat hCG, quantitative: <5 m[IU]/mL (ref <5)

## 2019-03-20 LAB — BASIC METABOLIC PANEL
Anion gap: 11 (ref 5–15)
BUN: 11 mg/dL (ref 6–20)
CO2: 28 mmol/L (ref 22–32)
Calcium: 9.1 mg/dL (ref 8.9–10.3)
Chloride: 100 mmol/L (ref 98–111)
Creatinine, Ser: 0.74 mg/dL (ref 0.44–1.00)
GFR calc Af Amer: >60 mL/min (ref >60)
GFR calc non Af Amer: >60 mL/min (ref >60)
Glucose, Bld: 359 mg/dL — ABNORMAL HIGH (ref 70–99)
Potassium: 3.6 mmol/L (ref 3.5–5.1)
Sodium: 139 mmol/L (ref 135–145)

## 2019-03-20 LAB — TROPONIN I (HIGH SENSITIVITY)
Troponin I (High Sensitivity): 4 ng/L (ref <18)
Troponin I (High Sensitivity): 4 ng/L (ref <18)

## 2019-03-20 NOTE — ED Triage Notes (Signed)
Pt c/o left sided chest pains with SOB for 3 days.

## 2019-03-21 ENCOUNTER — Emergency Department (HOSPITAL_COMMUNITY)
Admission: EM | Admit: 2019-03-21 | Discharge: 2019-03-21 | Disposition: A | Discharge status: Home / Self Care | Payer: Self-pay | Attending: Emergency Medicine | Admitting: Emergency Medicine

## 2019-03-21 DIAGNOSIS — R0602 Shortness of breath: Secondary | ICD-10-CM

## 2019-03-21 DIAGNOSIS — R05 Cough: Secondary | ICD-10-CM

## 2019-03-21 DIAGNOSIS — R079 Chest pain, unspecified: Secondary | ICD-10-CM

## 2019-03-21 DIAGNOSIS — R059 Cough, unspecified: Secondary | ICD-10-CM

## 2019-03-21 LAB — D-DIMER, QUANTITATIVE: D-Dimer, Quant: 0.35 ug/mL-FEU (ref 0.00–0.50)

## 2019-03-21 LAB — SARS CORONAVIRUS 2 BY RT PCR (HOSPITAL ORDER, PERFORMED IN ~~LOC~~ HOSPITAL LAB): SARS Coronavirus 2: NEGATIVE

## 2019-03-21 MED ORDER — AZITHROMYCIN 250 MG PO TABS: 250.0000 mg | ORAL_TABLET | Freq: Every day | ORAL | 0 refills | Status: DC

## 2019-03-21 MED ORDER — BENZONATATE 100 MG PO CAPS: 200.0000 mg | ORAL_CAPSULE | Freq: Two times a day (BID) | ORAL | 0 refills | Status: DC | PRN

## 2019-03-21 NOTE — Discharge Instructions (Addendum)
Your COVID test was negative.  You other labs and chest x-ray were all reassuring.  Please follow-up with your doctor.

## 2019-03-21 NOTE — ED Provider Notes (Signed)
[facility] DEPT Provider Note   CSN: [id number] Arrival date & time: 03/20/19  1518     History   Chief Complaint Chief Complaint  Patient presents with  . Chest Pain  . Shortness of Breath    HPI RUBA OUTEN is a 43 y.o. female.     Patient presents to the emergency department with a chief complaint of left-sided chest pain and shortness of breath.  She states the symptoms have been ongoing for the past 3 days.  Past medical history is notable for prior DVT and prior breast cancer.  She is not anticoagulated.  She denies any known exposures to coronavirus.  Denies any recent travel, surgery, or immobilization.  She states that the pain has been fairly constant, but does wax and wane in severity.  She denies any measured fevers, but is notable to have triage temperature of 100.2 degrees.  The history is provided by the patient. No language interpreter was used.    Past Medical History:  Diagnosis Date  . Anemia   . Back pain    lumbar   . Breast cancer ([place]) 2012   right  . Cough 09/07/2012  . Depression    no current med.  . Diabetes mellitus without complication ([place])   . Gastric ulcer   . GERD (gastroesophageal reflux disease)   . H/O scoliosis   . Headache(784.0)    hx. migraine in childhood  . Hypertension    under control with med., has been on med. x 5 yr.  . Peripheral neuropathy    s/p chemotherapy  . S/P radiation therapy 12/19/11 - 02/03/12   Right Chest Wall and Axilla  . Status post chemotherapy    finished 08/2011    Patient Active Problem List   Diagnosis Date Noted  . Influenza-like illness 09/29/2016  . Diabetes mellitus type II, non insulin dependent ([place]) 10/05/2015  . Obesity, morbid, BMI 40.0-49.9 ([place]) 10/05/2015  . Obesity 05/23/2014  . Diabetes type 2, uncontrolled ([place]) 05/23/2014  . Malignant neoplasm of upper-outer quadrant of right breast in female, estrogen receptor positive ([place]) 09/09/2013  .  History of DVT (deep vein thrombosis) 01/09/2012  . Peripheral neuropathy   . Iron deficiency anemia 06/29/2011  . Hypertension 02/28/2011    Past Surgical History:  Procedure Laterality Date  . ESOPHAGOGASTRODUODENOSCOPY (EGD) WITH PROPOFOL  09/06/2012   Procedure: ESOPHAGOGASTRODUODENOSCOPY (EGD) WITH PROPOFOL;  Surgeon: Milus Banister, MD;  Location: [facility];  Service: Endoscopy;  Laterality: N/A;  . HIP PINNING  age 45   bilateral  . MASTECTOMY W/ SENTINEL NODE BIOPSY  10/14/2011   Procedure: MASTECTOMY WITH SENTINEL LYMPH NODE BIOPSY;  Surgeon: Merrie Roof, MD;  Location: [facility];  Service: General;  Laterality: Right;  . PORT-A-CATH REMOVAL  09/12/2012   Procedure: REMOVAL PORT-A-CATH;  Surgeon: Merrie Roof, MD;  Location: [facility];  Service: General;  Laterality: Left;  . PORTACATH PLACEMENT  04/08/2011  . WOUND EXPLORATION  10/14/2011   right mastectomy wound     OB History    Gravida  5   Para  2   Term  1   Preterm  1   AB  3   Living  2     SAB  3   TAB      Ectopic      Multiple      Live Births  2        Obstetric Comments  Menarche age 9 [place] P2  First pregnancy to term age 66 1 miscarriage No birth control pills         Home Medications    Prior to Admission medications   Medication Sig Start Date End Date Taking? Authorizing Provider  amLODipine (NORVASC) 10 MG tablet Take 1 tablet (10 mg total) by mouth daily. 11/12/17   Tereasa Coop, PA-C  anastrozole (ARIMIDEX) 1 MG tablet TAKE ONE TABLET BY MOUTH ONCE DAILY 12/17/18   Magrinat, Virgie Dad, MD  B Complex-C (SUPER B COMPLEX PO) Take 1 tablet by mouth daily.    [provider]  benazepril-hydrochlorthiazide (LOTENSIN HCT) 20-12.5 MG tablet Take 1 tablet by mouth daily. 11/12/17   Tereasa Coop, PA-C  benazepril-hydrochlorthiazide (LOTENSIN HCT) 20-12.5 MG tablet TAKE ONE TABLET BY MOUTH DAILY 03/02/18   Magrinat, Virgie Dad, MD  cyclobenzaprine  (FLEXERIL) 10 MG tablet Take 1 tablet (10 mg total) by mouth 2 (two) times daily as needed for muscle spasms. 05/16/17   Recardo Evangelist, PA-C  goserelin (ZOLADEX) 10.8 MG injection Inject 10.8 mg into the skin every 3 (three) months. 01/18/12   Magrinat, Virgie Dad, MD  naproxen sodium (ANAPROX) 220 MG tablet Take 220 mg by mouth 2 (two) times daily with a meal.    [provider]  omeprazole (PRILOSEC) 20 MG capsule Take 20 mg by mouth daily.    [provider]  ranitidine (ZANTAC) 75 MG tablet Take 75 mg by mouth as needed. ONLY IF OUT OF PROTONIX    [provider]    Family History Family History  Problem Relation Age of Onset  . Heart disease Father        heart attack  . Heart attack Father        died age 18  . Hypertension Father   . Hypertension Mother   . Anemia Mother   . Diabetes Mother   . Stroke Mother   . Aneurysm Mother        brain  . Heart disease Maternal Grandmother        MI  . Hypertension Maternal Grandmother   . Diabetes Maternal Grandmother   . Hypertension Brother   . Diabetes Brother   . Hypertension Paternal Grandmother   . Diabetes Paternal Grandmother     Social History Social History   Tobacco Use  . Smoking status: Former Research scientist (life sciences)  . Smokeless tobacco: Never Used  . Tobacco comment: quit 1998 - states only smoked for 1 month  Substance Use Topics  . Alcohol use: Yes    Comment: rarely  . Drug use: No     Allergies   Oxycodone and Tape   Review of Systems Review of Systems  All other systems reviewed and are negative.    Physical Exam Updated Vital Signs BP (!) 147/98 (BP Location: Left Arm)   Pulse 88   Temp 99.1 F (37.3 C) (Oral)   Resp 18   Ht 5\' 7"  (1.702 m)   Wt 119.6 kg   SpO2 100%   BMI 41.29 kg/m   Physical Exam Vitals signs and nursing note reviewed.  Constitutional:      General: She is not in acute distress.    Appearance: She is well-developed.  HENT:     Head: Normocephalic  and atraumatic.  Eyes:     Conjunctiva/sclera: Conjunctivae normal.  Neck:     Musculoskeletal: Neck supple.  Cardiovascular:     Rate and Rhythm: Normal rate and  regular rhythm.     Heart sounds: No murmur.  Pulmonary:     Effort: Pulmonary effort is normal. No respiratory distress.     Breath sounds: Normal breath sounds.     Comments: Lungs are clear to auscultation Abdominal:     Palpations: Abdomen is soft.     Tenderness: There is no abdominal tenderness.  Musculoskeletal: Normal range of motion.        General: No swelling or tenderness.     Right lower leg: No edema.     Left lower leg: No edema.  Skin:    General: Skin is warm and dry.  Neurological:     Mental Status: She is alert and oriented to person, place, and time.  Psychiatric:        Mood and Affect: Mood normal.        Behavior: Behavior normal.        Thought Content: Thought content normal.        Judgment: Judgment normal.      ED Treatments / Results  Labs (all labs ordered are listed, but only abnormal results are displayed) Labs Reviewed  BASIC METABOLIC PANEL - Abnormal; Notable for the following components:      Result Value   Glucose, Bld 359 (*)    All other components within normal limits  CBC - Abnormal; Notable for the following components:   Hemoglobin 11.5 (*)    MCH 24.4 (*)    Platelets 402 (*)    All other components within normal limits  SARS CORONAVIRUS 2 (HOSPITAL ORDER, [facility])  D-DIMER, QUANTITATIVE (NOT AT [facility])  I-STAT BETA HCG BLOOD, ED ([facility], [facility], [facility] ONLY)  TROPONIN I (HIGH SENSITIVITY)  TROPONIN I (HIGH SENSITIVITY)    EKG None  Radiology Dg Chest 2 View  Result Date: 03/20/2019 CLINICAL DATA:  Chest pain EXAM: CHEST - 2 VIEW COMPARISON:  None. FINDINGS: The heart size and mediastinal contours are within normal limits. Both lungs are clear. The visualized skeletal structures are unremarkable. IMPRESSION: No acute cardiopulmonary  process. Electronically Signed   By: Prudencio Pair M.D.   On: 03/20/2019 17:34    Procedures Procedures (including critical care time)  Medications Ordered in ED Medications - No data to display   Initial Impression / Assessment and Plan / ED Course  I have reviewed the triage vital signs and the nursing notes.  Pertinent labs & imaging results that were available during my care of the patient were reviewed by me and considered in my medical decision making (see chart for details).        Patient with left-sided chest pain and shortness of breath x3 days.  She has had some cough.  Noted to have temperature of 100.2 degrees in triage.  She did not have any known coronavirus exposures.  She does have a history of DVT and history of breast cancer.  Will check d-dimer given risk factors.  Was noted to be tachycardic to 110.  We will also check coronavirus swab.  D-dimer is negative.  Doubt PE.    COVID negative.  States this feels similar to bronchitis that she has had in the past.  CXR negative.   JAELIE AGUILERA was evaluated in Emergency Department on 03/21/2019 for the symptoms described in the history of present illness. She was evaluated in the context of the global COVID-19 pandemic, which necessitated consideration that the patient might be at risk for infection with the SARS-CoV-2  virus that causes COVID-19. Institutional protocols and algorithms that pertain to the evaluation of patients at risk for COVID-19 are in a state of rapid change based on information released by regulatory bodies including the CDC and federal and state organizations. These policies and algorithms were followed during the patient's care in the ED.   Final Clinical Impressions(s) / ED Diagnoses   Final diagnoses:  Cough  SOB (shortness of breath)  Chest pain, unspecified type    ED Discharge Orders         Ordered    azithromycin (ZITHROMAX) 250 MG tablet  Daily     03/21/19 0251    benzonatate  (TESSALON) 100 MG capsule  2 times daily PRN     03/21/19 0251           Montine Circle, PA-C 03/21/19 0300    Ripley Fraise, MD 03/21/19 [phone]

## 2019-04-11 MED FILL — ANASTROZOLE 1 MG TABLET: 1 | 30 days supply | Qty: 30 | Fill #3

## 2019-04-23 ENCOUNTER — Ambulatory Visit: Payer: Managed Care, Other (non HMO)

## 2019-05-09 ENCOUNTER — Ambulatory Visit: Payer: Managed Care, Other (non HMO)

## 2019-05-17 MED FILL — ANASTROZOLE 1 MG TABLET: 1 | 30 days supply | Qty: 30 | Fill #4

## 2019-05-27 ENCOUNTER — Other Ambulatory Visit: Payer: Self-pay | Admitting: *Deleted

## 2019-05-27 DIAGNOSIS — C50411 Malignant neoplasm of upper-outer quadrant of right female breast: Secondary | ICD-10-CM

## 2019-05-28 ENCOUNTER — Other Ambulatory Visit: Payer: Self-pay

## 2019-05-28 ENCOUNTER — Encounter: Payer: Self-pay | Admitting: Oncology

## 2019-05-28 ENCOUNTER — Inpatient Hospital Stay (HOSPITAL_BASED_OUTPATIENT_CLINIC_OR_DEPARTMENT_OTHER): Payer: Self-pay | Admitting: Oncology

## 2019-05-28 ENCOUNTER — Inpatient Hospital Stay: Payer: Self-pay | Attending: Oncology

## 2019-05-28 ENCOUNTER — Inpatient Hospital Stay: Payer: Self-pay

## 2019-05-28 VITALS — BP 138/96 | HR 78 | Temp 98.5°F | Resp 18 | Ht 67.0 in | Wt 261.6 lb

## 2019-05-28 DIAGNOSIS — Z9011 Acquired absence of right breast and nipple: Secondary | ICD-10-CM | POA: Insufficient documentation

## 2019-05-28 DIAGNOSIS — Z9223 Personal history of estrogen therapy: Secondary | ICD-10-CM | POA: Insufficient documentation

## 2019-05-28 DIAGNOSIS — C50411 Malignant neoplasm of upper-outer quadrant of right female breast: Secondary | ICD-10-CM

## 2019-05-28 DIAGNOSIS — Z923 Personal history of irradiation: Secondary | ICD-10-CM | POA: Insufficient documentation

## 2019-05-28 DIAGNOSIS — Z853 Personal history of malignant neoplasm of breast: Secondary | ICD-10-CM | POA: Insufficient documentation

## 2019-05-28 DIAGNOSIS — Z170 Estrogen receptor positive status [ER+]: Secondary | ICD-10-CM | POA: Insufficient documentation

## 2019-05-28 DIAGNOSIS — Z9221 Personal history of antineoplastic chemotherapy: Secondary | ICD-10-CM | POA: Insufficient documentation

## 2019-05-28 DIAGNOSIS — E1165 Type 2 diabetes mellitus with hyperglycemia: Secondary | ICD-10-CM

## 2019-05-28 DIAGNOSIS — G629 Polyneuropathy, unspecified: Secondary | ICD-10-CM

## 2019-05-28 LAB — CMP (CANCER CENTER ONLY)
ALT: 21 U/L (ref 0–44)
AST: 19 U/L (ref 15–41)
Albumin: 3.8 g/dL (ref 3.5–5.0)
Alkaline Phosphatase: 139 U/L — ABNORMAL HIGH (ref 38–126)
Anion gap: 12 (ref 5–15)
BUN: 12 mg/dL (ref 6–20)
CO2: 25 mmol/L (ref 22–32)
Calcium: 9.4 mg/dL (ref 8.9–10.3)
Chloride: 99 mmol/L (ref 98–111)
Creatinine: 0.94 mg/dL (ref 0.44–1.00)
GFR, Est AFR Am: >60 mL/min (ref >60)
GFR, Estimated: >60 mL/min (ref >60)
Glucose, Bld: 442 mg/dL — ABNORMAL HIGH (ref 70–99)
Potassium: 3.7 mmol/L (ref 3.5–5.1)
Sodium: 136 mmol/L (ref 135–145)
Total Bilirubin: <0.2 mg/dL — ABNORMAL LOW (ref 0.3–1.2)
Total Protein: 7.6 g/dL (ref 6.5–8.1)

## 2019-05-28 LAB — CBC WITH DIFFERENTIAL (CANCER CENTER ONLY)
Abs Immature Granulocytes: 0.03 10*3/uL (ref 0.00–0.07)
Basophils Absolute: 0 10*3/uL (ref 0.0–0.1)
Basophils Relative: 0 %
Eosinophils Absolute: 0.3 10*3/uL (ref 0.0–0.5)
Eosinophils Relative: 4 %
HCT: 36.7 % (ref 36.0–46.0)
Hemoglobin: 11.1 g/dL — ABNORMAL LOW (ref 12.0–15.0)
Immature Granulocytes: 0 %
Lymphocytes Relative: 29 %
Lymphs Abs: 2 10*3/uL (ref 0.7–4.0)
MCH: 24.1 pg — ABNORMAL LOW (ref 26.0–34.0)
MCHC: 30.2 g/dL (ref 30.0–36.0)
MCV: 79.6 fL — ABNORMAL LOW (ref 80.0–100.0)
Monocytes Absolute: 0.6 10*3/uL (ref 0.1–1.0)
Monocytes Relative: 9 %
Neutro Abs: 4.1 10*3/uL (ref 1.7–7.7)
Neutrophils Relative %: 58 %
Platelet Count: 381 10*3/uL (ref 150–400)
RBC: 4.61 MIL/uL (ref 3.87–5.11)
RDW: 13.9 % (ref 11.5–15.5)
WBC Count: 7.1 10*3/uL (ref 4.0–10.5)
nRBC: 0 % (ref 0.0–0.2)

## 2019-05-28 NOTE — Progress Notes (Deleted)
Per Val, RN pt will not receive injection today.

## 2019-05-28 NOTE — Progress Notes (Signed)
ID: [name]   DOB: 26-Apr-1976  MR#: [id number]  RSW#:[id number]  PCP: Lin Landsman, MD GYN: SU: Autumn Messing, MD [place]:  Gery Pray, MD OTHER MD:  Owens Loffler, MD   CHIEF COMPLAINT:  Right Breast Cancer  CURRENT THERAPY: Completing 7 years of anastrozole, goserelin  BREAST CANCER HISTORY: From the original intake note:  The patient developed pain in her right breast and brought it to her primary care physician's attention. Mammography at [facility] 03/10/2011 found the breasts to be heterogeneously dense. There was a mildly irregular mass posteriorly in the upper outer quadrant of the right breast. On ultrasound this was hypoechoic and measured approximately 2.4 cm, with irregular margins. The left breast was unremarkable.   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 Bilateral breast MRIs were obtained 03/18/2011, showing in the upper outer quadrant of the right breast an irregular mass measuring 5.5 cm. There was a suggestion of cortical thickening in the level I right axillary lymph node, which measured 1.2 cm. The patient was staged with chest CT scan and PET scan, which showed no distant disease. These studies did confirm the abnormality in the right breast as well as a hypermetabolic right axillary lymph node, making this a clinical T3 N1 or stage III invasive ductal carcinoma.   She completed neoadjuvant chemotherapy and proceeded to surgery and subsequent treatment as detailed below..   INTERVAL HISTORY: Mistina returns today for follow-up and treatment of her estrogen receptor positive breast cancer. She was last seen here on 05/22/2018.   She continues on anastrozole.  She tolerates this generally well.  She is also on goserelin which she receives every 3 months, with a dose due today.  The hot flashes have  become better.  Vaginal dryness is not an issue.  Donnajean's last bone density screening on 08/18/2016, showed a T-score of -2.3, which is considered osteopenic.    Since her last visit here, she underwent Left knee xray on 09/26/2018 showing: Mild tricompartmental degenerative changes and small knee joint effusion. Bipartite patella.  Also at the last visit a year ago she had some right leg swelling.  We obtained a Doppler 06/11/2018 which showed no evidence of clot.  Her most recent mammogram was 08/24/2017 at [facility].  It showed a breast density of category 8, meaning almost complete fat replacement.  There was no evidence of malignancy.   REVIEW OF SYSTEMS: Chelsie is working long hours.  She gets more than 10,000 steps most days at work.  She is taking appropriate pandemic precautions.  A detailed review of systems today was otherwise unremarkable.    PAST MEDICAL HISTORY: Past Medical History:  Diagnosis Date  . Anemia   . Back pain    lumbar   . Breast cancer ([place]) 2012   right  . Cough 09/07/2012  . Depression    no current med.  . Diabetes mellitus without complication ([place])   . Gastric ulcer   . GERD (gastroesophageal reflux disease)   . H/O scoliosis   . Headache(784.0)    hx. migraine in childhood  . Hypertension    under control with med., has been on med. x 5 yr.  . Peripheral neuropathy    s/p chemotherapy  . S/P radiation therapy 12/19/11 - 02/03/12   Right Chest Wall and Axilla  . Status post chemotherapy  finished 08/2011    PAST SURGICAL HISTORY: Past Surgical History:  Procedure Laterality Date  . ESOPHAGOGASTRODUODENOSCOPY (EGD) WITH PROPOFOL  09/06/2012   Procedure: ESOPHAGOGASTRODUODENOSCOPY (EGD) WITH PROPOFOL;  Surgeon: Milus Banister, MD;  Location: [facility];  Service: Endoscopy;  Laterality: N/A;  . HIP PINNING  age 61   bilateral  . MASTECTOMY W/ SENTINEL NODE BIOPSY  10/14/2011   Procedure: MASTECTOMY WITH SENTINEL LYMPH NODE BIOPSY;  Surgeon:  Merrie Roof, MD;  Location: [facility];  Service: General;  Laterality: Right;  . PORT-A-CATH REMOVAL  09/12/2012   Procedure: REMOVAL PORT-A-CATH;  Surgeon: Merrie Roof, MD;  Location: [place] [facility];  Service: General;  Laterality: Left;  . PORTACATH PLACEMENT  04/08/2011  . WOUND EXPLORATION  10/14/2011   right mastectomy wound    FAMILY HISTORY Family History  Problem Relation Age of Onset  . Heart disease Father        heart attack  . Heart attack Father        died age 8  . Hypertension Father   . Hypertension Mother   . Anemia Mother   . Diabetes Mother   . Stroke Mother   . Aneurysm Mother        brain  . Heart disease Maternal Grandmother        MI  . Hypertension Maternal Grandmother   . Diabetes Maternal Grandmother   . Hypertension Brother   . Diabetes Brother   . Hypertension Paternal Grandmother   . Diabetes Paternal Grandmother   [name] died from a stroke at age 38. The patient's father died from a myocardial infarction at the age of 51. She has one sister and one brother. There is no history of breast or ovarian cancer in the family.   GYNECOLOGIC HISTORY: Menarche age 55. She was premenopausal at the time of diagnosis. She has never used birth control pills. She is GX P2. First pregnancy to term age 89. She had her last period at the time of her surgery, continues on goserelin every 3 months.  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 Her older daughter [name] currently is working 2 jobs. She hopes to become an Chief Financial Officer.. Her other daughter, [name], is currently 23 and will finish high school in 2018. She has not yet decided if she wants to go to college. The patient is separtated from her husband, Tracee Mccreery, who works in Architect.  Both the patient's daughters still live at home with her.   ADVANCED DIRECTIVES:at the 08/09/2016 visit the patient was given the  appropriate documents to complete and notarize at her discretion. She intends to name her older daughter as healthcare part of attorney. She will get those documents motorized and bring [place] a copy   HEALTH MAINTENANCE: (Updated 06/25/2013) Social History   Tobacco Use  . Smoking status: Former Research scientist (life sciences)  . Smokeless tobacco: Never Used  . Tobacco comment: quit 1998 - states only smoked for 1 month  Substance Use Topics  . Alcohol use: Yes    Comment: rarely  . Drug use: No     Colonoscopy: Never  PAP: Overdue   Bone density: 03/17/2014 t-score 0.0 (normal)  Lipid panel: Dr. Darron Doom    Allergies  Allergen Reactions  . Oxycodone Other (See Comments)    headache  . Tape Other (See Comments)    SKIN TEARS    Current Outpatient Medications  Medication Sig Dispense Refill  . amLODipine (NORVASC) 10 MG tablet Take 1 tablet (10 mg total) by mouth daily. 30 tablet 0  . benazepril-hydrochlorthiazide (LOTENSIN HCT) 20-12.5 MG tablet TAKE ONE TABLET BY MOUTH DAILY 30 tablet 0  . benzonatate (TESSALON) 100 MG capsule Take 2 capsules (200 mg total) by mouth 2 (two) times daily as needed for cough. 20 capsule 0  . cyclobenzaprine (FLEXERIL) 10 MG tablet Take 1 tablet (10 mg total) by mouth 2 (two) times daily as needed for muscle spasms. 20 tablet 0  . goserelin (ZOLADEX) 10.8 MG injection Inject 10.8 mg into the skin every 3 (three) months.    . naproxen sodium (ANAPROX) 220 MG tablet Take 220 mg by mouth 2 (two) times daily with a meal.    . omeprazole (PRILOSEC) 20 MG capsule Take 20 mg by mouth daily.    . ranitidine (ZANTAC) 75 MG tablet Take 75 mg by mouth as needed. ONLY IF OUT OF PROTONIX     No current facility-administered medications for this visit.     OBJECTIVE: Morbidly obese African-American woman who appears stated age  37:   05/28/19 1523  BP: (!) 138/96  Pulse: 78  Resp: 18  Temp: 98.5 F (36.9 C)  SpO2: 91%   Wt Readings from Last 3 Encounters:  05/28/19 261  lb 9.6 oz (118.7 kg)  03/20/19 263 lb 9.6 oz (119.6 kg)  05/22/18 264 lb 6.4 oz (119.9 kg)   Body mass index is 40.97 kg/m.    ECOG FS:1 - Symptomatic but completely ambulatory  Ocular: Sclerae unicteric, pupils round and equal Ear-nose-throat: Wearing a mask Lymphatic: No cervical or supraclavicular adenopathy Lungs no rales or rhonchi Heart regular rate and rhythm Abd soft, nontender, positive bowel sounds MSK no focal spinal tenderness, no joint edema Neuro: non-focal, well-oriented, appropriate affect Breasts: The right breast is status post mastectomy and radiation.  There is no evidence of chest wall recurrence.  The left breast is benign.  Both axillae are benign.   LAB RESULTS: Lab Results  Component Value Date   WBC 7.1 05/28/2019   NEUTROABS 4.1 05/28/2019   HGB 11.1 (L) 05/28/2019   HCT 36.7 05/28/2019   MCV 79.6 (L) 05/28/2019   PLT 381 05/28/2019      Chemistry      Component Value Date/Time   NA 136 05/28/2019 1511   NA 143 07/19/2017 1559   K 3.7 05/28/2019 1511   K 3.8 07/19/2017 1559   CL 99 05/28/2019 1511   CL 101 12/21/2012 1019   CO2 25 05/28/2019 1511   CO2 28 07/19/2017 1559   BUN 12 05/28/2019 1511   BUN 9.1 07/19/2017 1559   CREATININE 0.94 05/28/2019 1511   CREATININE 0.8 07/19/2017 1559      Component Value Date/Time   CALCIUM 9.4 05/28/2019 1511   CALCIUM 9.5 07/19/2017 1559   ALKPHOS 139 (H) 05/28/2019 1511   ALKPHOS 123 07/19/2017 1559   AST 19 05/28/2019 1511   AST 19 07/19/2017 1559   ALT 21 05/28/2019 1511   ALT 17 07/19/2017 1559   BILITOT <0.2 (L) 05/28/2019 1511   BILITOT 0.39 07/19/2017 1559       STUDIES: No results found.   ASSESSMENT: 43 y.o.  BRCA 1-2 negative [place] woman   (1) status post right breast upper outer quadrant biopsy July of 2012 for a clinical T3 N1 (stage III) invasive ductal carcinoma, grade 3, 99% estrogen and  100% progesterone receptor positive, HER-2 negative,  with an MIB-1 of 98%.    (2) Status post 4 cycles of dose dense doxorubicin and cyclophosphamide, followed by weekly paclitaxel x 8, completed 08/25/2011  (3) s/p right mastectomy and sentinel lymph node sampling 10/14/2011 for a residual ypT2 ypN1(mic) invasive ductal carcinoma, grade 3, with ample margins  (4) status post radiation therapy under the care of Dr. Sondra Come,  completed in mid June.  (5)  Anti-estrogen therapy started 01/18/2012 with first Zoladex injection, continued every 3 months. Anastrozole started in late June 2013, the goal being to continue for at least 7 years (June 2020).  (a) Bone density at [facility] 08/17/16 shows a T score of -2.3 in the AP spine  (6) remote history of DVT  PLAN:   Kayloni is 7-1/2 years out from definitive surgery for her breast cancer with no evidence of disease recurrence.  This is very favorable.  She has completed 7 years of anastrozole.  We have data that 7 years is as good as 10 and so she may stop anastrozole at this point.  We started her on goserelin so that she could receive the anastrozole but now that that drug is stopped, she does not have to continue the ovarian suppression.  I gave her the choice of continuing anyway simply to avoid problems with periods and contraception, but she hates that shots and is very glad to discontinue those.  In short she is ready to "graduate".  At this point I am comfortable releasing her to her primary care physician.  All she will need in terms of breast cancer follow-up is a yearly left screening mammography, which she obtains in January at [facility], and a yearly physician breast and chest wall exam  I will be glad to see Katelee again at any point in the future if and when the need arises but as of now are making no further routine appointments for her here.   Meegan Shanafelt, Virgie Dad, MD  05/28/19 4:20 PM Medical Oncology and Hematology [facility] [place], [place] [address] Tel. [phone]    Fax.  [phone]  I, Jacqualyn Posey am acting as a Education administrator for Chauncey Cruel, MD.   I, Lurline Del MD, have reviewed the above documentation for accuracy and completeness, and I agree with the above.

## 2019-05-28 NOTE — Progress Notes (Signed)
Met with uninsured patient to introduce myself as Arboriculturist and to offer available resources.  Asked about insurance and whether or not she has applied for Medicaid. Patient states she lost her job and insurance but is currently working a new job.Patient has Family Planning Medicaid only which is what she was approved for. Advised patient she may reach out to her caseworker at [facility] to apply for full Medicaid if eligible. Also advised patient she will automatically receive a 56% discount for being uninsured and must apply for Medicaid to receive any additional discounts through the [facility] FAA.  Discussed the one-time $1000 Radio broadcast assistant. Patient thinks she is over the income for a household of 1 being financially responsible for. Gave her my card if interested in applying and for any additional financial questions or concerns.   Advised patient of the transportation program if needed for appointments.

## 2019-05-29 ENCOUNTER — Other Ambulatory Visit: Payer: Self-pay | Admitting: Oncology

## 2019-05-29 ENCOUNTER — Telehealth: Payer: Self-pay

## 2019-05-29 LAB — FOLLICLE STIMULATING HORMONE: FSH: 3.9 m[IU]/mL

## 2019-05-29 NOTE — Progress Notes (Unsigned)
[name]'s blood sugar was unexpectedly high.  Someone has been checking this for her over the last few months.  We are calling her and alerting her that this really needs attention at this point.

## 2019-05-29 NOTE — Telephone Encounter (Signed)
Per Dr. Jana Hakim TC to Pt. To inform her of blood glucose level from labs on 10/6 Pt's glucose level is 442 Pt informed, she states she is not taking any medication for her diabetes. Informed Pt. That Dr Jana Hakim  recommends her to follow up with her PCP. Pt verbalized understanding.

## 2019-05-31 LAB — ESTRADIOL, ULTRA SENS: Estradiol, Sensitive: <2.5 pg/mL

## 2020-04-02 ENCOUNTER — Encounter: Payer: Self-pay | Admitting: Genetic Counselor

## 2020-04-28 ENCOUNTER — Telehealth: Payer: Self-pay | Admitting: Oncology

## 2020-04-28 NOTE — Telephone Encounter (Signed)
Release: [id number] Faxed medical record to [facility] @ fax#[phone]

## 2020-07-26 IMAGING — CR CHEST - 2 VIEW
2 series · 2 of 2 positions shown · non-contrast
Comparison: None.

CLINICAL DATA: Chest pain

EXAM:
CHEST - 2 VIEW

[w chest pa]
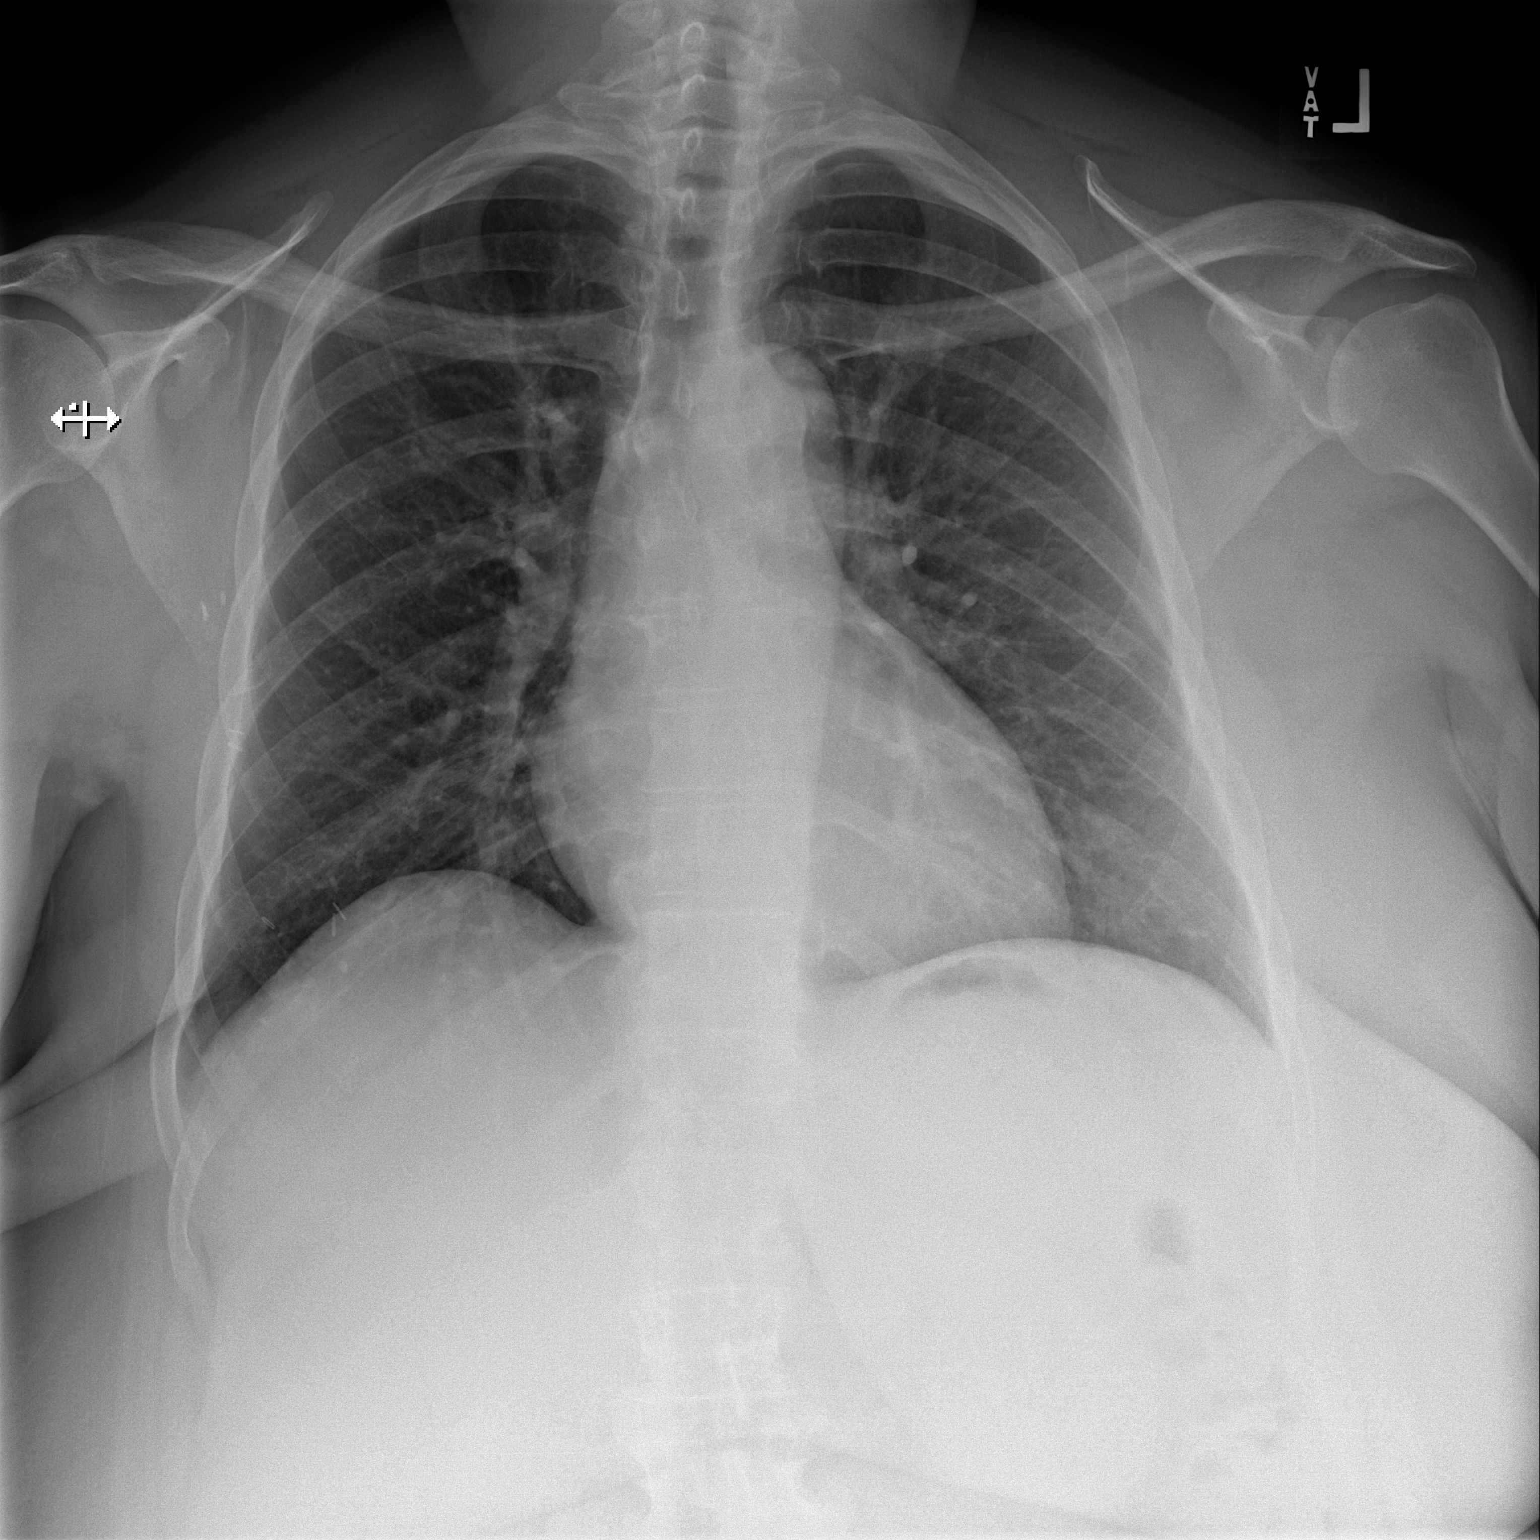

[w chest lat]
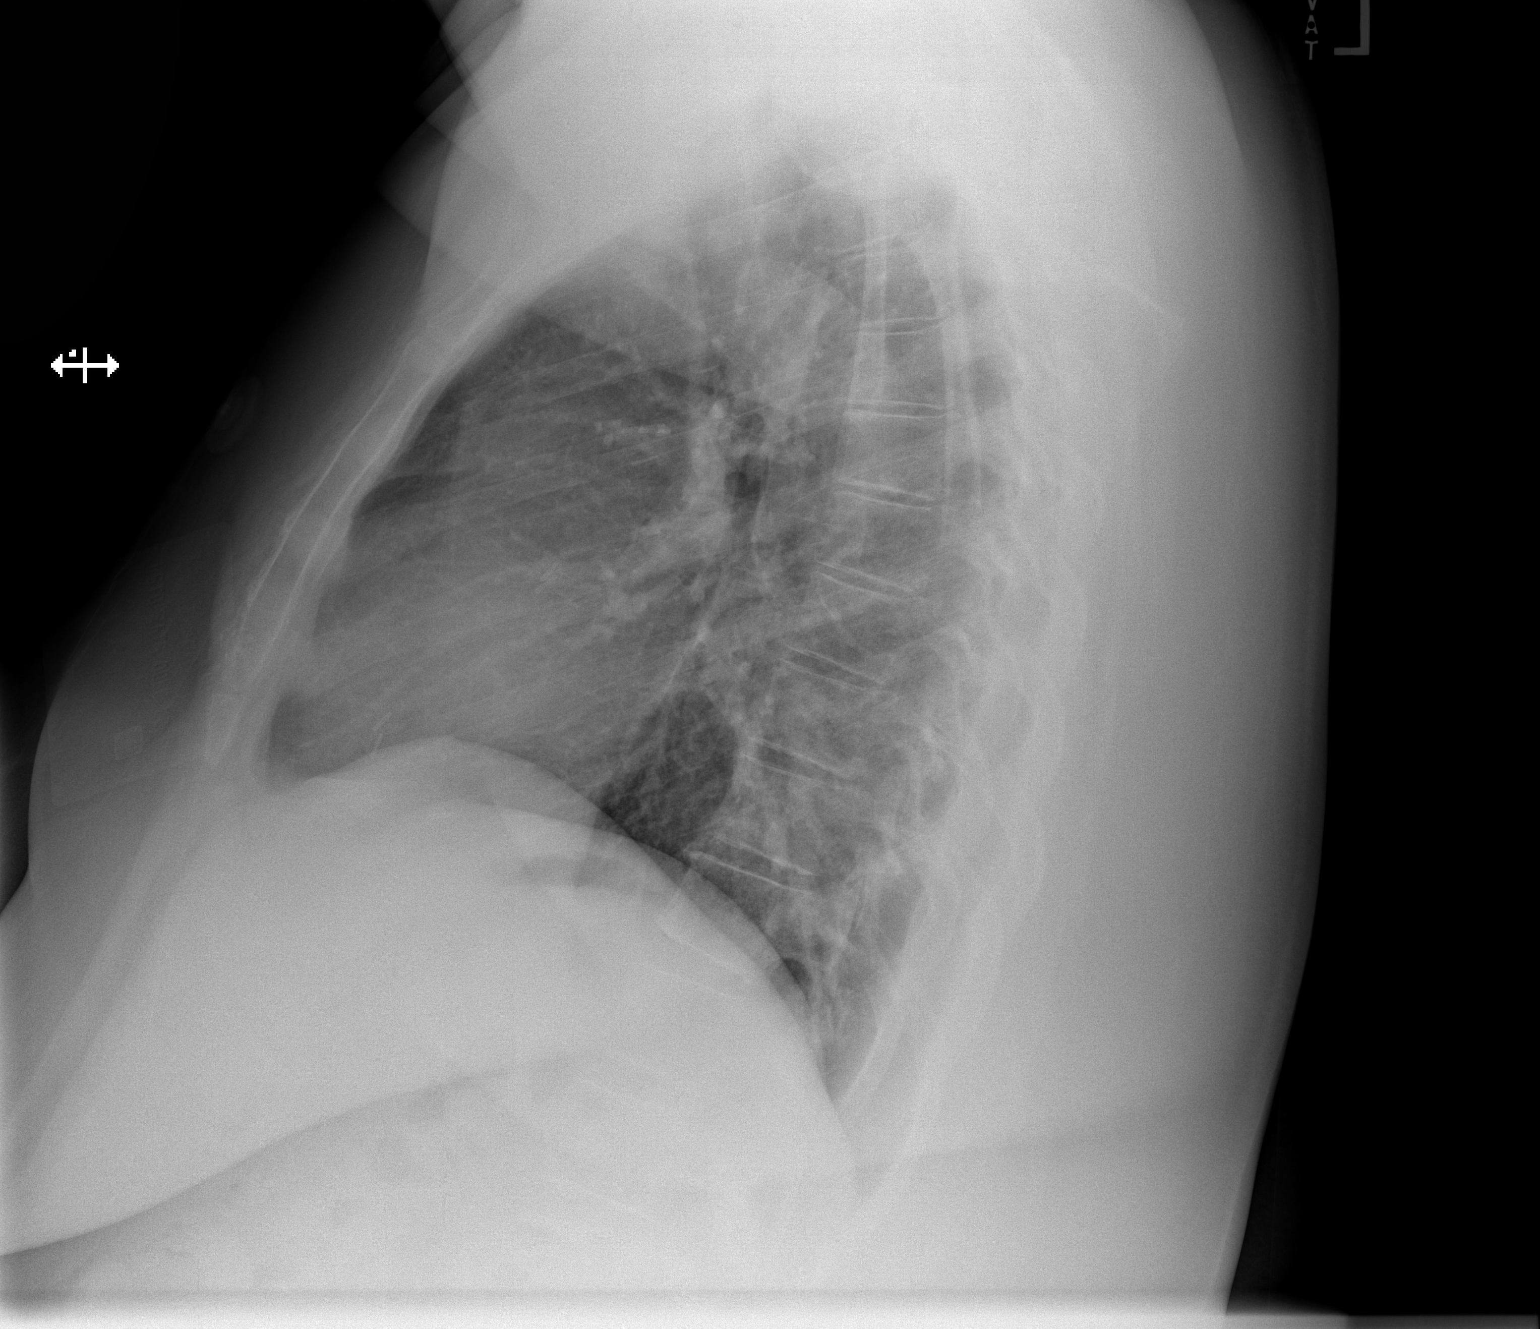

[2 of 2 positions shown; findings below may reference images not displayed]

FINDINGS: The heart size and mediastinal contours are within normal limits.
Both lungs are clear. The visualized skeletal structures are
unremarkable.
IMPRESSION: No acute cardiopulmonary process.

## 2020-11-22 ENCOUNTER — Other Ambulatory Visit: Payer: Self-pay

## 2020-11-22 ENCOUNTER — Encounter (HOSPITAL_COMMUNITY): Payer: Self-pay | Admitting: *Deleted

## 2020-11-22 ENCOUNTER — Ambulatory Visit (HOSPITAL_COMMUNITY)
Admission: EM | Admit: 2020-11-22 | Discharge: 2020-11-22 | Disposition: A | Discharge status: Home / Self Care | Payer: BC Managed Care – PPO | Attending: Student | Admitting: Student

## 2020-11-22 ENCOUNTER — Ambulatory Visit (INDEPENDENT_AMBULATORY_CARE_PROVIDER_SITE_OTHER): Payer: BC Managed Care – PPO

## 2020-11-22 DIAGNOSIS — S99822A Other specified injuries of left foot, initial encounter: Secondary | ICD-10-CM

## 2020-11-22 DIAGNOSIS — A084 Viral intestinal infection, unspecified: Secondary | ICD-10-CM | POA: Diagnosis not present

## 2020-11-22 DIAGNOSIS — Z23 Encounter for immunization: Secondary | ICD-10-CM | POA: Diagnosis not present

## 2020-11-22 DIAGNOSIS — S90852A Superficial foreign body, left foot, initial encounter: Secondary | ICD-10-CM | POA: Diagnosis not present

## 2020-11-22 DIAGNOSIS — J301 Allergic rhinitis due to pollen: Secondary | ICD-10-CM

## 2020-11-22 DIAGNOSIS — G6289 Other specified polyneuropathies: Secondary | ICD-10-CM

## 2020-11-22 DIAGNOSIS — W269XXA Contact with unspecified sharp object(s), initial encounter: Secondary | ICD-10-CM

## 2020-11-22 MED ORDER — TETANUS-DIPHTH-ACELL PERTUSSIS 5-2.5-18.5 LF-MCG/0.5 IM SUSY
0.5000 mL | PREFILLED_SYRINGE | Freq: Once | INTRAMUSCULAR | Status: AC
  Administered 2020-11-22: 0.5 mL via INTRAMUSCULAR

## 2020-11-22 MED ORDER — CETIRIZINE HCL 10 MG PO TABS: 10.0000 mg | ORAL_TABLET | Freq: Every day | ORAL | 0 refills | Status: DC

## 2020-11-22 MED ORDER — LOPERAMIDE HCL 2 MG PO CAPS: 2.0000 mg | ORAL_CAPSULE | Freq: Four times a day (QID) | ORAL | 0 refills | Status: DC | PRN

## 2020-11-22 MED ORDER — ONDANSETRON HCL 8 MG PO TABS: 8.0000 mg | ORAL_TABLET | Freq: Three times a day (TID) | ORAL | 0 refills | Status: DC | PRN

## 2020-11-22 MED ORDER — TETANUS-DIPHTH-ACELL PERTUSSIS 5-2.5-18.5 LF-MCG/0.5 IM SUSY
PREFILLED_SYRINGE | INTRAMUSCULAR | Status: AC
  Filled 2020-11-22: qty 0.5

## 2020-11-22 NOTE — ED Triage Notes (Signed)
C/O diarrhea onset 4 days ago; initially had some nausea, which resolved 2 days ago.  Denies fevers.  Reports "stepping on something" 2 days ago with left heel; c/o puncture wound - unsure if foreign body.  C/O painful weight bearing.  C/O sudden onset dyspnea onset approx 2 hrs ago while walking into a store.  Denies any chest pain.  C/O slight cough.  Breathing non-labored, RR 18.  Talking in complete sentences without difficulty.

## 2020-11-22 NOTE — ED Provider Notes (Signed)
[place]    CSN: [id number] Arrival date & time: 11/22/20  1720      History   Chief Complaint Chief Complaint  Patient presents with  . Diarrhea  . Foot Injury  . Shortness of Breath    HPI [name] is a 45 y.o. female presenting with multiple complaints.  Notes diarrhea, foot injury,allergic rhinitis.  Medical history of diabetes, obesity, breast cancer, back pain, peripheral neuropathy, hypertension.  -States that she has had diarrhea for 4 days, initially with some nausea but that has resolved. Generalized crampy abdominal pain. Never any vomiting. Denies any diarrhea today. Eating and drinking normally. Last period was 1 week ago, states she couldn't be pregnant. -States she stepped on something with her left heel 2 days ago and is not sure if there is still a foreign body in the area.  Notes painful weightbearing. Tdap not UTD. Denies sensation changes, pain elsewhere, discharge from the site.  Some decreased sensation at baseline but no changes in this. -Notes occ cough and nasal congestion for few days.  History of allergic rhinitis, she is taking no medications for this currently. Upon presentation she noted dyspnea on exertion, but this seemed to pass throughout visit. Denies fevers/chills, n/v, shortness of breath, chest pain, facial pain, teeth pain, headaches, sore throat, loss of taste/smell, swollen lymph nodes, ear pain.    HPI  Past Medical History:  Diagnosis Date  . Anemia   . Back pain    lumbar   . Breast cancer ([place]) 2012   right  . Cough 09/07/2012  . Depression    no current med.  . Diabetes mellitus without complication ([facility])   . Gastric ulcer   . GERD (gastroesophageal reflux disease)   . H/O scoliosis   . Headache(784.0)    hx. migraine in childhood  . Hypertension    under control with med., has been on med. x 5 yr.  . Peripheral neuropathy    s/p chemotherapy  . S/P radiation therapy 12/19/11 - 02/03/12   Right Chest Wall and  Axilla  . Status post chemotherapy    finished 08/2011    Patient Active Problem List   Diagnosis Date Noted  . Influenza-like illness 09/29/2016  . Diabetes mellitus type II, non insulin dependent ([place]) 10/05/2015  . Obesity, morbid, BMI 40.0-49.9 ([place]) 10/05/2015  . Obesity 05/23/2014  . Diabetes type 2, uncontrolled ([place]) 05/23/2014  . Malignant neoplasm of upper-outer quadrant of right breast in female, estrogen receptor positive ([place]) 09/09/2013  . History of DVT (deep vein thrombosis) 01/09/2012  . Peripheral neuropathy   . Iron deficiency anemia 06/29/2011  . Hypertension 02/28/2011    Past Surgical History:  Procedure Laterality Date  . ESOPHAGOGASTRODUODENOSCOPY (EGD) WITH PROPOFOL  09/06/2012   Procedure: ESOPHAGOGASTRODUODENOSCOPY (EGD) WITH PROPOFOL;  Surgeon: Milus Banister, MD;  Location: [facility];  Service: Endoscopy;  Laterality: N/A;  . HIP PINNING  age 50   bilateral  . MASTECTOMY W/ SENTINEL NODE BIOPSY  10/14/2011   Procedure: MASTECTOMY WITH SENTINEL LYMPH NODE BIOPSY;  Surgeon: Merrie Roof, MD;  Location: [facility];  Service: General;  Laterality: Right;  . PORT-A-CATH REMOVAL  09/12/2012   Procedure: REMOVAL PORT-A-CATH;  Surgeon: Merrie Roof, MD;  Location: [facility];  Service: General;  Laterality: Left;  . PORTACATH PLACEMENT  04/08/2011  . WOUND EXPLORATION  10/14/2011   right mastectomy wound    OB History    Gravida  5  Para  2   Term  1   Preterm  1   AB  3   Living  2     SAB  3   IAB      Ectopic      Multiple      Live Births  2        Obstetric Comments  Menarche age 47 [place] P2  First pregnancy to term age 72 1 miscarriage No birth control pills         Home Medications    Prior to Admission medications   Medication Sig Start Date End Date Taking? Authorizing Provider  amLODipine (NORVASC) 10 MG tablet Take 1 tablet (10 mg total) by mouth daily. 11/12/17  Yes Tereasa Coop, PA-C   benazepril-hydrochlorthiazide (LOTENSIN HCT) 20-12.5 MG tablet TAKE ONE TABLET BY MOUTH DAILY 03/02/18  Yes Magrinat, Virgie Dad, MD  cetirizine (ZYRTEC ALLERGY) 10 MG tablet Take 1 tablet (10 mg total) by mouth daily. 11/22/20  Yes Hazel Sams, PA-C  loperamide (IMODIUM) 2 MG capsule Take 1 capsule (2 mg total) by mouth 4 (four) times daily as needed for diarrhea or loose stools. 11/22/20  Yes Hazel Sams, PA-C  omeprazole (PRILOSEC) 20 MG capsule Take 20 mg by mouth daily.   Yes [provider]  ondansetron (ZOFRAN) 8 MG tablet Take 1 tablet (8 mg total) by mouth every 8 (eight) hours as needed for nausea or vomiting. 11/22/20  Yes Hazel Sams, PA-C  benzonatate (TESSALON) 100 MG capsule Take 2 capsules (200 mg total) by mouth 2 (two) times daily as needed for cough. 03/21/19   Montine Circle, PA-C  cyclobenzaprine (FLEXERIL) 10 MG tablet Take 1 tablet (10 mg total) by mouth 2 (two) times daily as needed for muscle spasms. 05/16/17   Recardo Evangelist, PA-C  goserelin (ZOLADEX) 10.8 MG injection Inject 10.8 mg into the skin every 3 (three) months. 01/18/12   Magrinat, Virgie Dad, MD  naproxen sodium (ANAPROX) 220 MG tablet Take 220 mg by mouth 2 (two) times daily with a meal.    [provider]  ranitidine (ZANTAC) 75 MG tablet Take 75 mg by mouth as needed. ONLY IF OUT OF PROTONIX    [provider]    Family History Family History  Problem Relation Age of Onset  . Heart disease Father        heart attack  . Heart attack Father        died age 56  . Hypertension Father   . Hypertension Mother   . Anemia Mother   . Diabetes Mother   . Stroke Mother   . Aneurysm Mother        brain  . Heart disease Maternal Grandmother        MI  . Hypertension Maternal Grandmother   . Diabetes Maternal Grandmother   . Hypertension Brother   . Diabetes Brother   . Hypertension Paternal Grandmother   . Diabetes Paternal Grandmother     Social History Social History    Tobacco Use  . Smoking status: Former Research scientist (life sciences)  . Smokeless tobacco: Never Used  . Tobacco comment: quit 1998 - states only smoked for 1 month  Vaping Use  . Vaping Use: Never used  Substance Use Topics  . Alcohol use: Yes    Comment: rarely  . Drug use: No     Allergies   Oxycodone and Tape   Review of Systems Review of Systems  Constitutional: Negative  for appetite change, chills and fever.  HENT: Positive for congestion. Negative for ear pain, rhinorrhea, sinus pressure, sinus pain and sore throat.   Eyes: Negative for redness and visual disturbance.  Respiratory: Positive for cough. Negative for chest tightness, shortness of breath and wheezing.   Cardiovascular: Negative for chest pain and palpitations.  Gastrointestinal: Positive for diarrhea. Negative for abdominal pain, constipation, nausea and vomiting.  Genitourinary: Negative for dysuria, frequency and urgency.  Musculoskeletal: Negative for myalgias.  Skin: Positive for wound.  Neurological: Negative for dizziness, weakness and headaches.  Psychiatric/Behavioral: Negative for confusion.  All other systems reviewed and are negative.    Physical Exam Triage Vital Signs ED Triage Vitals  Enc Vitals Group     BP 11/22/20 1747 117/79     Pulse Rate 11/22/20 1747 99     Resp 11/22/20 1747 18     Temp 11/22/20 1747 98.6 F (37 C)     Temp Source 11/22/20 1747 Temporal     SpO2 11/22/20 1747 99 %     Weight --      Height --      Head Circumference --      Peak Flow --      Pain Score 11/22/20 1748 7     Pain Loc --      Pain Edu? --      Excl. in [place]? --    No data found.  Updated Vital Signs BP 117/79   Pulse 99   Temp 98.6 F (37 C) (Temporal)   Resp 18   LMP 11/17/2020 (Exact Date)   SpO2 99%   Visual Acuity Right Eye Distance:   Left Eye Distance:   Bilateral Distance:    Right Eye Near:   Left Eye Near:    Bilateral Near:     Physical Exam Vitals reviewed.  Constitutional:       General: She is not in acute distress.    Appearance: Normal appearance. She is not ill-appearing.  HENT:     Head: Normocephalic and atraumatic.     Right Ear: Hearing, tympanic membrane, ear canal and external ear normal. No swelling or tenderness. There is no impacted cerumen. No mastoid tenderness. Tympanic membrane is not perforated, erythematous, retracted or bulging.     Left Ear: Hearing, tympanic membrane, ear canal and external ear normal. No swelling or tenderness. There is no impacted cerumen. No mastoid tenderness. Tympanic membrane is not perforated, erythematous, retracted or bulging.     Nose:     Right Sinus: No maxillary sinus tenderness or frontal sinus tenderness.     Left Sinus: No maxillary sinus tenderness or frontal sinus tenderness.     Mouth/Throat:     Mouth: Mucous membranes are moist.     Pharynx: Uvula midline. No oropharyngeal exudate or posterior oropharyngeal erythema.     Tonsils: No tonsillar exudate.  Cardiovascular:     Rate and Rhythm: Normal rate and regular rhythm.     Heart sounds: Normal heart sounds.  Pulmonary:     Breath sounds: Normal breath sounds and air entry. No decreased breath sounds, wheezing, rhonchi or rales.     Comments: Occ cough Chest:     Chest wall: No tenderness.  Abdominal:     General: Abdomen is flat. Bowel sounds are normal.     Tenderness: There is generalized abdominal tenderness. There is no right CVA tenderness, left CVA tenderness, guarding or rebound. Negative signs include Murphy's sign, Rovsing's sign and McBurney's sign.  Comments: Generalized crampy abd pain  Lymphadenopathy:     Cervical: No cervical adenopathy.  Skin:    Capillary Refill: Capillary refill takes less than 2 seconds.     Comments: L heel with tiny .3mm puncture wound. Surrounded by exquisite tenderness and some edema. No erythema, warmth, discharge. Sensation intact. ROM intact in ankle and toes. DP 2+, cap refill <2 seconds. Neurovascularly  intact.  Neurological:     General: No focal deficit present.     Mental Status: She is alert and oriented to person, place, and time.  Psychiatric:        Attention and Perception: Attention and perception normal.        Mood and Affect: Mood and affect normal.        Behavior: Behavior normal. Behavior is cooperative.        Thought Content: Thought content normal.        Judgment: Judgment normal.      UC Treatments / Results  Labs (all labs ordered are listed, but only abnormal results are displayed) Labs Reviewed - No data to display  EKG   Radiology DG Foot Complete Left  Result Date: 11/22/2020 CLINICAL DATA:  Puncture wound to the left heel 2 days ago. Painful weight-bearing. EXAM: LEFT FOOT - COMPLETE 3+ VIEW COMPARISON:  None. FINDINGS: The mineralization and alignment are normal. There is no evidence of acute fracture or dislocation. Minimal joint space narrowing at the 1st metatarsophalangeal joint. The additional joint spaces are preserved. On one of the lateral views, a metallic BB was used to mark the puncture wound. Just posterior to this, there is a questionable linear foreign body measuring 4 mm in length. No surrounding soft tissue emphysema. No evidence of bone destruction. IMPRESSION: 1. Possible small linear foreign body within the soft tissues inferior to the calcaneal tuberosity. 2. No acute osseous findings or evidence of osteomyelitis. Electronically Signed   By: Richardean Sale M.D.   On: 11/22/2020 18:12    Procedures Procedures (including critical care time)  Medications Ordered in UC Medications  Tdap (BOOSTRIX) injection 0.5 mL (0.5 mLs Intramuscular Given 11/22/20 1838)    Initial Impression / Assessment and Plan / UC Course  I have reviewed the triage vital signs and the nursing notes.  Pertinent labs & imaging results that were available during my care of the patient were reviewed by me and considered in my medical decision making (see chart for  details).     This patient is a 45 year old female presenting with allergic rhinitis, viral gastroenteritis, foreign body in foot. Today this pt is afebrile nontachycardic nontachypneic, oxygenating well on room air, no wheezes rhonchi or rales.  Neurovascularly intact.  Xray L foot- 1. Possible small linear foreign body within the soft tissues inferior to the calcaneal tuberosity. 2. No acute osseous findings or evidence of osteomyelitis. Films interpreted by myself and radiologist.  Tdap not UTD, administered today.   Discussed options for management including referral to emergency room or follow-up with podiatry in 1 day.  Patient is adamant that she will not go to the emergency room and that she will see a podiatrist tomorrow.  Discussed that she cannot bear weight on her foot until this follow-up; crutches provided.  If she cannot see a podiatrist tomorrow, she must had to the emergency room.  She verbalizes understanding and agreement multiple times.  Rec Epsom salt baths.  For allergic rhinitis, Zyrtec sent.  For viral gastroenteritis, Imodium and Zofran sent.  Rec good  hydration, brat diet.  Strict ED return precautions discussed.  This chart was dictated using voice recognition software, Dragon. Despite the best efforts of this provider to proofread and correct errors, errors may still occur which can change documentation meaning.  Final Clinical Impressions(s) / UC Diagnoses   Final diagnoses:  Viral gastroenteritis  Seasonal allergic rhinitis due to pollen  Need for Tdap vaccination  Foreign body in left foot, initial encounter  Other polyneuropathy     Discharge Instructions     -Take the Zofran (ondansetron) up to 3 times daily for nausea and vomiting. -Take the Imodium (loperamide) up to 4 times daily for diarrhea. -For allergies/cough try Zyrtec, 1 pill daily.  Try this for at least 1 week. -You must follow-up with podiatry TOMORROW for further evaluation and  management of the possible foreign body in your foot.  Information below, call them tomorrow morning and asked to be evaluated same day. If they cannot see you tomorrow, call a different podiatrist, or head to the ED. You cannot put any weight on your foot until this evaluation; use your crutches at all times while ambulating/walking. -Do warm baths to soak your foot 1-2 times daily until this evaluation.  Try to do one tonight. -Take Tylenol 1000 mg 3 times daily, and ibuprofen 800 mg 3 times daily with food.  You can take these together, or alternate every 3-4 hours.      ED Prescriptions    Medication Sig Dispense Auth. Provider   loperamide (IMODIUM) 2 MG capsule Take 1 capsule (2 mg total) by mouth 4 (four) times daily as needed for diarrhea or loose stools. 12 capsule Marin Roberts E, PA-C   ondansetron (ZOFRAN) 8 MG tablet Take 1 tablet (8 mg total) by mouth every 8 (eight) hours as needed for nausea or vomiting. 20 tablet Hazel Sams, PA-C   cetirizine (ZYRTEC ALLERGY) 10 MG tablet Take 1 tablet (10 mg total) by mouth daily. 21 tablet Hazel Sams, PA-C     PDMP not reviewed this encounter.   Hazel Sams, PA-C 11/22/20 1911

## 2020-11-22 NOTE — Discharge Instructions (Addendum)
-  Take the Zofran (ondansetron) up to 3 times daily for nausea and vomiting. -Take the Imodium (loperamide) up to 4 times daily for diarrhea. -For allergies/cough try Zyrtec, 1 pill daily.  Try this for at least 1 week. -You must follow-up with podiatry TOMORROW for further evaluation and management of the possible foreign body in your foot.  Information below, call them tomorrow morning and asked to be evaluated same day. If they cannot see you tomorrow, call a different podiatrist, or head to the ED. You cannot put any weight on your foot until this evaluation; use your crutches at all times while ambulating/walking. -Do warm baths to soak your foot 1-2 times daily until this evaluation.  Try to do one tonight. -Take Tylenol 1000 mg 3 times daily, and ibuprofen 800 mg 3 times daily with food.  You can take these together, or alternate every 3-4 hours.

## 2021-01-06 LAB — HM PAP SMEAR

## 2022-11-04 ENCOUNTER — Emergency Department (HOSPITAL_COMMUNITY): Payer: BC Managed Care – PPO

## 2022-11-04 ENCOUNTER — Emergency Department (HOSPITAL_COMMUNITY)
Admission: EM | Admit: 2022-11-04 | Discharge: 2022-11-04 | Disposition: A | Discharge status: Home / Self Care | Payer: BC Managed Care – PPO | Attending: Emergency Medicine | Admitting: Emergency Medicine

## 2022-11-04 ENCOUNTER — Encounter (HOSPITAL_COMMUNITY): Payer: Self-pay | Admitting: Emergency Medicine

## 2022-11-04 ENCOUNTER — Other Ambulatory Visit: Payer: Self-pay

## 2022-11-04 DIAGNOSIS — S62617A Displaced fracture of proximal phalanx of left little finger, initial encounter for closed fracture: Secondary | ICD-10-CM | POA: Insufficient documentation

## 2022-11-04 DIAGNOSIS — W19XXXA Unspecified fall, initial encounter: Secondary | ICD-10-CM | POA: Insufficient documentation

## 2022-11-04 DIAGNOSIS — Z79899 Other long term (current) drug therapy: Secondary | ICD-10-CM | POA: Insufficient documentation

## 2022-11-04 DIAGNOSIS — S60947A Unspecified superficial injury of left little finger, initial encounter: Secondary | ICD-10-CM | POA: Diagnosis present

## 2022-11-04 NOTE — ED Provider Notes (Signed)
[facility] Provider Note   CSN: [id number] Arrival date & time: 11/04/22  1118     History  Chief Complaint  Patient presents with   Hand Injury    [name] is a 47 y.o. female with nonacute past medical history presents with concern for left hand pain after falling on the last week.  Patient reports swollen, difficult to straighten the left pinky finger secondary to pain.  She reports that she has been taking ibuprofen and with some relief.  She is concerned that it might be broken.   Hand Injury      Home Medications Prior to Admission medications   Medication Sig Start Date End Date Taking? Authorizing Provider  amLODipine (NORVASC) 10 MG tablet Take 1 tablet (10 mg total) by mouth daily. 11/12/17   Tereasa Coop, PA-C  benazepril-hydrochlorthiazide (LOTENSIN HCT) 20-12.5 MG tablet TAKE ONE TABLET BY MOUTH DAILY 03/02/18   Magrinat, Virgie Dad, MD  benzonatate (TESSALON) 100 MG capsule Take 2 capsules (200 mg total) by mouth 2 (two) times daily as needed for cough. 03/21/19   Montine Circle, PA-C  cetirizine (ZYRTEC ALLERGY) 10 MG tablet Take 1 tablet (10 mg total) by mouth daily. 11/22/20   Hazel Sams, PA-C  cyclobenzaprine (FLEXERIL) 10 MG tablet Take 1 tablet (10 mg total) by mouth 2 (two) times daily as needed for muscle spasms. 05/16/17   Recardo Evangelist, PA-C  goserelin (ZOLADEX) 10.8 MG injection Inject 10.8 mg into the skin every 3 (three) months. 01/18/12   Magrinat, Virgie Dad, MD  loperamide (IMODIUM) 2 MG capsule Take 1 capsule (2 mg total) by mouth 4 (four) times daily as needed for diarrhea or loose stools. 11/22/20   Hazel Sams, PA-C  naproxen sodium (ANAPROX) 220 MG tablet Take 220 mg by mouth 2 (two) times daily with a meal.    [provider]  omeprazole (PRILOSEC) 20 MG capsule Take 20 mg by mouth daily.    [provider]  ondansetron (ZOFRAN) 8 MG tablet Take 1 tablet (8 mg total) by  mouth every 8 (eight) hours as needed for nausea or vomiting. 11/22/20   Hazel Sams, PA-C  ranitidine (ZANTAC) 75 MG tablet Take 75 mg by mouth as needed. ONLY IF OUT OF PROTONIX    [provider]      Allergies    Oxycodone and Tape    Review of Systems   Review of Systems  All other systems reviewed and are negative.   Physical Exam Updated Vital Signs BP (!) 146/100 (BP Location: Right Arm)   Pulse 90   Temp 98.1 F (36.7 C) (Oral)   Resp 18   Ht 5\' 7"  (1.702 m)   Wt 109.8 kg   LMP 10/22/2022   SpO2 100%   BMI 37.90 kg/m  Physical Exam Vitals and nursing note reviewed.  Constitutional:      General: She is not in acute distress.    Appearance: Normal appearance.  HENT:     Head: Normocephalic and atraumatic.  Eyes:     General:        Right eye: No discharge.        Left eye: No discharge.  Cardiovascular:     Rate and Rhythm: Normal rate and regular rhythm.     Pulses: Normal pulses.  Pulmonary:     Effort: Pulmonary effort is normal. No respiratory distress.  Musculoskeletal:  General: No deformity.     Comments: Patient with swelling, deformity noted of left little finger, with step-off at the proximal phalanx.  She has decreased strength to flexion, extension but does have some intact strength to both.  Strength and range of motion is limited secondary to fracture and pain.  Skin:    General: Skin is warm and dry.     Capillary Refill: Capillary refill takes less than 2 seconds.  Neurological:     Mental Status: She is alert and oriented to person, place, and time.  Psychiatric:        Mood and Affect: Mood normal.        Behavior: Behavior normal.     ED Results / Procedures / Treatments   Labs (all labs ordered are listed, but only abnormal results are displayed) Labs Reviewed - No data to display  EKG None  Radiology DG Hand Complete Left  Result Date: 11/04/2022 CLINICAL DATA:  Left hand pain after fall last week. Unable  to fully extend small finger EXAM: LEFT HAND - COMPLETE 3 VIEW COMPARISON:  None Available. FINDINGS: Fracture: Transverse fracture of the small finger proximal phalangeal base with mild dorsal and ulnar displacement. Healing: None. Soft tissue: Normal. IMPRESSION: Mildly displaced transverse fracture of the small finger proximal phalangeal base. Electronically Signed   By: Darrin Nipper M.D.   On: 11/04/2022 12:03    Procedures Procedures    Medications Ordered in ED Medications - No data to display  ED Course/ Medical Decision Making/ A&P                             Medical Decision Making Amount and/or Complexity of Data Reviewed Radiology: ordered.   This patient is a 47 y.o. female who presents to the ED for concern of left pinky pain after fall on hand last week..   Differential diagnoses prior to evaluation: Acute fracture, dislocation, or other musculoskeletal injury  Past Medical History / Social History / Additional history: Chart reviewed. Pertinent results include: Uncontributory  Physical Exam: Physical exam performed. The pertinent findings include: Patient with swelling, step-off noted at the proximal phalanx of the left fifth digit.  She does have mild increased flexion, extension at the distal phalanx, limited at the MCP and middle phalanx segment secondary to pain, swelling.  Intact capillary refill, radial, ulnar pulses intact on the affected extremity.  I independently interpreted imaging including plain film radiograph of the left which shows acute transverse fracture of the left proximal phalanx. I agree with the radiologist interpretation.  Medications / Treatment: Placed in static finger splint, buddy tape, encourage orthopedic follow-up   Disposition: After consideration of the diagnostic results and the patients response to treatment, I feel that patient's evaluation today is consistent with displaced fracture proximal phalanx of the fifth little finger on the  left hand, she will need orthopedic follow-up, in the meantime buddy taping, static finger splint.   emergency department workup does not suggest an emergent condition requiring admission or immediate intervention beyond what has been performed at this time. The plan is: as above. The patient is safe for discharge and has been instructed to return immediately for worsening symptoms, change in symptoms or any other concerns.  Final Clinical Impression(s) / ED Diagnoses Final diagnoses:  Displaced fracture of proximal phalanx of left little finger, initial encounter for closed fracture    Rx / DC Orders ED Discharge Orders  None         Dorien Chihuahua 11/04/22 1231    Gareth Morgan, MD 11/06/22 [phone]

## 2022-11-04 NOTE — ED Triage Notes (Signed)
Patient c/o left hand pain after falling on it last week. States unable to straighten on pinky finger.

## 2022-11-04 NOTE — Discharge Instructions (Addendum)
Please use Tylenol or ibuprofen for pain.  You may use 600 mg ibuprofen every 6 hours or 1000 mg of Tylenol every 6 hours.  You may choose to alternate between the 2.  This would be most effective.  Not to exceed 4 g of Tylenol within 24 hours.  Not to exceed 3200 mg ibuprofen 24 hours.  Please follow up with orthopedics at your earliest convenience. 

## 2022-11-04 NOTE — Progress Notes (Signed)
Orthopedic Tech Progress Note Patient Details:  [name] 28-May-1976 [id number]  Ortho Devices Type of Ortho Device: Finger splint, Buddy tape Ortho Device/Splint Location: Left 4th and 5th digit Ortho Device/Splint Interventions: Application   Post Interventions Patient Tolerated: Well  Linus Salmons Jadon Ressler 11/04/2022, 12:40 PM

## 2022-11-11 NOTE — H&P (Signed)
[name] is an 47 y.o. female.   Chief Complaint: Left small finger proximal phalanx fracture HPI: Pt referred from ED after injury left small finger phalanx Pt seen and evaluated in office Pt here for surgery on left small finger Pt with deformity and limited mobility  Past Medical History:  Diagnosis Date   Anemia    Back pain    lumbar    Breast cancer ([place]) 2012   right   Cough 09/07/2012   Depression    no current med.   Diabetes mellitus without complication (HCC)    Gastric ulcer    GERD (gastroesophageal reflux disease)    H/O scoliosis    Headache(784.0)    hx. migraine in childhood   Hypertension    under control with med., has been on med. x 5 yr.   Peripheral neuropathy    s/p chemotherapy   S/P radiation therapy 12/19/11 - 02/03/12   Right Chest Wall and Axilla   Status post chemotherapy    finished 08/2011    Past Surgical History:  Procedure Laterality Date   ESOPHAGOGASTRODUODENOSCOPY (EGD) WITH PROPOFOL  09/06/2012   Procedure: ESOPHAGOGASTRODUODENOSCOPY (EGD) WITH PROPOFOL;  Surgeon: Milus Banister, MD;  Location: [facility];  Service: Endoscopy;  Laterality: N/A;   HIP PINNING  age 4   bilateral   MASTECTOMY W/ SENTINEL NODE BIOPSY  10/14/2011   Procedure: MASTECTOMY WITH SENTINEL LYMPH NODE BIOPSY;  Surgeon: Merrie Roof, MD;  Location: [facility];  Service: General;  Laterality: Right;   PORT-A-CATH REMOVAL  09/12/2012   Procedure: REMOVAL PORT-A-CATH;  Surgeon: Merrie Roof, MD;  Location: [facility];  Service: General;  Laterality: Left;   PORTACATH PLACEMENT  04/08/2011   WOUND EXPLORATION  10/14/2011   right mastectomy wound    Family History  Problem Relation Age of Onset   Heart disease Father        heart attack   Heart attack Father        died age 49   Hypertension Father    Hypertension Mother    Anemia Mother    Diabetes Mother    Stroke Mother    Aneurysm Mother        brain   Heart disease Maternal  Grandmother        MI   Hypertension Maternal Grandmother    Diabetes Maternal Grandmother    Hypertension Brother    Diabetes Brother    Hypertension Paternal Grandmother    Diabetes Paternal Grandmother    Social History:  reports that she has quit smoking. She has never used smokeless tobacco. She reports current alcohol use. She reports that she does not use drugs.  Allergies:  Allergies  Allergen Reactions   Oxycodone Other (See Comments)    headache   Tape Other (See Comments)    SKIN TEARS    No medications prior to admission.    No results found for this or any previous visit (from the past 48 hour(s)). No results found.  ROSNO RECENT ILLNESSES OR HOSPITALIZATIONS  Last menstrual period 10/22/2022. Physical Exam   General Appearance:  Alert, cooperative, no distress, appears stated age  Head:  Normocephalic, without obvious abnormality, atraumatic  Eyes:  Pupils equal, conjunctiva/corneas clear,         Throat: Lips, mucosa, and tongue normal; teeth and gums normal  Neck: No visible masses     Lungs:   respirations unlabored  Chest Wall:  No tenderness  or deformity  Heart:  Regular rate and rhythm,  Abdomen:   Soft, non-tender,         Extremities: LUE: SKIN INTACT, FINGERS WARM WELL PERFUSED GOOD DIGITAL MOTION TO INDEX/LONG/RING LIMITED SMALL FINGER MOTION  Pulses: 2+ and symmetric  Skin: Skin color, texture, turgor normal, no rashes or lesions     Neurologic: Normal    Assessment/Plan LEFT SMALL FINGER DISPLACED PROXIMAL PHALANX FRACTURE  LEFT SMALL FINGER OPEN REDUCTION AND INTERNAL FIXATION AND REPAIR AS INDICATED  R/B/A DISCUSSED WITH PT IN OFFICE.  PT VOICED UNDERSTANDING OF PLAN CONSENT SIGNED DAY OF SURGERY PT SEEN AND EXAMINED PRIOR TO OPERATIVE PROCEDURE/DAY OF SURGERY SITE MARKED. QUESTIONS ANSWERED WILL GO HOME FOLLOWING SURGERY   WE ARE PLANNING SURGERY FOR YOUR UPPER EXTREMITY. THE RISKS AND BENEFITS OF SURGERY INCLUDE BUT NOT  LIMITED TO BLEEDING INFECTION, DAMAGE TO NEARBY NERVES ARTERIES TENDONS, FAILURE OF SURGERY TO ACCOMPLISH ITS INTENDED GOALS, PERSISTENT SYMPTOMS AND NEED FOR FURTHER SURGICAL INTERVENTION. WITH THIS IN MIND WE WILL PROCEED. I HAVE DISCUSSED WITH THE PATIENT THE PRE AND POSTOPERATIVE REGIMEN AND THE DOS AND DON'TS. PT VOICED UNDERSTANDING AND INFORMED CONSENT SIGNED.   Linna Hoff 11/11/2022, 10:19 PM   R/B/A DISCUSSED WITH PT IN OFFICE.  PT VOICED UNDERSTANDING OF PLAN CONSENT SIGNED DAY OF SURGERY PT SEEN AND EXAMINED PRIOR TO OPERATIVE PROCEDURE/DAY OF SURGERY SITE MARKED. QUESTIONS ANSWERED WILL GO HOME FOLLOWING SURGERY

## 2022-11-12 NOTE — Progress Notes (Incomplete)
Instructions only  Patient verbally denies any shortness of breath, fever, cough and chest pain during phone call   -------------  SDW INSTRUCTIONS given:  Your procedure is scheduled on 11/14/22.  Report to [facility] Main Entrance "A" at 0530 A.M., and check in at the Admitting office.  Call this number if you have problems the morning of surgery:  [phone]   Remember:  Do not eat or drink after midnight the night before your surgery  Take these medicines the morning of surgery with A SIP OF WATER  amLODipine (NORVASC) omeprazole (PRILOSEC)   rosuvastatin (CRESTOR)   **DO NOT TAKE glipiZIDE (GLUCOTROL XL) the morning of surgery***   .** PLEASE check your blood sugar the morning of your surgery when you wake up and every 2 hours until you get to the Short Stay unit.  If your blood sugar is less than 70 mg/dL, you will need to treat for low blood sugar: Do not take insulin. Treat a low blood sugar (less than 70 mg/dL) with  cup of clear juice (cranberry or apple), 4 glucose tablets, OR glucose gel. Recheck blood sugar in 15 minutes after treatment (to make sure it is greater than 70 mg/dL). If your blood sugar is not greater than 70 mg/dL on recheck, call [phone] for further instructions.  As of today, STOP taking any Aspirin (unless otherwise instructed by your surgeon) Aleve, Naproxen, Ibuprofen, Motrin, Advil, Goody's, BC's, all herbal medications, fish oil, and all vitamins.                      Do not wear jewelry, make up, or nail polish            Do not wear lotions, powders, perfumes/colognes, or deodorant.            Do not shave 48 hours prior to surgery.  Men may shave face and neck.            Do not bring valuables to the hospital.            [facility] is not responsible for any belongings or valuables.  Do NOT Smoke (Tobacco/Vaping) 24 hours prior to your procedure If you use a CPAP at night, you may bring all equipment for your overnight stay.    Contacts, glasses, dentures or bridgework may not be worn into surgery.      For patients admitted to the hospital, discharge time will be determined by your treatment team.   Patients discharged the day of surgery will not be allowed to drive home, and someone needs to stay with them for 24 hours.    Special instructions:   [place]- Preparing For Surgery  Before surgery, you can play an important role. Because skin is not sterile, your skin needs to be as free of germs as possible. You can reduce the number of germs on your skin by washing with CHG (chlorahexidine gluconate) Soap before surgery.  CHG is an antiseptic cleaner which kills germs and bonds with the skin to continue killing germs even after washing.    Oral Hygiene is also important to reduce your risk of infection.  Remember - BRUSH YOUR TEETH THE MORNING OF SURGERY WITH YOUR REGULAR TOOTHPASTE  Please do not use if you have an allergy to CHG or antibacterial soaps. If your skin becomes reddened/irritated stop using the CHG.  Do not shave (including legs and underarms) for at least 48 hours prior to first CHG shower.  It is OK to shave your face.  Please follow these instructions carefully.   Shower the NIGHT BEFORE SURGERY and the MORNING OF SURGERY with DIAL Soap.   Pat yourself dry with a CLEAN TOWEL.  Wear CLEAN PAJAMAS to bed the night before surgery  Place CLEAN SHEETS on your bed the night of your first shower and DO NOT SLEEP WITH PETS.   Day of Surgery: Please shower morning of surgery  Wear Clean/Comfortable clothing the morning of surgery Do not apply any deodorants/lotions.   Remember to brush your teeth WITH YOUR REGULAR TOOTHPASTE.   Questions were answered. Patient verbalized understanding of instructions.

## 2022-11-13 ENCOUNTER — Encounter (HOSPITAL_COMMUNITY): Payer: Self-pay | Admitting: Orthopedic Surgery

## 2022-11-14 ENCOUNTER — Encounter (HOSPITAL_COMMUNITY): Payer: Self-pay | Admitting: Orthopedic Surgery

## 2022-11-14 ENCOUNTER — Encounter (HOSPITAL_COMMUNITY)
Admission: RE | Disposition: A | Discharge status: Home / Self Care | Payer: Self-pay | Source: Home / Self Care | Attending: Orthopedic Surgery

## 2022-11-14 ENCOUNTER — Other Ambulatory Visit: Payer: Self-pay

## 2022-11-14 ENCOUNTER — Ambulatory Visit (HOSPITAL_BASED_OUTPATIENT_CLINIC_OR_DEPARTMENT_OTHER): Payer: Self-pay | Admitting: Anesthesiology

## 2022-11-14 ENCOUNTER — Ambulatory Visit (HOSPITAL_COMMUNITY): Payer: Self-pay | Admitting: Anesthesiology

## 2022-11-14 ENCOUNTER — Ambulatory Visit (HOSPITAL_COMMUNITY)
Admission: RE | Admit: 2022-11-14 | Discharge: 2022-11-14 | Disposition: A | Discharge status: Home / Self Care | Payer: Self-pay | Attending: Orthopedic Surgery | Admitting: Orthopedic Surgery

## 2022-11-14 ENCOUNTER — Encounter: Payer: Self-pay | Admitting: Physician Assistant

## 2022-11-14 ENCOUNTER — Ambulatory Visit (HOSPITAL_COMMUNITY): Payer: Self-pay

## 2022-11-14 DIAGNOSIS — K219 Gastro-esophageal reflux disease without esophagitis: Secondary | ICD-10-CM | POA: Insufficient documentation

## 2022-11-14 DIAGNOSIS — Z87891 Personal history of nicotine dependence: Secondary | ICD-10-CM | POA: Insufficient documentation

## 2022-11-14 DIAGNOSIS — I10 Essential (primary) hypertension: Secondary | ICD-10-CM | POA: Insufficient documentation

## 2022-11-14 DIAGNOSIS — E119 Type 2 diabetes mellitus without complications: Secondary | ICD-10-CM

## 2022-11-14 DIAGNOSIS — S62617A Displaced fracture of proximal phalanx of left little finger, initial encounter for closed fracture: Secondary | ICD-10-CM | POA: Insufficient documentation

## 2022-11-14 DIAGNOSIS — Z09 Encounter for follow-up examination after completed treatment for conditions other than malignant neoplasm: Secondary | ICD-10-CM | POA: Insufficient documentation

## 2022-11-14 DIAGNOSIS — J111 Influenza due to unidentified influenza virus with other respiratory manifestations: Secondary | ICD-10-CM

## 2022-11-14 DIAGNOSIS — X58XXXA Exposure to other specified factors, initial encounter: Secondary | ICD-10-CM | POA: Insufficient documentation

## 2022-11-14 HISTORY — PX: OPEN REDUCTION INTERNAL FIXATION (ORIF) PROXIMAL PHALANX: SHX6235

## 2022-11-14 HISTORY — DX: Unspecified osteoarthritis, unspecified site: M19.90

## 2022-11-14 LAB — CBC
HCT: 34.3 % — ABNORMAL LOW (ref 36.0–46.0)
Hemoglobin: 10 g/dL — ABNORMAL LOW (ref 12.0–15.0)
MCH: 22 pg — ABNORMAL LOW (ref 26.0–34.0)
MCHC: 29.2 g/dL — ABNORMAL LOW (ref 30.0–36.0)
MCV: 75.6 fL — ABNORMAL LOW (ref 80.0–100.0)
Platelets: 397 10*3/uL (ref 150–400)
RBC: 4.54 MIL/uL (ref 3.87–5.11)
RDW: 17.9 % — ABNORMAL HIGH (ref 11.5–15.5)
WBC: 5.8 10*3/uL (ref 4.0–10.5)
nRBC: 0 % (ref 0.0–0.2)

## 2022-11-14 LAB — BASIC METABOLIC PANEL
Anion gap: 8 (ref 5–15)
BUN: 11 mg/dL (ref 6–20)
CO2: 24 mmol/L (ref 22–32)
Calcium: 8.9 mg/dL (ref 8.9–10.3)
Chloride: 107 mmol/L (ref 98–111)
Creatinine, Ser: 0.87 mg/dL (ref 0.44–1.00)
GFR, Estimated: >60 mL/min (ref >60)
Glucose, Bld: 140 mg/dL — ABNORMAL HIGH (ref 70–99)
Potassium: 3.5 mmol/L (ref 3.5–5.1)
Sodium: 139 mmol/L (ref 135–145)

## 2022-11-14 LAB — GLUCOSE, CAPILLARY
Glucose-Capillary: 148 mg/dL — ABNORMAL HIGH (ref 70–99)
Glucose-Capillary: 130 mg/dL — ABNORMAL HIGH (ref 70–99)

## 2022-11-14 LAB — POCT PREGNANCY, URINE: Preg Test, Ur: NEGATIVE

## 2022-11-14 SURGERY — OPEN REDUCTION INTERNAL FIXATION (ORIF) PROXIMAL PHALANX
Anesthesia: Monitor Anesthesia Care | Laterality: Left

## 2022-11-14 MED ORDER — ORAL CARE MOUTH RINSE: 15.0000 mL | Freq: Once | OROMUCOSAL | Status: AC

## 2022-11-14 MED ORDER — MIDAZOLAM HCL 2 MG/2ML IJ SOLN
INTRAMUSCULAR | Status: AC
  Filled 2022-11-14: qty 2

## 2022-11-14 MED ORDER — CEFAZOLIN SODIUM-DEXTROSE 2-4 GM/100ML-% IV SOLN
2.0000 g | INTRAVENOUS | Status: AC
  Administered 2022-11-14: 2 g via INTRAVENOUS
  Filled 2022-11-14: qty 100

## 2022-11-14 MED ORDER — FENTANYL CITRATE (PF) 250 MCG/5ML IJ SOLN
INTRAMUSCULAR | Status: AC
  Filled 2022-11-14: qty 5

## 2022-11-14 MED ORDER — ONDANSETRON HCL 4 MG/2ML IJ SOLN
INTRAMUSCULAR | Status: DC | PRN
  Administered 2022-11-14: 4 mg via INTRAVENOUS

## 2022-11-14 MED ORDER — ONDANSETRON HCL 4 MG/2ML IJ SOLN
INTRAMUSCULAR | Status: AC
  Filled 2022-11-14: qty 2

## 2022-11-14 MED ORDER — ROPIVACAINE HCL 5 MG/ML IJ SOLN
INTRAMUSCULAR | Status: DC | PRN
  Administered 2022-11-14: 25 mL via PERINEURAL

## 2022-11-14 MED ORDER — PROPOFOL 500 MG/50ML IV EMUL
INTRAVENOUS | Status: DC | PRN
  Administered 2022-11-14: 120 ug/kg/min via INTRAVENOUS

## 2022-11-14 MED ORDER — BUPIVACAINE HCL (PF) 0.25 % IJ SOLN
INTRAMUSCULAR | Status: AC
  Filled 2022-11-14: qty 10

## 2022-11-14 MED ORDER — FENTANYL CITRATE (PF) 100 MCG/2ML IJ SOLN: 25.0000 ug | INTRAMUSCULAR | Status: DC | PRN

## 2022-11-14 MED ORDER — HYDROCODONE-ACETAMINOPHEN 5-325 MG PO TABS: 1.0000 | ORAL_TABLET | ORAL | 0 refills | Status: AC | PRN

## 2022-11-14 MED ORDER — 0.9 % SODIUM CHLORIDE (POUR BTL) OPTIME
TOPICAL | Status: DC | PRN
  Administered 2022-11-14: 1000 mL

## 2022-11-14 MED ORDER — INSULIN ASPART 100 UNIT/ML IJ SOLN
0.0000 [IU] | INTRAMUSCULAR | Status: DC | PRN
  Filled 2022-11-14: qty 1

## 2022-11-14 MED ORDER — ACETAMINOPHEN 10 MG/ML IV SOLN: 1000.0000 mg | Freq: Once | INTRAVENOUS | Status: DC | PRN

## 2022-11-14 MED ORDER — MIDAZOLAM HCL 2 MG/2ML IJ SOLN
INTRAMUSCULAR | Status: DC | PRN
  Administered 2022-11-14: 2 mg via INTRAVENOUS

## 2022-11-14 MED ORDER — FENTANYL CITRATE (PF) 250 MCG/5ML IJ SOLN
INTRAMUSCULAR | Status: DC | PRN
  Administered 2022-11-14: 50 ug via INTRAVENOUS

## 2022-11-14 MED ORDER — PHENYLEPHRINE 80 MCG/ML (10ML) SYRINGE FOR IV PUSH (FOR BLOOD PRESSURE SUPPORT)
PREFILLED_SYRINGE | INTRAVENOUS | Status: DC | PRN
  Administered 2022-11-14: 80 ug via INTRAVENOUS

## 2022-11-14 MED ORDER — PROPOFOL 10 MG/ML IV BOLUS
INTRAVENOUS | Status: DC | PRN
  Administered 2022-11-14: 20 mg via INTRAVENOUS

## 2022-11-14 MED ORDER — LACTATED RINGERS IV SOLN: INTRAVENOUS | Status: DC

## 2022-11-14 MED ORDER — CHLORHEXIDINE GLUCONATE 0.12 % MT SOLN
15.0000 mL | Freq: Once | OROMUCOSAL | Status: AC
  Administered 2022-11-14: 15 mL via OROMUCOSAL
  Filled 2022-11-14: qty 15

## 2022-11-14 MED ORDER — PROPOFOL 10 MG/ML IV BOLUS
INTRAVENOUS | Status: AC
  Filled 2022-11-14: qty 20

## 2022-11-14 SURGICAL SUPPLY — 62 items
BAG COUNTER SPONGE SURGICOUNT (BAG) ×2 IMPLANT
BAG SPNG CNTER NS LX DISP (BAG) ×1
BNDG CMPR 5X3 KNIT ELC UNQ LF (GAUZE/BANDAGES/DRESSINGS)
BNDG CMPR 75X41 PLY ABS (GAUZE/BANDAGES/DRESSINGS) ×1
BNDG CMPR 9X4 STRL LF SNTH (GAUZE/BANDAGES/DRESSINGS) ×1
BNDG CMPR MD 5X2 ELC HKLP STRL (GAUZE/BANDAGES/DRESSINGS) ×1
BNDG COHESIVE 1X5 TAN STRL LF (GAUZE/BANDAGES/DRESSINGS) IMPLANT
BNDG ELASTIC 2 VLCR STRL LF (GAUZE/BANDAGES/DRESSINGS) IMPLANT
BNDG ELASTIC 2X5.8 VLCR STR LF (GAUZE/BANDAGES/DRESSINGS) ×2 IMPLANT
BNDG ELASTIC 3INX 5YD STR LF (GAUZE/BANDAGES/DRESSINGS) IMPLANT
BNDG ELASTIC 4X5.8 VLCR STR LF (GAUZE/BANDAGES/DRESSINGS) IMPLANT
BNDG ESMARK 4X9 LF (GAUZE/BANDAGES/DRESSINGS) ×2 IMPLANT
BNDG GAUZE DERMACEA FLUFF 4 (GAUZE/BANDAGES/DRESSINGS) IMPLANT
BNDG GZE DERMACEA 4 6PLY (GAUZE/BANDAGES/DRESSINGS)
BNDG STRETCH 4X75 NS LF (GAUZE/BANDAGES/DRESSINGS) IMPLANT
CAP PIN ORTHO PINK (CAP) IMPLANT
CAP PIN PROTECTOR ORTHO WHT (CAP) IMPLANT
CORD BIPOLAR FORCEPS 12FT (ELECTRODE) ×2 IMPLANT
COVER SURGICAL LIGHT HANDLE (MISCELLANEOUS) ×2 IMPLANT
CUFF TOURN SGL QUICK 18X4 (TOURNIQUET CUFF) ×2 IMPLANT
CUFF TOURN SGL QUICK 24 (TOURNIQUET CUFF)
CUFF TRNQT CYL 24X4X16.5-23 (TOURNIQUET CUFF) IMPLANT
DRAPE OEC MINIVIEW 54X84 (DRAPES) IMPLANT
DRAPE SURG 17X23 STRL (DRAPES) ×2 IMPLANT
DRSG ADAPTIC 3X8 NADH LF (GAUZE/BANDAGES/DRESSINGS) IMPLANT
DRSG XEROFORM 1X8 (GAUZE/BANDAGES/DRESSINGS) IMPLANT
GAUZE SPONGE 2X2 8PLY STRL LF (GAUZE/BANDAGES/DRESSINGS) IMPLANT
GAUZE SPONGE 4X4 12PLY STRL (GAUZE/BANDAGES/DRESSINGS) IMPLANT
GLOVE BIOGEL PI IND STRL 8.5 (GLOVE) ×2 IMPLANT
GLOVE SURG ORTHO 8.0 STRL STRW (GLOVE) ×2 IMPLANT
GOWN STRL REUS W/ TWL LRG LVL3 (GOWN DISPOSABLE) ×4 IMPLANT
GOWN STRL REUS W/ TWL XL LVL3 (GOWN DISPOSABLE) ×2 IMPLANT
GOWN STRL REUS W/TWL LRG LVL3 (GOWN DISPOSABLE) ×2
GOWN STRL REUS W/TWL XL LVL3 (GOWN DISPOSABLE) ×1
K-WIRE  1.6X 450L (WIRE) ×2
K-WIRE 1.6X 450L (WIRE) ×2
K-WIRE SMTH SNGL TROCAR .028X4 (WIRE)
KIT BASIN OR (CUSTOM PROCEDURE TRAY) ×2 IMPLANT
KIT TURNOVER KIT B (KITS) ×2 IMPLANT
KWIRE 1.6X 450L (WIRE) IMPLANT
KWIRE SMTH SNGL TROCAR .028X4 (WIRE) IMPLANT
MANIFOLD NEPTUNE II (INSTRUMENTS) ×2 IMPLANT
NDL HYPO 25GX1X1/2 BEV (NEEDLE) IMPLANT
NEEDLE HYPO 25GX1X1/2 BEV (NEEDLE) IMPLANT
NS IRRIG 1000ML POUR BTL (IV SOLUTION) ×2 IMPLANT
PACK ORTHO EXTREMITY (CUSTOM PROCEDURE TRAY) ×2 IMPLANT
PAD ARMBOARD 7.5X6 YLW CONV (MISCELLANEOUS) ×4 IMPLANT
PAD CAST 4YDX4 CTTN HI CHSV (CAST SUPPLIES) IMPLANT
PADDING CAST COTTON 2X4 NS (CAST SUPPLIES) IMPLANT
PADDING CAST COTTON 4X4 STRL (CAST SUPPLIES)
PADDING UNDERCAST 2X4 STRL (CAST SUPPLIES) ×2 IMPLANT
SOAP 2 % CHG 4 OZ (WOUND CARE) ×2 IMPLANT
SUCTION FRAZIER HANDLE 10FR (MISCELLANEOUS)
SUCTION TUBE FRAZIER 10FR DISP (MISCELLANEOUS) IMPLANT
SUT MERSILENE 4 0 P 3 (SUTURE) IMPLANT
SUT PROLENE 4 0 PS 2 18 (SUTURE) IMPLANT
SYR CONTROL 10ML LL (SYRINGE) IMPLANT
TOWEL GREEN STERILE (TOWEL DISPOSABLE) ×2 IMPLANT
TOWEL GREEN STERILE FF (TOWEL DISPOSABLE) ×2 IMPLANT
TUBE CONNECTING 12X1/4 (SUCTIONS) IMPLANT
UNDERPAD 30X36 HEAVY ABSORB (UNDERPADS AND DIAPERS) ×2 IMPLANT
WATER STERILE IRR 1000ML POUR (IV SOLUTION) ×2 IMPLANT

## 2022-11-14 NOTE — Transfer of Care (Signed)
Immediate Anesthesia Transfer of Care Note  Patient: [name]  Procedure(s) Performed: OPEN REDUCTION INTERNAL FIXATION (ORIF) left small finger (Left)  Patient Location: PACU  Anesthesia Type:MAC combined with regional for post-op pain  Level of Consciousness: awake, alert , and oriented  Airway & Oxygen Therapy: Patient Spontanous Breathing  Post-op Assessment: Report given to RN and Post -op Vital signs reviewed and stable  Post vital signs: Reviewed and stable  Last Vitals:  Vitals Value Taken Time  BP 115/81 11/14/22 0830  Temp    Pulse 87 11/14/22 0830  Resp 21 11/14/22 0830  SpO2 98 % 11/14/22 0830  Vitals shown include unvalidated device data.  Last Pain:  Vitals:   11/14/22 0620  TempSrc:   PainSc: 2          Complications: No notable events documented.

## 2022-11-14 NOTE — Anesthesia Procedure Notes (Signed)
Anesthesia Regional Block: Supraclavicular block   Pre-Anesthetic Checklist: , timeout performed,  Correct Patient, Correct Site, Correct Laterality,  Correct Procedure, Correct Position, site marked,  Risks and benefits discussed,  Surgical consent,  Pre-op evaluation,  At surgeon's request and post-op pain management  Laterality: Left  Prep: Dura Prep       Needles:  Injection technique: Single-shot  Needle Type: Echogenic Stimulator Needle     Needle Length: 5cm  Needle Gauge: 20     Additional Needles:   Procedures:,,,, ultrasound used (permanent image in chart),,    Narrative:  Start time: 11/14/2022 7:10 AM End time: 11/14/2022 7:15 AM Injection made incrementally with aspirations every 5 mL.  Performed by: Personally  Anesthesiologist: Darral Dash, DO  Additional Notes: Patient identified. Risks/Benefits/Options discussed with patient including but not limited to bleeding, infection, nerve damage, failed block, incomplete pain control. Patient expressed understanding and wished to proceed. All questions were answered. Sterile technique was used throughout the entire procedure. Please see nursing notes for vital signs. Aspirated in 5cc intervals with injection for negative confirmation. Patient was given instructions on fall risk and not to get out of bed. All questions and concerns addressed with instructions to call with any issues or inadequate analgesia.

## 2022-11-14 NOTE — Op Note (Signed)
PREOPERATIVE DIAGNOSIS: Left small finger displaced proximal phalanx fracture  POSTOPERATIVE DIAGNOSIS: Same  ATTENDING SURGEON: Dr. Iran Planas who scrubbed and present for entire procedure  ASSISTANT SURGEON: None  ANESTHESIA: Regional with IV sedation  OPERATIVE PROCEDURE: Open treatment of left small finger displaced proximal phalanx fracture requiring internal fixation Radiographs 3 views left small finger  IMPLANTS: Two 0.045 K wires  EBL: Minimal  RADIOGRAPHIC INTERPRETATION: AP lateral oblique views of the finger do show the cross K wire fixation in place with good alignment of the proximal phalangeal base  SURGICAL INDICATIONS: Patient is a right-hand-dominant female who sustained a closed injury to her left small finger several weeks ago.  Patient seen evaluate in the office after being referred from the ER.  Patient elected undergo the above procedure.  The risks of surgery include but not limited to bleeding infection damage nearby nerves arteries or tendons loss of motion of the wrist and digits incomplete relief of symptoms and need for further surgical invention.  Signed informed consent was obtained on the day of surgery.  SURGICAL TECHNIQUE: The patient was prepped identified in the preoperative holding area marked apart a marker of the left small finger indicate correct operative site.  Patient brought back to operating placed supine on the anesthesia table where the regional anesthetic was administered.  Patient tolerates well.  Well-padded tourniquet was then placed on the left brachium and sealed with the appropriate drape.  Left upper extremities then prepped and draped normal sterile fashion.  Timeout was called the correct site was identified the procedure then began.  Attention then turned to the left small finger.  Longitudinal incision made directly over the small finger proximal phalanx.  Dissection was then carried down through the skin and subcutaneous tissue.   Extensor mechanism was incised longitudinally exposing the fracture site.  Taken down was the early fracture callus and the fracture site was then exposed.  The patient did have the very proximal fracture.  Following this takedown of the fracture callus was completed and then open reduction was then performed.  Following this two 0.045 K wire was then brought placed in cross K wire fashion across the fracture site with good purchase.  The K wires were then cut and bent left out of the skin.  Final radiographs were then obtained.  Xeroform bolster dressing was then applied around the pin sites.  The periosteum and capsular layer was then closed with Monocryl.  The extensor mechanism closed with 4-0 Ethibond suture.  Skin closed with horizontal mattress Prolene sutures.  Adaptic dressing sterile compressive bandaging applied.  Patient was placed in a well-padded hand intrinsic safe position including all fingers.  Patient tolerated the procedure well returned to recovery in good condition.  POSTOPERATIVE PLAN: Patient is charged home.  See her back in the office in 11 days for pin check wound check sutures out down to see our therapist for ulnar gutter hand-based splint.  Begin working on gentle range of motion with the pins in place.  See her back at the 4-week mark for radiographs and likely K wire removal.  Radiographs at each visit.

## 2022-11-14 NOTE — Discharge Instructions (Signed)
KEEP BANDAGE CLEAN AND DRY °CALL OFFICE FOR F/U APPT [phone] in 11 days °KEEP HAND ELEVATED ABOVE HEART °OK TO APPLY ICE TO OPERATIVE AREA °CONTACT OFFICE IF ANY WORSENING PAIN OR CONCERNS.  °

## 2022-11-14 NOTE — Anesthesia Procedure Notes (Signed)
Procedure Name: MAC Date/Time: 11/14/2022 7:32 AM  Performed by: Inda Coke, CRNAPre-anesthesia Checklist: Patient identified, Emergency Drugs available, Suction available, Timeout performed and Patient being monitored Patient Re-evaluated:Patient Re-evaluated prior to induction Oxygen Delivery Method: Simple face mask Induction Type: IV induction Dental Injury: Teeth and Oropharynx as per pre-operative assessment

## 2022-11-14 NOTE — Anesthesia Preprocedure Evaluation (Addendum)
Anesthesia Evaluation  Patient identified by MRN, date of birth, ID band Patient awake    Reviewed: Allergy & Precautions, NPO status , Patient's Chart, lab work & pertinent test results  Airway Mallampati: II  TM Distance: >3 FB Neck ROM: Full    Dental no notable dental hx.    Pulmonary former smoker   Pulmonary exam normal        Cardiovascular hypertension,  Rhythm:Regular Rate:Normal     Neuro/Psych  Headaches   Depression       GI/Hepatic Neg liver ROS, PUD,GERD  ,,  Endo/Other  diabetes    Renal/GU negative Renal ROS  negative genitourinary   Musculoskeletal  (+) Arthritis ,  Finger fracture    Abdominal Normal abdominal exam  (+)   Peds  Hematology  (+) Blood dyscrasia, anemia   Anesthesia Other Findings   Reproductive/Obstetrics                             Anesthesia Physical Anesthesia Plan  ASA: 3  Anesthesia Plan: MAC and Regional   Post-op Pain Management: Regional block*   Induction: Intravenous  PONV Risk Score and Plan: 2 and Ondansetron, Dexamethasone, Propofol infusion, Midazolam and Treatment may vary due to age or medical condition  Airway Management Planned: Simple Face Mask and Nasal Cannula  Additional Equipment: None  Intra-op Plan:   Post-operative Plan:   Informed Consent: I have reviewed the patients History and Physical, chart, labs and discussed the procedure including the risks, benefits and alternatives for the proposed anesthesia with the patient or authorized representative who has indicated his/her understanding and acceptance.     Dental advisory given  Plan Discussed with: CRNA  Anesthesia Plan Comments:        Anesthesia Quick Evaluation

## 2022-11-14 NOTE — Anesthesia Postprocedure Evaluation (Signed)
Anesthesia Post Note  Patient: [name]  Procedure(s) Performed: OPEN REDUCTION INTERNAL FIXATION (ORIF) left small finger (Left)     Patient location during evaluation: PACU Anesthesia Type: Regional and MAC Level of consciousness: awake and alert Pain management: pain level controlled Vital Signs Assessment: post-procedure vital signs reviewed and stable Respiratory status: spontaneous breathing, nonlabored ventilation, respiratory function stable and patient connected to nasal cannula oxygen Cardiovascular status: stable and blood pressure returned to baseline Postop Assessment: no apparent nausea or vomiting Anesthetic complications: no   No notable events documented.  Last Vitals:  Vitals:   11/14/22 0845 11/14/22 0900  BP: (!) 129/93   Pulse: 81 77  Resp: 14 16  Temp:  36.7 C  SpO2: 100% 96%    Last Pain:  Vitals:   11/14/22 0845  TempSrc:   PainSc: 0-No pain                 Belenda Cruise P Keldrick Pomplun

## 2022-11-15 ENCOUNTER — Encounter (HOSPITAL_COMMUNITY): Payer: Self-pay | Admitting: Orthopedic Surgery

## 2022-11-19 ENCOUNTER — Encounter (HOSPITAL_COMMUNITY): Payer: Self-pay

## 2022-11-19 ENCOUNTER — Emergency Department (HOSPITAL_COMMUNITY): Payer: Self-pay

## 2022-11-19 ENCOUNTER — Emergency Department (HOSPITAL_COMMUNITY)
Admission: EM | Admit: 2022-11-19 | Discharge: 2022-11-19 | Discharge status: Against Medical Advice | Payer: Self-pay | Attending: Emergency Medicine | Admitting: Emergency Medicine

## 2022-11-19 DIAGNOSIS — R0602 Shortness of breath: Secondary | ICD-10-CM | POA: Insufficient documentation

## 2022-11-19 DIAGNOSIS — F419 Anxiety disorder, unspecified: Secondary | ICD-10-CM | POA: Insufficient documentation

## 2022-11-19 DIAGNOSIS — Z5321 Procedure and treatment not carried out due to patient leaving prior to being seen by health care provider: Secondary | ICD-10-CM | POA: Insufficient documentation

## 2022-11-19 DIAGNOSIS — R42 Dizziness and giddiness: Secondary | ICD-10-CM | POA: Insufficient documentation

## 2022-11-19 NOTE — ED Notes (Signed)
Pt called for 3rd time, not seen in [place] lobby, pt to be taken out

## 2022-11-19 NOTE — ED Triage Notes (Addendum)
Pt arrived POV for medication concern reports had finger surgery on 3/25, was prescribe hydrocodone, advised to take one tab Q4 hrs, provider has increased dose to 2 tabs Q4hrs, pt started dosing tonight and is concern for OD and the increase dose made her anxious, felt dizzy, flushed, and SOB, which has subsided. Otherwise not further concern, pt reports unsure if dosing is correct to take. Pt A&O x4  NAD noted, pt has infant child with her.

## 2022-11-19 NOTE — ED Notes (Signed)
Pt called from [place] to treatment room 15, no answer from pt, pt and infant not seen in [place] lobby as well.

## 2022-11-19 NOTE — ED Notes (Signed)
Pt called 2nd time for treatment room, pt still not seen in [place] lobby, pt did not inform staff that she would be leaving.

## 2022-11-25 DIAGNOSIS — S62617D Displaced fracture of proximal phalanx of left little finger, subsequent encounter for fracture with routine healing: Secondary | ICD-10-CM | POA: Diagnosis not present

## 2022-11-25 DIAGNOSIS — S62617A Displaced fracture of proximal phalanx of left little finger, initial encounter for closed fracture: Secondary | ICD-10-CM | POA: Diagnosis not present

## 2022-11-25 DIAGNOSIS — M79642 Pain in left hand: Secondary | ICD-10-CM | POA: Diagnosis not present

## 2022-11-29 DIAGNOSIS — M79642 Pain in left hand: Secondary | ICD-10-CM | POA: Diagnosis not present

## 2022-12-16 DIAGNOSIS — S62617A Displaced fracture of proximal phalanx of left little finger, initial encounter for closed fracture: Secondary | ICD-10-CM | POA: Diagnosis not present

## 2023-01-06 DIAGNOSIS — S62617D Displaced fracture of proximal phalanx of left little finger, subsequent encounter for fracture with routine healing: Secondary | ICD-10-CM | POA: Diagnosis not present

## 2023-02-03 DIAGNOSIS — S62617D Displaced fracture of proximal phalanx of left little finger, subsequent encounter for fracture with routine healing: Secondary | ICD-10-CM | POA: Diagnosis not present

## 2023-08-04 ENCOUNTER — Ambulatory Visit
Admission: EM | Admit: 2023-08-04 | Discharge: 2023-08-04 | Disposition: A | Discharge status: Home / Self Care | Payer: 59 | Attending: Nurse Practitioner | Admitting: Nurse Practitioner

## 2023-08-04 ENCOUNTER — Encounter: Payer: Self-pay | Admitting: Physician Assistant

## 2023-08-04 DIAGNOSIS — E118 Type 2 diabetes mellitus with unspecified complications: Secondary | ICD-10-CM

## 2023-08-04 DIAGNOSIS — I10 Essential (primary) hypertension: Secondary | ICD-10-CM

## 2023-08-04 LAB — POCT FASTING CBG KUC MANUAL ENTRY: POCT Glucose (KUC): 201 mg/dL — AB (ref 70–99)

## 2023-08-04 MED ORDER — AMLODIPINE BESYLATE 10 MG PO TABS: 10.0000 mg | ORAL_TABLET | Freq: Every day | ORAL | 0 refills | Status: DC

## 2023-08-04 MED ORDER — GLIPIZIDE ER 5 MG PO TB24: 5.0000 mg | ORAL_TABLET | Freq: Every morning | ORAL | 0 refills | Status: DC

## 2023-08-04 MED ORDER — ROSUVASTATIN CALCIUM 10 MG PO TABS: 10.0000 mg | ORAL_TABLET | Freq: Every morning | ORAL | 0 refills | Status: DC

## 2023-08-04 MED ORDER — BENAZEPRIL-HYDROCHLOROTHIAZIDE 20-12.5 MG PO TABS: 1.0000 | ORAL_TABLET | Freq: Every day | ORAL | 0 refills | Status: DC

## 2023-08-04 NOTE — Discharge Instructions (Addendum)
You have hypertension and diabetes.Your CBG is 201.  You will comprehensive metabolic panel is pending.  Your amlodipine 10 mg along with Benazepril hydrochlorothiazide 20/25 mg and glipizide 5 mg have been refilled along with rosuvastatin 10 mg, 30-day supply.  Encouraged to follow-up with your primary care provider in the next 3 weeks to ensure that you do not run out of your medication.

## 2023-08-04 NOTE — ED Triage Notes (Addendum)
Patient has hx of hypertension. Patient is out of her BP meds. Hasn't taken in 3 months Headache with nausea x 2 weeks.

## 2023-08-04 NOTE — ED Provider Notes (Signed)
[facility]    CSN: [id number] Arrival date & time: 08/04/23  1000      History   Chief Complaint Chief Complaint  Patient presents with   Nausea   Headache    HPI [name] is a 47 y.o. female.   HPI  She is in today for evaluation of her headaches and nausea.  She reports that she has history of hypertension and diabetes.  She has been off her medications for the last 3 months.  She reports she has tried holistic measures because she does not want to take the medication.  She has made some lifestyle changes but endorses that she has not been strict.  She reports that she tries to minimize the added sugar in her diet.  She continues to work on lowering her sodium intake.  She denies nose use, chest pains or shortness of breath.  She reports that she does have a family history of hypertension and diabetes.  She reports that her mother had a stroke. Past Medical History:  Diagnosis Date   Anemia    Arthritis    Back pain    lumbar    Breast cancer (HCC) 08/22/2010   right   Cough 09/07/2012   Depression    no current med.   Diabetes mellitus without complication (HCC)    Gastric ulcer    GERD (gastroesophageal reflux disease)    H/O scoliosis    Headache(784.0)    hx. migraine in childhood   Hypertension    under control with med., has been on med. x 5 yr.   Peripheral neuropathy    s/p chemotherapy   S/P radiation therapy 12/19/11 - 02/03/12   Right Chest Wall and Axilla   Status post chemotherapy    finished 08/2011    Patient Active Problem List   Diagnosis Date Noted   Influenza-like illness 09/29/2016   Diabetes mellitus type II, non insulin dependent (HCC) 10/05/2015   Obesity, morbid, BMI 40.0-49.9 (HCC) 10/05/2015   Obesity 05/23/2014   Diabetes type 2, uncontrolled 05/23/2014   Malignant neoplasm of upper-outer quadrant of right breast in female, estrogen receptor positive (HCC) 09/09/2013   History of DVT (deep vein thrombosis) 01/09/2012    Peripheral neuropathy    Iron deficiency anemia 06/29/2011   Hypertension 02/28/2011    Past Surgical History:  Procedure Laterality Date   ESOPHAGOGASTRODUODENOSCOPY (EGD) WITH PROPOFOL  09/06/2012   Procedure: ESOPHAGOGASTRODUODENOSCOPY (EGD) WITH PROPOFOL;  Surgeon: Rachael Fee, MD;  Location: [facility];  Service: Endoscopy;  Laterality: N/A;   HIP PINNING  age 39   bilateral   MASTECTOMY W/ SENTINEL NODE BIOPSY  10/14/2011   Procedure: MASTECTOMY WITH SENTINEL LYMPH NODE BIOPSY;  Surgeon: Robyne Askew, MD;  Location: [facility];  Service: General;  Laterality: Right;   OPEN REDUCTION INTERNAL FIXATION (ORIF) PROXIMAL PHALANX Left 11/14/2022   Procedure: OPEN REDUCTION INTERNAL FIXATION (ORIF) left small finger;  Surgeon: Bradly Bienenstock, MD;  Location: [facility];  Service: Orthopedics;  Laterality: Left;  regional with iv sedation   PORT-A-CATH REMOVAL  09/12/2012   Procedure: REMOVAL PORT-A-CATH;  Surgeon: Robyne Askew, MD;  Location: [facility];  Service: General;  Laterality: Left;   PORTACATH PLACEMENT  04/08/2011   WOUND EXPLORATION  10/14/2011   right mastectomy wound    OB History     Gravida  5   Para  2   Term  1   Preterm  1  AB  3   Living  2      SAB  3   IAB      Ectopic      Multiple      Live Births  2        Obstetric Comments  Menarche age 89 GX P2  First pregnancy to term age 72 1 miscarriage No birth control pills          Home Medications    Prior to Admission medications   Medication Sig Start Date End Date Taking? Authorizing Provider  ibuprofen (ADVIL) 200 MG tablet Take 600 mg by mouth every 8 (eight) hours as needed (pain.).   Yes [provider]  omeprazole (PRILOSEC) 20 MG capsule Take 20 mg by mouth in the morning.   Yes [provider]  amLODipine (NORVASC) 10 MG tablet Take 1 tablet (10 mg total) by mouth daily. 08/04/23   Barbette Merino, NP  benazepril-hydrochlorthiazide (LOTENSIN  HCT) 20-12.5 MG tablet Take 1 tablet by mouth daily. 08/04/23   Barbette Merino, NP  glipiZIDE (GLUCOTROL XL) 5 MG 24 hr tablet Take 1 tablet (5 mg total) by mouth in the morning. 08/04/23   Barbette Merino, NP  rosuvastatin (CRESTOR) 10 MG tablet Take 1 tablet (10 mg total) by mouth in the morning. 08/04/23   Barbette Merino, NP    Family History Family History  Problem Relation Age of Onset   Heart disease Father        heart attack   Heart attack Father        died age 69   Hypertension Father    Hypertension Mother    Anemia Mother    Diabetes Mother    Stroke Mother    Aneurysm Mother        brain   Heart disease Maternal Grandmother        MI   Hypertension Maternal Grandmother    Diabetes Maternal Grandmother    Hypertension Brother    Diabetes Brother    Hypertension Paternal Grandmother    Diabetes Paternal Grandmother     Social History Social History   Tobacco Use   Smoking status: Former   Smokeless tobacco: Never   Tobacco comments:    quit 1998 - states only smoked for 1 month  Vaping Use   Vaping status: Never Used  Substance Use Topics   Alcohol use: Yes    Comment: rarely   Drug use: No     Allergies   Oxycodone and Tape   Review of Systems Review of Systems   Physical Exam Triage Vital Signs ED Triage Vitals  Encounter Vitals Group     BP 08/04/23 1013 (!) 187/109     Systolic BP Percentile --      Diastolic BP Percentile --      Pulse Rate 08/04/23 1013 82     Resp 08/04/23 1013 17     Temp 08/04/23 1013 99 F (37.2 C)     Temp Source 08/04/23 1013 Oral     SpO2 08/04/23 1013 98 %     Weight --      Height --      Head Circumference --      Peak Flow --      Pain Score 08/04/23 1011 4     Pain Loc --      Pain Education --      Exclude from Growth Chart --    No  data found.  Updated Vital Signs BP (!) 187/109 (BP Location: Right Wrist)   Pulse 82   Temp 99 F (37.2 C) (Oral)   Resp 17   LMP 07/19/2023 (Exact Date)    SpO2 98%   Visual Acuity Right Eye Distance:   Left Eye Distance:   Bilateral Distance:    Right Eye Near:   Left Eye Near:    Bilateral Near:     Physical Exam Constitutional:      Appearance: She is obese.  HENT:     Head: Normocephalic and atraumatic.  Cardiovascular:     Rate and Rhythm: Normal rate and regular rhythm.     Heart sounds: Normal heart sounds.  Pulmonary:     Effort: Pulmonary effort is normal.     Breath sounds: Normal breath sounds.  Neurological:     Mental Status: She is alert.     GCS: GCS eye subscore is 4. GCS verbal subscore is 5. GCS motor subscore is 6.      UC Treatments / Results  Labs (all labs ordered are listed, but only abnormal results are displayed) Labs Reviewed  POCT FASTING CBG [facility] MANUAL ENTRY - Abnormal; Notable for the following components:      Result Value   POCT Glucose ([facility]) 201 (*)    All other components within normal limits  COMPREHENSIVE METABOLIC PANEL  HEMOGLOBIN A1C    EKG   Radiology No results found.  Procedures Procedures (including critical care time)  Medications Ordered in UC Medications - No data to display  Initial Impression / Assessment and Plan / UC Course  I have reviewed the triage vital signs and the nursing notes.  Pertinent labs & imaging results that were available during my care of the patient were reviewed by me and considered in my medical decision making (see chart for details).     Headaches and nausea Final Clinical Impressions(s) / UC Diagnoses   Final diagnoses:  Hypertension, unspecified type  DM type 2, controlled, with complication [facility])     Discharge Instructions      You have hypertension and diabetes.Your CBG is 201.  You will comprehensive metabolic panel is pending.  Your amlodipine 10 mg along with Benazepril hydrochlorothiazide 20/25 mg and glipizide 5 mg have been refilled along with rosuvastatin 10 mg, 30-day supply.  Encouraged to follow-up with your  primary care provider in the next 3 weeks to ensure that you do not run out of your medication.     ED Prescriptions     Medication Sig Dispense Auth. Provider   amLODipine (NORVASC) 10 MG tablet  (Status: Discontinued) Take 1 tablet (10 mg total) by mouth daily. 30 tablet Thad Ranger M, NP   benazepril-hydrochlorthiazide (LOTENSIN HCT) 20-12.5 MG tablet Take 1 tablet by mouth daily. 30 tablet Thad Ranger M, NP   glipiZIDE (GLUCOTROL XL) 5 MG 24 hr tablet Take 1 tablet (5 mg total) by mouth in the morning. 30 tablet Thad Ranger M, NP   rosuvastatin (CRESTOR) 10 MG tablet Take 1 tablet (10 mg total) by mouth in the morning. 30 tablet Thad Ranger M, NP   amLODipine (NORVASC) 10 MG tablet  (Status: Discontinued) Take 1 tablet (10 mg total) by mouth daily. 30 tablet Thad Ranger M, NP   amLODipine (NORVASC) 10 MG tablet Take 1 tablet (10 mg total) by mouth daily. 30 tablet Barbette Merino, NP      PDMP not reviewed this encounter.   Thad Ranger [place],  NP 08/04/23 1125

## 2023-08-05 LAB — HEMOGLOBIN A1C
Est. average glucose Bld gHb Est-mCnc: 369 mg/dL
Hgb A1c MFr Bld: 14.5 % — ABNORMAL HIGH (ref 4.8–5.6)

## 2023-08-05 LAB — COMPREHENSIVE METABOLIC PANEL
ALT: 11 [IU]/L (ref 0–32)
AST: 14 [IU]/L (ref 0–40)
Albumin: 3.8 g/dL — ABNORMAL LOW (ref 3.9–4.9)
Alkaline Phosphatase: 113 [IU]/L (ref 44–121)
BUN/Creatinine Ratio: 12 (ref 9–23)
BUN: 8 mg/dL (ref 6–24)
Bilirubin Total: 0.4 mg/dL (ref 0.0–1.2)
CO2: 25 mmol/L (ref 20–29)
Calcium: 8.9 mg/dL (ref 8.7–10.2)
Chloride: 101 mmol/L (ref 96–106)
Creatinine, Ser: 0.65 mg/dL (ref 0.57–1.00)
Globulin, Total: 3 g/dL (ref 1.5–4.5)
Glucose: 204 mg/dL — ABNORMAL HIGH (ref 70–99)
Potassium: 4 mmol/L (ref 3.5–5.2)
Sodium: 140 mmol/L (ref 134–144)
Total Protein: 6.8 g/dL (ref 6.0–8.5)
eGFR: 109 mL/min/{1.73_m2} (ref >59)

## 2023-09-30 ENCOUNTER — Other Ambulatory Visit: Payer: Self-pay | Admitting: Nurse Practitioner

## 2023-10-07 ENCOUNTER — Ambulatory Visit
Admission: EM | Admit: 2023-10-07 | Discharge: 2023-10-07 | Disposition: A | Discharge status: Home / Self Care | Payer: 59 | Attending: Family Medicine | Admitting: Family Medicine

## 2023-10-07 DIAGNOSIS — N760 Acute vaginitis: Secondary | ICD-10-CM

## 2023-10-07 DIAGNOSIS — B370 Candidal stomatitis: Secondary | ICD-10-CM | POA: Diagnosis not present

## 2023-10-07 DIAGNOSIS — E1165 Type 2 diabetes mellitus with hyperglycemia: Secondary | ICD-10-CM | POA: Diagnosis not present

## 2023-10-07 MED ORDER — GLIPIZIDE 5 MG PO TABS: 5.0000 mg | ORAL_TABLET | Freq: Two times a day (BID) | ORAL | 1 refills | Status: DC

## 2023-10-07 MED ORDER — FLUCONAZOLE 150 MG PO TABS: 150.0000 mg | ORAL_TABLET | ORAL | 0 refills | Status: DC

## 2023-10-07 NOTE — Discharge Instructions (Addendum)
For diabetes or elevated blood sugar, please make sure you are limiting and avoiding starchy, carbohydrate foods like pasta, breads, sweet breads, pastry, rice, potatoes, desserts. These foods can elevate your blood sugar. Also, limit and avoid drinks that contain a lot of sugar such as sodas, sweet teas, fruit juices.  Drinking plain water will be much more helpful, try 64 ounces of water daily.  It is okay to flavor your water naturally by cutting cucumber, lemon, mint or lime, placing it in a picture with water and drinking it over a period of 24-48 hours as long as it remains refrigerated. ? ?For elevated blood pressure, make sure you are monitoring salt in your diet.  Do not eat restaurant foods and limit processed foods at home. I highly recommend you prepare and cook your own foods at home.  Processed foods include things like frozen meals, pre-seasoned meats and dinners, deli meats, canned foods as these foods contain a high amount of sodium/salt.  Make sure you are paying attention to sodium labels on foods you buy at the grocery store. Buy your spices separately such as garlic powder, onion powder, cumin, cayenne, parsley flakes so that you can avoid seasonings that contain salt. However, salt-free seasonings are available and can be used, an example is Mrs. Dash and includes a lot of different mixtures that do not contain salt. ? ?Lastly, when cooking using oils that are healthier for you is important. This includes olive oil, avocado oil, canola oil. We have discussed a lot of foods to avoid but below is a list of foods that can be very healthy to use in your diet whether it is for diabetes, cholesterol, high blood pressure, or in general healthy eating. ? ?Salads - kale, spinach, cabbage, spring mix, arugula ?Fruits - avocadoes, berries (blueberries, raspberries, blackberries), apples, oranges, pomegranate, grapefruit, kiwi ?Vegetables - asparagus, cauliflower, broccoli, green beans, brussel sprouts,  bell peppers, beets; stay away from or limit starchy vegetables like potatoes, carrots, peas ?Other general foods - kidney beans, egg whites, almonds, walnuts, sunflower seeds, pumpkin seeds, fat free yogurt, almond milk, flax seeds, quinoa, oats  ?Meat - It is better to eat lean meats and limit your red meat including pork to once a week.  Wild caught fish, chicken breast are good options as they tend to be leaner sources of good protein. Still be mindful of the sodium labels for the meats you buy. ? ?DO NOT EAT ANY FOODS ON THIS LIST THAT YOU ARE ALLERGIC TO. For more specific needs, I highly recommend consulting a dietician or nutritionist but this can definitely be a good starting point. ? ?

## 2023-10-07 NOTE — ED Triage Notes (Signed)
Pt reports vaginal itching x 4 days.   Pt reports burning sensation in tongue x 3 days.

## 2023-10-07 NOTE — ED Provider Notes (Signed)
[facility]  Note:  This document was prepared using Conservation officer, historic buildings and may include unintentional dictation errors.  MRN: [id number] DOB: [birth year]  Subjective:   [name] is a 48 y.o. female presenting for 4-day history of vaginal itching, vaginal discharge, discomfort of her tongue.  Patient is a severely uncontrolled diabetic.  Is not compliant with regular medical care, diabetic friendly foods.  She is not currently taking her glipizide.  Ran out of the medication and has not set up PCP care.  Is not willing to consider insulin or metformin.   No current facility-administered medications for this encounter.  Current Outpatient Medications:    amLODipine (NORVASC) 10 MG tablet, Take 1 tablet (10 mg total) by mouth daily., Disp: 30 tablet, Rfl: 0   benazepril-hydrochlorthiazide (LOTENSIN HCT) 20-12.5 MG tablet, Take 1 tablet by mouth daily., Disp: 30 tablet, Rfl: 0   glipiZIDE (GLUCOTROL XL) 5 MG 24 hr tablet, Take 1 tablet (5 mg total) by mouth in the morning., Disp: 30 tablet, Rfl: 0   ibuprofen (ADVIL) 200 MG tablet, Take 600 mg by mouth every 8 (eight) hours as needed (pain.)., Disp: , Rfl:    omeprazole (PRILOSEC) 20 MG capsule, Take 20 mg by mouth in the morning., Disp: , Rfl:    rosuvastatin (CRESTOR) 10 MG tablet, Take 1 tablet (10 mg total) by mouth in the morning., Disp: 30 tablet, Rfl: 0   Allergies  Allergen Reactions   Oxycodone Other (See Comments)    headache   Tape Other (See Comments)    SKIN TEARS    Past Medical History:  Diagnosis Date   Anemia    Arthritis    Back pain    lumbar    Breast cancer (HCC) 08/22/2010   right   Cough 09/07/2012   Depression    no current med.   Diabetes mellitus without complication (HCC)    Gastric ulcer    GERD (gastroesophageal reflux disease)    H/O scoliosis    Headache(784.0)    hx. migraine in childhood   Hypertension    under control with med., has been on med. x  5 yr.   Peripheral neuropathy    s/p chemotherapy   S/P radiation therapy 12/19/11 - 02/03/12   Right Chest Wall and Axilla   Status post chemotherapy    finished 08/2011     Past Surgical History:  Procedure Laterality Date   ESOPHAGOGASTRODUODENOSCOPY (EGD) WITH PROPOFOL  09/06/2012   Procedure: ESOPHAGOGASTRODUODENOSCOPY (EGD) WITH PROPOFOL;  Surgeon: Rachael Fee, MD;  Location: [facility];  Service: Endoscopy;  Laterality: N/A;   HIP PINNING  age 58   bilateral   MASTECTOMY W/ SENTINEL NODE BIOPSY  10/14/2011   Procedure: MASTECTOMY WITH SENTINEL LYMPH NODE BIOPSY;  Surgeon: Robyne Askew, MD;  Location: [facility];  Service: General;  Laterality: Right;   OPEN REDUCTION INTERNAL FIXATION (ORIF) PROXIMAL PHALANX Left 11/14/2022   Procedure: OPEN REDUCTION INTERNAL FIXATION (ORIF) left small finger;  Surgeon: Bradly Bienenstock, MD;  Location: [facility];  Service: Orthopedics;  Laterality: Left;  regional with iv sedation   PORT-A-CATH REMOVAL  09/12/2012   Procedure: REMOVAL PORT-A-CATH;  Surgeon: Robyne Askew, MD;  Location: [facility];  Service: General;  Laterality: Left;   PORTACATH PLACEMENT  04/08/2011   WOUND EXPLORATION  10/14/2011   right mastectomy wound    Family History  Problem Relation Age of Onset   Heart disease  Father        heart attack   Heart attack Father        died age 9   Hypertension Father    Hypertension Mother    Anemia Mother    Diabetes Mother    Stroke Mother    Aneurysm Mother        brain   Heart disease Maternal Grandmother        MI   Hypertension Maternal Grandmother    Diabetes Maternal Grandmother    Hypertension Brother    Diabetes Brother    Hypertension Paternal Grandmother    Diabetes Paternal Grandmother     Social History   Tobacco Use   Smoking status: Former   Smokeless tobacco: Never   Tobacco comments:    quit 1998 - states only smoked for 1 month  Vaping Use   Vaping status: Never Used  Substance Use  Topics   Alcohol use: Yes    Comment: rarely   Drug use: No    ROS   Objective:   Vitals: BP (!) 157/76 (BP Location: Right Arm)   Pulse (!) 109   Temp 98.2 F (36.8 C) (Oral)   Resp 18   SpO2 98%   Physical Exam Constitutional:      General: She is not in acute distress.    Appearance: Normal appearance. She is well-developed. She is not ill-appearing, toxic-appearing or diaphoretic.  HENT:     Head: Normocephalic and atraumatic.     Nose: Nose normal.     Mouth/Throat:     Mouth: Mucous membranes are moist.  Eyes:     General: No scleral icterus.       Right eye: No discharge.        Left eye: No discharge.     Extraocular Movements: Extraocular movements intact.  Cardiovascular:     Rate and Rhythm: Normal rate.  Pulmonary:     Effort: Pulmonary effort is normal.  Skin:    General: Skin is warm and dry.  Neurological:     General: No focal deficit present.     Mental Status: She is alert and oriented to person, place, and time.  Psychiatric:        Mood and Affect: Mood normal.        Behavior: Behavior normal.     Assessment and Plan :   PDMP not reviewed this encounter.  1. Acute vaginitis   2. Oral thrush   3. Uncontrolled type 2 diabetes mellitus with hyperglycemia (HCC)    Emphasized need for significant lifestyle changes, dietary modifications.  Recommended covering for acute vaginitis, oral thrush with fluconazole.  Refilled glipizide and increased dosing to 5 mg twice daily.  Follow-up with a new PCP, provided her with information for this.  Counseled patient on potential for adverse effects with medications prescribed/recommended today, ER and return-to-clinic precautions discussed, patient verbalized understanding.    Wallis Bamberg, PA-C 10/07/23 1315

## 2023-10-09 LAB — CERVICOVAGINAL ANCILLARY ONLY
Bacterial Vaginitis (gardnerella): NEGATIVE
Candida Glabrata: NEGATIVE
Candida Vaginitis: POSITIVE — AB
Chlamydia: NEGATIVE
Comment: NEGATIVE
Comment: NEGATIVE
Comment: NEGATIVE
Comment: NEGATIVE
Comment: NEGATIVE
Comment: NORMAL
Neisseria Gonorrhea: NEGATIVE
Trichomonas: NEGATIVE

## 2023-10-10 ENCOUNTER — Ambulatory Visit: Payer: Self-pay | Admitting: Family Medicine

## 2023-10-10 NOTE — Telephone Encounter (Signed)
Copied from CRM [id number]. Topic: Clinical - Red Word Triage >> Oct 10, 2023  2:08 PM [name] wrote: Red Word that prompted transfer to Nurse Triage: Patient called in stating that she was seen at urgent care for a rash on her tongue and vaginal itching. She states that she was told to follow up with her primary and she is trying to get a sooner appointment than the ones I tried to offer. Patient is also asking for a refill on some of her diabetic medication and requesting to speak to a nurse.  Chief Complaint: HBA1C 14 Symptoms: states out of dm medication and bp meds Frequency: constant Pertinent Negatives: Patient denies fever, weakness, numbness, dizziness, headache Disposition: [] ED /[] Urgent Care (no appt availability in office) / [] Appointment(In office/virtual)/ []  [facility]/ [] Home Care/ [] Refused Recommended Disposition /[]  Mobile Bus/ [x]  Follow-up with PCP Additional Notes: was seen in UC a week ago and was told hba1c was 14.  Patient would like to established with a provider asap.  States was told at [facility] that they would not reorder her medication again for her.  Office to follow up with patient.  Reason for Disposition  [1] Blood glucose > 240 mg/dL (82.9 mmol/L) AND [5] pregnant  Answer Assessment - Initial Assessment Questions 1. BLOOD GLUCOSE: "What is your blood glucose level?"      HbA1C 14 in urgent last week 2. ONSET: "When did you check the blood glucose?"     denies 3. USUAL RANGE: "What is your glucose level usually?" (e.g., usual fasting morning value, usual evening value)     denies 4. KETONES: "Do you check for ketones (urine or blood test strips)?" If Yes, ask: "What does the test show now?"      denies 5. TYPE 1 or 2:  "Do you know what type of diabetes you have?"  (e.g., Type 1, Type 2, Gestational; doesn't know)      Type 2 6. INSULIN: "Do you take insulin?" "What type of insulin(s) do you use? What is the mode of delivery? (syringe,  pen; injection or pump)?"      denies 7. DIABETES PILLS: "Do you take any pills for your diabetes?" If Yes, ask: "Have you missed taking any pills recently?"     glipizide 8. OTHER SYMPTOMS: "Do you have any symptoms?" (e.g., fever, frequent urination, difficulty breathing, dizziness, weakness, vomiting)     denies 9. PREGNANCY: "Is there any chance you are pregnant?" "When was your last menstrual period?"     denies  Protocols used: Diabetes - High Blood Sugar-A-AH

## 2023-10-26 ENCOUNTER — Encounter: Payer: Self-pay | Admitting: Family Medicine

## 2023-10-26 ENCOUNTER — Encounter: Payer: Self-pay | Admitting: Physician Assistant

## 2023-10-26 ENCOUNTER — Ambulatory Visit: Payer: 59 | Admitting: Family Medicine

## 2023-10-26 VITALS — BP 140/82 | HR 96 | Ht 67.0 in | Wt 263.0 lb

## 2023-10-26 DIAGNOSIS — Z1159 Encounter for screening for other viral diseases: Secondary | ICD-10-CM

## 2023-10-26 DIAGNOSIS — E119 Type 2 diabetes mellitus without complications: Secondary | ICD-10-CM | POA: Diagnosis not present

## 2023-10-26 DIAGNOSIS — I10 Essential (primary) hypertension: Secondary | ICD-10-CM | POA: Diagnosis not present

## 2023-10-26 DIAGNOSIS — Z6841 Body Mass Index (BMI) 40.0 and over, adult: Secondary | ICD-10-CM

## 2023-10-26 DIAGNOSIS — K219 Gastro-esophageal reflux disease without esophagitis: Secondary | ICD-10-CM | POA: Diagnosis not present

## 2023-10-26 DIAGNOSIS — E66813 Obesity, class 3: Secondary | ICD-10-CM

## 2023-10-26 DIAGNOSIS — Z0000 Encounter for general adult medical examination without abnormal findings: Secondary | ICD-10-CM

## 2023-10-26 DIAGNOSIS — Z114 Encounter for screening for human immunodeficiency virus [HIV]: Secondary | ICD-10-CM

## 2023-10-26 DIAGNOSIS — Z7984 Long term (current) use of oral hypoglycemic drugs: Secondary | ICD-10-CM

## 2023-10-26 DIAGNOSIS — Z1211 Encounter for screening for malignant neoplasm of colon: Secondary | ICD-10-CM

## 2023-10-26 MED ORDER — BENAZEPRIL-HYDROCHLOROTHIAZIDE 20-12.5 MG PO TABS: 1.0000 | ORAL_TABLET | Freq: Every day | ORAL | 3 refills | Status: DC

## 2023-10-26 MED ORDER — GLIPIZIDE 5 MG PO TABS: 5.0000 mg | ORAL_TABLET | Freq: Two times a day (BID) | ORAL | 3 refills | Status: DC

## 2023-10-26 MED ORDER — ROSUVASTATIN CALCIUM 10 MG PO TABS: 10.0000 mg | ORAL_TABLET | Freq: Every morning | ORAL | 3 refills | Status: DC

## 2023-10-26 MED ORDER — AMLODIPINE BESYLATE 10 MG PO TABS: 10.0000 mg | ORAL_TABLET | Freq: Every day | ORAL | 0 refills | Status: DC

## 2023-10-26 MED ORDER — OMEPRAZOLE 20 MG PO CPDR: 20.0000 mg | DELAYED_RELEASE_CAPSULE | Freq: Every morning | ORAL | 3 refills | Status: DC

## 2023-10-26 NOTE — Assessment & Plan Note (Signed)
 Poor controlled with last A1c 14% at urgent care (states she was our of her glipizide for several months prior) Continue glipizide BID, adjust pending labs (she prefers to avoid injectables if at all possible) On ACEi  On Statin  Discussed diet and exercise F/u in 6 months, sooner pending results

## 2023-10-26 NOTE — Assessment & Plan Note (Signed)
 Patient taking omeprazole daily, but has concerns about long-term side effects. Discussed potential strategies to reduce medication use. -Consider reducing omeprazole to every other day. Lifestyle measures for reflux: - Avoid meals or carbonated beverages within 3 hours of bedtime - Minimize intake of fried, fatty, and spicy foods (this will help decrease gastric acid production) - Raise the head of the bed using 4-6 inch blocks (especially if symptoms are present at night) - Maintain a healthy weight and avoid tight fitting clothes, especially around the waist  - Avoid foods that relax the sphincter or worsen symptoms (chocolate, peppermint, fatty foods, citrus, spicy foods, tomatoes, coffee, caffeine)  - Minimize use of NSAIDs (ibuprofen, Aleve, etc), nicotine, and alcohol

## 2023-10-26 NOTE — Assessment & Plan Note (Signed)
 Labs ordered  Healhty lifestyle encouraged

## 2023-10-26 NOTE — Assessment & Plan Note (Signed)
 Blood pressure is not at goal for age and co-morbidities.   Recommendations: restart amlodipine 10 mg daily, benazepril-hctz 20-12.5 mg daily; nurse visit recheck in 1-2 weeks - BP goal <130/80 - monitor and log blood pressures at home - check around the same time each day in a relaxed setting - Limit salt to <2000 mg/day - Follow DASH eating plan (heart healthy diet) - limit alcohol to 2 standard drinks per day for men and 1 per day for women - avoid tobacco products - get at least 2 hours of regular aerobic exercise weekly Patient aware of signs/symptoms requiring further/urgent evaluation. Labs ordered

## 2023-10-26 NOTE — Progress Notes (Signed)
 New Patient Office Visit  Subjective    Patient ID: [name], female    DOB: 27-Nov-1975  Age: 48 y.o. MRN: [id number]  CC:  Chief Complaint  Patient presents with   Establish Care    HPI [name] presents to establish care   Discussed the use of AI scribe software for clinical note transcription with the patient, who gave verbal consent to proceed.  History of Present Illness [name] is a 48 year old female who presents for primary care follow-up.  She has not had consistent primary care since her last visit in September.. She is currently taking amlodipine 10 mg daily, benazepril/hydrochlorothiazide 20/12.5 mg daily, glipizide 5 mg before breakfast and dinner, and omeprazole daily for gastroesophageal reflux disease (GERD).  Pepcid was ineffective for her. She is also on Crestor 10 mg for hyperlipidemia.  Her diabetes management has been inconsistent due to a lack of medication for about seven months, which she believes contributed to a high A1c in December. She was previously using a Libre sensor to monitor her blood glucose but has been without it for a couple of months. She has a history of intolerance to metformin due to gastrointestinal side effects. There is a family history of diabetes, and she is concerned about her elevated A1c.  She has a history of hypertension and is currently on amlodipine, benazepril, and hydrochlorothiazide. Her blood pressure was slightly elevated today at 140/82, possibly due to being off her medications for a short period. There is a family history of high blood pressure and heart attacks, with her father having had a heart attack at 30 years old.  She has been on omeprazole for GERD for about five years and experiences symptoms if she misses doses for two days. She has a history of reflux and stomach ulcers.  She is on Crestor 10 mg for hyperlipidemia. There is no recent cholesterol check on record. She has a family history of heart  attacks, with her father and grandmother both having had heart attacks.  She has a history of breast cancer diagnosed in 2012, for which she underwent a mastectomy in 2013, along with radiation and chemotherapy. She also has a history of anemia, arthritis, back pain, and neuropathy.  She works at [employer] and has two daughters aged 49 and 73. She reports rare alcohol use, no drug use, and is not currently sexually active. She does not use tobacco, nicotine, or vaping products. Her menstrual cycles are regular, occurring every four weeks, with the last period on the sixth of last month. She has allergies to oxycodone, which causes headaches, and an intolerance to tapes. She has not had a recent eye exam but sees Dr. Shea Evans in [place] for her diabetic eye exams. She has not been consistent with Pap smears and has not had recent colon cancer screening.          Outpatient Encounter Medications as of 10/26/2023  Medication Sig   ibuprofen (ADVIL) 200 MG tablet Take 600 mg by mouth every 8 (eight) hours as needed (pain.).   [DISCONTINUED] amLODipine (NORVASC) 10 MG tablet Take 1 tablet (10 mg total) by mouth daily.   [DISCONTINUED] benazepril-hydrochlorthiazide (LOTENSIN HCT) 20-12.5 MG tablet Take 1 tablet by mouth daily.   [DISCONTINUED] glipiZIDE (GLUCOTROL) 5 MG tablet Take 1 tablet (5 mg total) by mouth 2 (two) times daily before a meal.   [DISCONTINUED] omeprazole (PRILOSEC) 20 MG capsule Take 20 mg by mouth in the morning.   [  DISCONTINUED] rosuvastatin (CRESTOR) 10 MG tablet Take 1 tablet (10 mg total) by mouth in the morning.   amLODipine (NORVASC) 10 MG tablet Take 1 tablet (10 mg total) by mouth daily.   benazepril-hydrochlorthiazide (LOTENSIN HCT) 20-12.5 MG tablet Take 1 tablet by mouth daily.   glipiZIDE (GLUCOTROL) 5 MG tablet Take 1 tablet (5 mg total) by mouth 2 (two) times daily before a meal.   omeprazole (PRILOSEC) 20 MG capsule Take 1 capsule (20 mg total) by mouth in the  morning.   rosuvastatin (CRESTOR) 10 MG tablet Take 1 tablet (10 mg total) by mouth in the morning.   [DISCONTINUED] fluconazole (DIFLUCAN) 150 MG tablet Take 1 tablet (150 mg total) by mouth every 3 (three) days.   No facility-administered encounter medications on file as of 10/26/2023.    Past Medical History:  Diagnosis Date   Anemia    Arthritis    Back pain    lumbar    Breast cancer (HCC) 08/22/2010   right   Cough 09/07/2012   Depression    no current med.   Diabetes mellitus without complication (HCC)    Gastric ulcer    GERD (gastroesophageal reflux disease)    H/O scoliosis    Headache(784.0)    hx. migraine in childhood   Hypertension    under control with med., has been on med. x 5 yr.   Peripheral neuropathy    s/p chemotherapy   S/P radiation therapy 12/19/11 - 02/03/12   Right Chest Wall and Axilla   Status post chemotherapy    finished 08/2011    Past Surgical History:  Procedure Laterality Date   ESOPHAGOGASTRODUODENOSCOPY (EGD) WITH PROPOFOL  09/06/2012   Procedure: ESOPHAGOGASTRODUODENOSCOPY (EGD) WITH PROPOFOL;  Surgeon: Rachael Fee, MD;  Location: [facility];  Service: Endoscopy;  Laterality: N/A;   HIP PINNING  age 57   bilateral   MASTECTOMY W/ SENTINEL NODE BIOPSY  10/14/2011   Procedure: MASTECTOMY WITH SENTINEL LYMPH NODE BIOPSY;  Surgeon: Robyne Askew, MD;  Location: [facility];  Service: General;  Laterality: Right;   OPEN REDUCTION INTERNAL FIXATION (ORIF) PROXIMAL PHALANX Left 11/14/2022   Procedure: OPEN REDUCTION INTERNAL FIXATION (ORIF) left small finger;  Surgeon: Bradly Bienenstock, MD;  Location: [facility];  Service: Orthopedics;  Laterality: Left;  regional with iv sedation   PORT-A-CATH REMOVAL  09/12/2012   Procedure: REMOVAL PORT-A-CATH;  Surgeon: Robyne Askew, MD;  Location: [facility];  Service: General;  Laterality: Left;   PORTACATH PLACEMENT  04/08/2011   WOUND EXPLORATION  10/14/2011   right mastectomy wound    Family  History  Problem Relation Age of Onset   Heart disease Father        heart attack   Heart attack Father        died age 19   Hypertension Father    Hypertension Mother    Anemia Mother    Diabetes Mother    Stroke Mother    Aneurysm Mother        brain   Heart disease Maternal Grandmother        MI   Hypertension Maternal Grandmother    Diabetes Maternal Grandmother    Hypertension Brother    Diabetes Brother    Hypertension Paternal Grandmother    Diabetes Paternal Grandmother     Social History   Socioeconomic History   Marital status: Married    Spouse name: Not on file   Number of children: 2  Years of education: Not on file   Highest education level: Not on file  Occupational History    Employer: [employer]  Tobacco Use   Smoking status: Never   Smokeless tobacco: Never  Vaping Use   Vaping status: Never Used  Substance and Sexual Activity   Alcohol use: Yes    Comment: rarely   Drug use: No   Sexual activity: Not Currently    Comment: menerache age 57, GxP2,1st pregnancy age 34, 1 miscarriage  Other Topics Concern   Not on file  Social History Narrative   Not on file   Social Drivers of Health   Financial Resource Strain: Not on file  Food Insecurity: Not on file  Transportation Needs: Not on file  Physical Activity: Not on file  Stress: Not on file  Social Connections: Not on file  Intimate Partner Violence: Not on file    ROS All review of systems negative except what is listed in the HPI     Objective    BP (!) 140/82   Pulse 96   Ht 5\' 7"  (1.702 m)   Wt 263 lb (119.3 kg)   SpO2 100%   BMI 41.19 kg/m  LMP 09/28/23   Physical Exam Vitals reviewed.  Constitutional:      Appearance: Normal appearance. She is obese.  Cardiovascular:     Rate and Rhythm: Normal rate and regular rhythm.  Pulmonary:     Effort: Pulmonary effort is normal.     Breath sounds: Normal breath sounds.  Skin:    General: Skin is warm and dry.   Neurological:     Mental Status: She is alert and oriented to person, place, and time.  Psychiatric:        Mood and Affect: Mood normal.        Behavior: Behavior normal.        Thought Content: Thought content normal.        Judgment: Judgment normal.               Assessment & Plan:   Problem List Items Addressed This Visit       Active Problems   Hypertension - Primary (Chronic)   Blood pressure is not at goal for age and co-morbidities.   Recommendations: restart amlodipine 10 mg daily, benazepril-hctz 20-12.5 mg daily; nurse visit recheck in 1-2 weeks - BP goal <130/80 - monitor and log blood pressures at home - check around the same time each day in a relaxed setting - Limit salt to <2000 mg/day - Follow DASH eating plan (heart healthy diet) - limit alcohol to 2 standard drinks per day for men and 1 per day for women - avoid tobacco products - get at least 2 hours of regular aerobic exercise weekly Patient aware of signs/symptoms requiring further/urgent evaluation. Labs ordered        Relevant Medications   amLODipine (NORVASC) 10 MG tablet   benazepril-hydrochlorthiazide (LOTENSIN HCT) 20-12.5 MG tablet   rosuvastatin (CRESTOR) 10 MG tablet   Obesity (Chronic)   Labs ordered  Healhty lifestyle encouraged      Relevant Medications   glipiZIDE (GLUCOTROL) 5 MG tablet   Other Relevant Orders   CBC with Differential/Platelet   Comprehensive metabolic panel   Lipid panel   TSH   Hemoglobin A1c   Diabetes mellitus type II, non insulin dependent (HCC) (Chronic)   Poor controlled with last A1c 14% at urgent care (states she was our of her glipizide for several  months prior) Continue glipizide BID, adjust pending labs (she prefers to avoid injectables if at all possible) On ACEi  On Statin  Discussed diet and exercise F/u in 6 months, sooner pending results        Relevant Medications   benazepril-hydrochlorthiazide (LOTENSIN HCT) 20-12.5 MG  tablet   rosuvastatin (CRESTOR) 10 MG tablet   glipiZIDE (GLUCOTROL) 5 MG tablet   Other Relevant Orders   CBC with Differential/Platelet   Comprehensive metabolic panel   Lipid panel   Hemoglobin A1c   Microalbumin / creatinine urine ratio   GERD (gastroesophageal reflux disease) (Chronic)   Patient taking omeprazole daily, but has concerns about long-term side effects. Discussed potential strategies to reduce medication use. -Consider reducing omeprazole to every other day. Lifestyle measures for reflux: - Avoid meals or carbonated beverages within 3 hours of bedtime - Minimize intake of fried, fatty, and spicy foods (this will help decrease gastric acid production) - Raise the head of the bed using 4-6 inch blocks (especially if symptoms are present at night) - Maintain a healthy weight and avoid tight fitting clothes, especially around the waist  - Avoid foods that relax the sphincter or worsen symptoms (chocolate, peppermint, fatty foods, citrus, spicy foods, tomatoes, coffee, caffeine)  - Minimize use of NSAIDs (ibuprofen, Aleve, etc), nicotine, and alcohol       Relevant Medications   omeprazole (PRILOSEC) 20 MG capsule   Other Visit Diagnoses       Encounter for medical examination to establish care         Encounter for screening for HIV       Relevant Orders   HIV Antibody (routine testing w rflx)     Encounter for hepatitis C screening test for low risk patient       Relevant Orders   Hepatitis C antibody     Screen for colon cancer       Relevant Orders   Cologuard       Return in about 6 months (around 04/27/2024) for physical/pap (nurse visit BP check and labs after 11/02/23).   Clayborne Dana, NP

## 2023-10-31 ENCOUNTER — Other Ambulatory Visit (HOSPITAL_COMMUNITY): Payer: Self-pay

## 2023-10-31 ENCOUNTER — Encounter: Payer: Self-pay | Admitting: Physician Assistant

## 2023-10-31 ENCOUNTER — Telehealth: Payer: Self-pay | Admitting: Pharmacy Technician

## 2023-10-31 NOTE — Telephone Encounter (Signed)
 Pharmacy Patient Advocate Encounter   Received notification from CoverMyMeds that prior authorization for Benazepril-hydroCHLOROthiazide 20-12.5MG  tablets is required/requested.   Insurance verification completed.   The patient is insured through [facility] .   Per test claim: PA required; PA submitted to above mentioned insurance via CoverMyMeds Key/confirmation #/EOC [id number] Status is pending

## 2023-11-01 ENCOUNTER — Other Ambulatory Visit (HOSPITAL_COMMUNITY): Payer: Self-pay

## 2023-11-01 NOTE — Telephone Encounter (Signed)
 Pharmacy Patient Advocate Encounter  Received notification from [facility] that Prior Authorization for Benazepril-hydroCHLOROthiazide 20-12.5MG  tablets  has been APPROVED from 10/31/2023 to 10/30/2024. Ran test claim, Copay is $4.00. This test claim was processed through [facility]- copay amounts may vary at other pharmacies due to pharmacy/plan contracts, or as the patient moves through the different stages of their insurance plan.   PA #/Case ID/Reference #: [id number]

## 2023-11-02 ENCOUNTER — Other Ambulatory Visit (HOSPITAL_COMMUNITY): Payer: Self-pay

## 2023-11-03 ENCOUNTER — Other Ambulatory Visit (INDEPENDENT_AMBULATORY_CARE_PROVIDER_SITE_OTHER)

## 2023-11-03 DIAGNOSIS — E119 Type 2 diabetes mellitus without complications: Secondary | ICD-10-CM

## 2023-11-03 DIAGNOSIS — E66813 Obesity, class 3: Secondary | ICD-10-CM | POA: Diagnosis not present

## 2023-11-03 DIAGNOSIS — D509 Iron deficiency anemia, unspecified: Secondary | ICD-10-CM

## 2023-11-03 DIAGNOSIS — Z6841 Body Mass Index (BMI) 40.0 and over, adult: Secondary | ICD-10-CM | POA: Diagnosis not present

## 2023-11-03 DIAGNOSIS — Z1159 Encounter for screening for other viral diseases: Secondary | ICD-10-CM

## 2023-11-03 DIAGNOSIS — Z114 Encounter for screening for human immunodeficiency virus [HIV]: Secondary | ICD-10-CM

## 2023-11-03 LAB — CBC WITH DIFFERENTIAL/PLATELET
Basophils Absolute: 0.1 10*3/uL (ref 0.0–0.1)
Basophils Relative: 1.5 % (ref 0.0–3.0)
Eosinophils Absolute: 0.3 10*3/uL (ref 0.0–0.7)
Eosinophils Relative: 4.4 % (ref 0.0–5.0)
HCT: 32.7 % — ABNORMAL LOW (ref 36.0–46.0)
Hemoglobin: 9.8 g/dL — ABNORMAL LOW (ref 12.0–15.0)
Lymphocytes Relative: 23.6 % (ref 12.0–46.0)
Lymphs Abs: 1.5 10*3/uL (ref 0.7–4.0)
MCHC: 30.1 g/dL (ref 30.0–36.0)
MCV: 72.3 fl — ABNORMAL LOW (ref 78.0–100.0)
Monocytes Absolute: 0.7 10*3/uL (ref 0.1–1.0)
Monocytes Relative: 10.5 % (ref 3.0–12.0)
Neutro Abs: 3.9 10*3/uL (ref 1.4–7.7)
Neutrophils Relative %: 60 % (ref 43.0–77.0)
Platelets: 497 10*3/uL — ABNORMAL HIGH (ref 150.0–400.0)
RBC: 4.51 Mil/uL (ref 3.87–5.11)
RDW: 17.7 % — ABNORMAL HIGH (ref 11.5–15.5)
WBC: 6.5 10*3/uL (ref 4.0–10.5)

## 2023-11-03 LAB — COMPREHENSIVE METABOLIC PANEL
ALT: 11 U/L (ref 0–35)
AST: 14 U/L (ref 0–37)
Albumin: 4.1 g/dL (ref 3.5–5.2)
Alkaline Phosphatase: 95 U/L (ref 39–117)
BUN: 9 mg/dL (ref 6–23)
CO2: 29 meq/L (ref 19–32)
Calcium: 9.7 mg/dL (ref 8.4–10.5)
Chloride: 104 meq/L (ref 96–112)
Creatinine, Ser: 0.64 mg/dL (ref 0.40–1.20)
GFR: 104.78 mL/min (ref >60.00)
Glucose, Bld: 124 mg/dL — ABNORMAL HIGH (ref 70–99)
Potassium: 4.4 meq/L (ref 3.5–5.1)
Sodium: 140 meq/L (ref 135–145)
Total Bilirubin: 0.5 mg/dL (ref 0.2–1.2)
Total Protein: 7.6 g/dL (ref 6.0–8.3)

## 2023-11-03 LAB — LIPID PANEL
Cholesterol: 156 mg/dL (ref 0–200)
HDL: 38.8 mg/dL — ABNORMAL LOW (ref >39.00)
LDL Cholesterol: 97 mg/dL (ref 0–99)
NonHDL: 117.02
Total CHOL/HDL Ratio: 4
Triglycerides: 99 mg/dL (ref 0.0–149.0)
VLDL: 19.8 mg/dL (ref 0.0–40.0)

## 2023-11-03 LAB — MICROALBUMIN / CREATININE URINE RATIO
Creatinine,U: 82.3 mg/dL
Microalb Creat Ratio: 137.7 mg/g — ABNORMAL HIGH (ref 0.0–30.0)
Microalb, Ur: 11.3 mg/dL — ABNORMAL HIGH (ref 0.0–1.9)

## 2023-11-03 LAB — TSH: TSH: 2.22 u[IU]/mL (ref 0.35–5.50)

## 2023-11-03 LAB — HEMOGLOBIN A1C: Hgb A1c MFr Bld: 11.5 % — ABNORMAL HIGH (ref 4.6–6.5)

## 2023-11-04 LAB — HEPATITIS C ANTIBODY: Hepatitis C Ab: NONREACTIVE

## 2023-11-04 LAB — HIV ANTIBODY (ROUTINE TESTING W REFLEX): HIV 1&2 Ab, 4th Generation: NONREACTIVE

## 2023-11-06 ENCOUNTER — Encounter: Payer: Self-pay | Admitting: Family Medicine

## 2023-11-06 ENCOUNTER — Other Ambulatory Visit

## 2023-11-06 DIAGNOSIS — D509 Iron deficiency anemia, unspecified: Secondary | ICD-10-CM

## 2023-11-06 MED ORDER — EMPAGLIFLOZIN 10 MG PO TABS: 10.0000 mg | ORAL_TABLET | Freq: Every day | ORAL | 3 refills | Status: DC

## 2023-11-06 NOTE — Progress Notes (Signed)
 Microalbumin-Creatinine ratio is elevated. We will need to check this again in 3-6 months to confirm. A1c is a little better, but still quite high. Since you prefer oral medications, let's go ahead and try for an SGLT2 medication (Jardiance) to also help protect your kidney function. We will see which one your insurance will help pay for.   **Low MCV - I'll add on iron levels to see if they are stable.   Follow-up in 3 months

## 2023-11-06 NOTE — Addendum Note (Signed)
 Addended by: Hyman Hopes B on: 11/06/2023 02:53 PM   Modules accepted: Orders

## 2023-11-07 ENCOUNTER — Encounter: Payer: Self-pay | Admitting: Family Medicine

## 2023-11-07 LAB — IRON, TOTAL/TOTAL IRON BINDING CAP
%SAT: 7 % — ABNORMAL LOW (ref 16–45)
Iron: 35 ug/dL — ABNORMAL LOW (ref 40–190)
TIBC: 527 ug/dL — ABNORMAL HIGH (ref 250–450)

## 2023-11-07 LAB — FERRITIN: Ferritin: 8 ng/mL — ABNORMAL LOW (ref 16–232)

## 2023-11-07 NOTE — Addendum Note (Signed)
 Addended by: Hyman Hopes B on: 11/07/2023 07:49 AM   Modules accepted: Orders

## 2023-11-10 ENCOUNTER — Telehealth: Payer: Self-pay

## 2023-11-10 ENCOUNTER — Other Ambulatory Visit (HOSPITAL_COMMUNITY): Payer: Self-pay

## 2023-11-10 NOTE — Telephone Encounter (Signed)
 Pharmacy Patient Advocate Encounter   Received notification from CoverMyMeds that prior authorization for Jardiance 10MG  tablets is required/requested.   Insurance verification completed.   The patient is insured through [facility] .   Per test claim: PA required; PA started via CoverMyMeds. KEY [id number] . Waiting for clinical questions to populate.

## 2023-11-13 ENCOUNTER — Encounter: Payer: Self-pay | Admitting: Neurology

## 2023-11-13 ENCOUNTER — Other Ambulatory Visit (HOSPITAL_COMMUNITY): Payer: Self-pay

## 2023-11-13 NOTE — Telephone Encounter (Signed)
 Pharmacy Patient Advocate Encounter  Received notification from [facility] that Prior Authorization for Jardiance 10MG  tablets  has been APPROVED from 11/10/23 to 11/09/24. Ran test claim, Copay is $4. This test claim was processed through [facility]- copay amounts may vary at other pharmacies due to pharmacy/plan contracts, or as the patient moves through the different stages of their insurance plan.   PA #/Case ID/Reference #: [id number]

## 2023-11-16 ENCOUNTER — Other Ambulatory Visit (HOSPITAL_COMMUNITY): Payer: Self-pay

## 2023-11-17 ENCOUNTER — Inpatient Hospital Stay

## 2023-11-17 ENCOUNTER — Encounter: Payer: Self-pay | Admitting: Family

## 2023-11-17 ENCOUNTER — Inpatient Hospital Stay: Attending: Family | Admitting: Family

## 2023-11-17 ENCOUNTER — Other Ambulatory Visit: Payer: Self-pay

## 2023-11-17 VITALS — BP 119/89 | HR 112 | Temp 98.0°F | Resp 19 | Ht 67.0 in | Wt 251.7 lb

## 2023-11-17 DIAGNOSIS — D509 Iron deficiency anemia, unspecified: Secondary | ICD-10-CM | POA: Diagnosis present

## 2023-11-17 DIAGNOSIS — Z79899 Other long term (current) drug therapy: Secondary | ICD-10-CM | POA: Diagnosis not present

## 2023-11-17 DIAGNOSIS — D508 Other iron deficiency anemias: Secondary | ICD-10-CM | POA: Diagnosis not present

## 2023-11-17 NOTE — Progress Notes (Signed)
 Hematology/Oncology Consultation   Name: [name]      MRN: [id number]    Location: Room/bed info not found  Date: 11/17/2023 Time:11:13 AM   REFERRING PHYSICIAN:  Hyman Hopes, NP  REASON FOR CONSULT:  Iron deficiency anemia    DIAGNOSIS: Iron deficiency anemia   HISTORY OF PRESENT ILLNESS:  Ms. [name] is a pleasant 48 yo African American female with long history of IDA.  She is symptomatic with dizziness last 15-20 minutes and is also craving ice.  Iron saturation is only 7% and ferritin 8.  She has not restarted oral iron.  No known familial history of IDA. Her nephew does carry the sickle cell trait.  She denies fatigue at this time.  Her cycle is regular and heavy on the second day. No other blood loss noted, no abnormal bruising, no petechiae.  She had a right lower extremity DVT 29 years ago that she states resolved before needing treatment. No other history of thrombotic event.  She has 2 children and history of 3 miscarriages.  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 She was treated by Dr. Jeanette Caprice.  She completed 4 cycles of dose dense Doxyrubicin and Cyclophosphamide followed by weekly Paclitaxel for 8 weeks. She then had a right mastectomy and sentinel lymph node sampling in 09/2011. She completed radiation therapy with Dr. Roselind Messier in 01/2012. She had started antiestrogen therapy with Zoladex in 12/2011 every 3 months then started Anastrozole in late 01/2012. She completed 7 years of anastrozole and goserelin in 10/202.  She is due for left breast mammogram.  No familial history of cancer.  She has type II diabetes and is currently on Glucotrol and Jardiance.  No history of thyroid disease.  She is on Prilosec for GERD.  No fever, chills, n/v, cough, rash, SOB, cough, rash, dizziness, SOB, chest pain, palpitations, abdominal pain or changes in bowel or bladder habits.  No swelling, tenderness or numbness  in her extremities at this time.  She has intermittent tingling in her finger and feet.  No falls or syncope reported.  No ETOH, smoking or recreational drug use.  Appetite and hydration are good. She is currently on a 2 weeks fruit and vegetable cleanse. She does not eat a lot of red meat.  Weight is stable at 251 lbs.  She stays busy working at a [employer].   ROS: All other 10 point review of systems is negative.   PAST MEDICAL HISTORY:   Past Medical History:  Diagnosis Date   Anemia    Arthritis    Back pain    lumbar    Breast cancer (HCC) 08/22/2010   right   Cough 09/07/2012   Depression    no current med.   Diabetes mellitus without complication (HCC)    Gastric ulcer    GERD (gastroesophageal reflux disease)    H/O scoliosis    Headache(784.0)    hx. migraine in childhood   Hypertension    under control with med., has been on med. x 5 yr.   Peripheral neuropathy    s/p chemotherapy   S/P radiation therapy 12/19/11 - 02/03/12   Right Chest Wall and Axilla   Status post chemotherapy    finished 08/2011    ALLERGIES: Allergies  Allergen Reactions   Oxycodone Other (See Comments)    headache   Tape Other (See Comments)    SKIN TEARS   Metformin Diarrhea  MEDICATIONS:  Current Outpatient Medications on File Prior to Visit  Medication Sig Dispense Refill   amLODipine (NORVASC) 10 MG tablet Take 1 tablet (10 mg total) by mouth daily. 30 tablet 0   benazepril-hydrochlorthiazide (LOTENSIN HCT) 20-12.5 MG tablet Take 1 tablet by mouth daily. 90 tablet 3   empagliflozin (JARDIANCE) 10 MG TABS tablet Take 1 tablet (10 mg total) by mouth daily before breakfast. 90 tablet 3   glipiZIDE (GLUCOTROL) 5 MG tablet Take 1 tablet (5 mg total) by mouth 2 (two) times daily before a meal. 180 tablet 3   ibuprofen (ADVIL) 200 MG tablet Take 600 mg by mouth every 8 (eight) hours as needed (pain.).     omeprazole (PRILOSEC) 20 MG capsule Take 1 capsule (20 mg total) by  mouth in the morning. 90 capsule 3   rosuvastatin (CRESTOR) 10 MG tablet Take 1 tablet (10 mg total) by mouth in the morning. 90 tablet 3   No current facility-administered medications on file prior to visit.     PAST SURGICAL HISTORY Past Surgical History:  Procedure Laterality Date   ESOPHAGOGASTRODUODENOSCOPY (EGD) WITH PROPOFOL  09/06/2012   Procedure: ESOPHAGOGASTRODUODENOSCOPY (EGD) WITH PROPOFOL;  Surgeon: Rachael Fee, MD;  Location: [facility];  Service: Endoscopy;  Laterality: N/A;   HIP PINNING  age 42   bilateral   MASTECTOMY W/ SENTINEL NODE BIOPSY  10/14/2011   Procedure: MASTECTOMY WITH SENTINEL LYMPH NODE BIOPSY;  Surgeon: Robyne Askew, MD;  Location: [facility];  Service: General;  Laterality: Right;   OPEN REDUCTION INTERNAL FIXATION (ORIF) PROXIMAL PHALANX Left 11/14/2022   Procedure: OPEN REDUCTION INTERNAL FIXATION (ORIF) left small finger;  Surgeon: Bradly Bienenstock, MD;  Location: [facility];  Service: Orthopedics;  Laterality: Left;  regional with iv sedation   PORT-A-CATH REMOVAL  09/12/2012   Procedure: REMOVAL PORT-A-CATH;  Surgeon: Robyne Askew, MD;  Location: [facility];  Service: General;  Laterality: Left;   PORTACATH PLACEMENT  04/08/2011   WOUND EXPLORATION  10/14/2011   right mastectomy wound    FAMILY HISTORY: Family History  Problem Relation Age of Onset   Heart disease Father        heart attack   Heart attack Father        died age 62   Hypertension Father    Hypertension Mother    Anemia Mother    Diabetes Mother    Stroke Mother    Aneurysm Mother        brain   Heart disease Maternal Grandmother        MI   Hypertension Maternal Grandmother    Diabetes Maternal Grandmother    Hypertension Brother    Diabetes Brother    Hypertension Paternal Grandmother    Diabetes Paternal Grandmother     SOCIAL HISTORY:  reports that she has never smoked. She has never used smokeless tobacco. She reports current alcohol use. She reports  that she does not use drugs.  PERFORMANCE STATUS: The patient's performance status is 1 - Symptomatic but completely ambulatory  PHYSICAL EXAM: Most Recent Vital Signs: There were no vitals taken for this visit. BP 119/89 (BP Location: Right Arm, Patient Position: Sitting)   Pulse (!) 112   Temp 98 F (36.7 C) (Oral)   Resp 19   Ht 5\' 7"  (1.702 m)   Wt 251 lb 11.2 oz (114.2 kg)   SpO2 100%   BMI 39.42 kg/m   General Appearance:    Alert, cooperative,  no distress, appears stated age  Head:    Normocephalic, without obvious abnormality, atraumatic  Eyes:    PERRL, conjunctiva/corneas clear, EOM's intact, fundi    benign, both eyes        Throat:   Lips, mucosa, and tongue normal; teeth and gums normal  Neck:   Supple, symmetrical, trachea midline, no adenopathy;    thyroid:  no enlargement/tenderness/nodules; no carotid   bruit or JVD  Back:     Symmetric, no curvature, ROM normal, no CVA tenderness  Lungs:     Clear to auscultation bilaterally, respirations unlabored  Chest Wall:    No tenderness or deformity   Heart:    Regular rate and rhythm, S1 and S2 normal, no murmur, rub   or gallop     Abdomen:     Soft, non-tender, bowel sounds active all four quadrants,    no masses, no organomegaly        Extremities:   Extremities normal, atraumatic, no cyanosis or edema  Pulses:   2+ and symmetric all extremities  Skin:   Skin color, texture, turgor normal, no rashes or lesions  Lymph nodes:   Cervical, supraclavicular, and axillary nodes normal  Neurologic:   CNII-XII intact, normal strength, sensation and reflexes    throughout    LABORATORY DATA:  No results found for this or any previous visit (from the past 48 hours).    RADIOGRAPHY: No results found.     PATHOLOGY: none   ASSESSMENT/PLAN: Ms. Bass is a pleasant 48 yo [place] American female with long history of IDA.  We will get her set up for 1 doses of Monoferric.  Follow-up in 8 weeks to reassess.   All  questions were answered. The patient knows to call the clinic with any problems, questions or concerns. We can certainly see the patient much sooner if necessary.   Eileen Stanford, NP

## 2023-11-29 ENCOUNTER — Encounter: Payer: Self-pay | Admitting: Family

## 2023-12-01 ENCOUNTER — Inpatient Hospital Stay: Attending: Family

## 2023-12-01 VITALS — BP 153/89 | HR 83 | Temp 98.8°F | Resp 17

## 2023-12-01 DIAGNOSIS — D509 Iron deficiency anemia, unspecified: Secondary | ICD-10-CM | POA: Insufficient documentation

## 2023-12-01 DIAGNOSIS — D500 Iron deficiency anemia secondary to blood loss (chronic): Secondary | ICD-10-CM

## 2023-12-01 MED ORDER — SODIUM CHLORIDE 0.9 % IV SOLN
1000.0000 mg | Freq: Once | INTRAVENOUS | Status: AC
  Administered 2023-12-01: 1000 mg via INTRAVENOUS
  Filled 2023-12-01: qty 10

## 2023-12-01 MED ORDER — SODIUM CHLORIDE 0.9 % IV SOLN: INTRAVENOUS | Status: DC

## 2023-12-01 NOTE — Patient Instructions (Signed)

## 2023-12-04 ENCOUNTER — Other Ambulatory Visit: Payer: Self-pay | Admitting: Family Medicine

## 2023-12-04 DIAGNOSIS — I10 Essential (primary) hypertension: Secondary | ICD-10-CM

## 2023-12-07 ENCOUNTER — Encounter: Payer: Self-pay | Admitting: Family

## 2023-12-10 LAB — COLOGUARD: COLOGUARD: NEGATIVE

## 2023-12-11 ENCOUNTER — Encounter: Payer: Self-pay | Admitting: Family Medicine

## 2024-01-19 ENCOUNTER — Inpatient Hospital Stay

## 2024-01-19 ENCOUNTER — Inpatient Hospital Stay (HOSPITAL_BASED_OUTPATIENT_CLINIC_OR_DEPARTMENT_OTHER): Admitting: Family

## 2024-01-19 ENCOUNTER — Inpatient Hospital Stay: Attending: Family

## 2024-01-19 VITALS — BP 119/76 | HR 98 | Temp 99.0°F | Resp 18 | Wt 250.1 lb

## 2024-01-19 DIAGNOSIS — D508 Other iron deficiency anemias: Secondary | ICD-10-CM

## 2024-01-19 DIAGNOSIS — D509 Iron deficiency anemia, unspecified: Secondary | ICD-10-CM | POA: Diagnosis present

## 2024-01-19 DIAGNOSIS — D500 Iron deficiency anemia secondary to blood loss (chronic): Secondary | ICD-10-CM

## 2024-01-19 DIAGNOSIS — Z7984 Long term (current) use of oral hypoglycemic drugs: Secondary | ICD-10-CM | POA: Diagnosis not present

## 2024-01-19 LAB — FERRITIN: Ferritin: 256 ng/mL (ref 11–307)

## 2024-01-19 LAB — CBC WITH DIFFERENTIAL (CANCER CENTER ONLY)
Abs Immature Granulocytes: 0.02 10*3/uL (ref 0.00–0.07)
Basophils Absolute: 0.1 10*3/uL (ref 0.0–0.1)
Basophils Relative: 1 %
Eosinophils Absolute: 0.2 10*3/uL (ref 0.0–0.5)
Eosinophils Relative: 4 %
HCT: 38.4 % (ref 36.0–46.0)
Hemoglobin: 11.9 g/dL — ABNORMAL LOW (ref 12.0–15.0)
Immature Granulocytes: 0 %
Lymphocytes Relative: 35 %
Lymphs Abs: 1.8 10*3/uL (ref 0.7–4.0)
MCH: 23.1 pg — ABNORMAL LOW (ref 26.0–34.0)
MCHC: 31 g/dL (ref 30.0–36.0)
MCV: 74.6 fL — ABNORMAL LOW (ref 80.0–100.0)
Monocytes Absolute: 0.5 10*3/uL (ref 0.1–1.0)
Monocytes Relative: 9 %
Neutro Abs: 2.7 10*3/uL (ref 1.7–7.7)
Neutrophils Relative %: 51 %
Platelet Count: UNDETERMINED 10*3/uL (ref 150–400)
RBC: 5.15 MIL/uL — ABNORMAL HIGH (ref 3.87–5.11)
RDW: 18.6 % — ABNORMAL HIGH (ref 11.5–15.5)
Smear Review: NORMAL
WBC Count: 5.3 10*3/uL (ref 4.0–10.5)
nRBC: 0 % (ref 0.0–0.2)

## 2024-01-19 LAB — IRON AND IRON BINDING CAPACITY (CC-WL,HP ONLY)
Iron: 115 ug/dL (ref 28–170)
Saturation Ratios: 32 % — ABNORMAL HIGH (ref 10.4–31.8)
TIBC: 360 ug/dL (ref 250–450)
UIBC: 245 ug/dL (ref 148–442)

## 2024-01-19 LAB — RETICULOCYTES
Immature Retic Fract: 13 % (ref 2.3–15.9)
RBC.: 5.19 MIL/uL — ABNORMAL HIGH (ref 3.87–5.11)
Retic Count, Absolute: 81.5 10*3/uL (ref 19.0–186.0)
Retic Ct Pct: 1.6 % (ref 0.4–3.1)

## 2024-01-19 NOTE — Progress Notes (Signed)
 Hematology and Oncology Follow Up Visit  [name] [id number] 18-Apr-1976 48 y.o. 01/19/2024   Principle Diagnosis:  IDA  Current Therapy:   IV iron  as indicated    Interim History:  [name] is here today for follow-up. She is feeling tired with muscle fatigue.  Occasional dizziness.  No abnormal blood loss. Her cycle is regular with normal flow at this time.  No bruising or petechiae.  No fever, chills, n/v, cough, rash, dizziness, SOB, chest pain, palpitations, abdominal pain or changes in bowel or bladder habits.  No tenderness, numbness or tingling in her extremities at this time.  Puffiness in feet and ankles come and go.  No falls or syncope reported.  Appetite and hydration have been poor recently. She is making herself eat. Weight is stable at 250 lbs.   ECOG Performance Status: 1 - Symptomatic but completely ambulatory  Medications:  Allergies as of 01/19/2024       Reactions   Oxycodone  Other (See Comments)   headache   Tape Other (See Comments)   SKIN TEARS   Metformin  Diarrhea        Medication List        Accurate as of Jan 19, 2024 10:02 AM. If you have any questions, ask your nurse or doctor.          amLODipine  10 MG tablet Commonly known as: NORVASC  Take 1 tablet (10 mg total) by mouth daily.   benazepril -hydrochlorthiazide 20-12.5 MG tablet Commonly known as: LOTENSIN  HCT Take 1 tablet by mouth daily.   empagliflozin  10 MG Tabs tablet Commonly known as: Jardiance  Take 1 tablet (10 mg total) by mouth daily before breakfast.   glipiZIDE  5 MG tablet Commonly known as: GLUCOTROL  Take 1 tablet (5 mg total) by mouth 2 (two) times daily before a meal.   ibuprofen  200 MG tablet Commonly known as: ADVIL  Take 600 mg by mouth every 8 (eight) hours as needed (pain.).   omeprazole  20 MG capsule Commonly known as: PRILOSEC Take 1 capsule (20 mg total) by mouth in the morning.   rosuvastatin  10 MG tablet Commonly known as: CRESTOR  Take  1 tablet (10 mg total) by mouth in the morning.        Allergies:  Allergies  Allergen Reactions   Oxycodone  Other (See Comments)    headache   Tape Other (See Comments)    SKIN TEARS   Metformin  Diarrhea    Past Medical History, Surgical history, Social history, and Family History were reviewed and updated.  Review of Systems: All other 10 point review of systems is negative.   Physical Exam:  vitals were not taken for this visit.   Wt Readings from Last 3 Encounters:  11/17/23 251 lb 11.2 oz (114.2 kg)  10/26/23 263 lb (119.3 kg)  11/19/22 240 lb (108.9 kg)    Ocular: Sclerae unicteric, pupils equal, round and reactive to light Ear-nose-throat: Oropharynx clear, dentition fair Lymphatic: No cervical or supraclavicular adenopathy Lungs no rales or rhonchi, good excursion bilaterally Heart regular rate and rhythm, no murmur appreciated Abd soft, nontender, positive bowel sounds MSK no focal spinal tenderness, no joint edema Neuro: non-focal, well-oriented, appropriate affect Breasts: Deferred   Lab Results  Component Value Date   WBC 6.5 11/03/2023   HGB 9.8 (L) 11/03/2023   HCT 32.7 (L) 11/03/2023   MCV 72.3 (L) 11/03/2023   PLT 497.0 (H) 11/03/2023   Lab Results  Component Value Date   FERRITIN 8 (L) 11/06/2023   IRON   35 (L) 11/06/2023   TIBC 527 (H) 11/06/2023   IRONPCTSAT 7 (L) 11/06/2023   Lab Results  Component Value Date   RETICCTPCT 1.47 08/31/2011   RBC 4.51 11/03/2023   RETICCTABS 44.84 08/31/2011   No results found for: "KPAFRELGTCHN", "LAMBDASER", "KAPLAMBRATIO" No results found for: "IGGSERUM", "IGA", "IGMSERUM" No results found for: "TOTALPROTELP", "ALBUMINELP", "A1GS", "A2GS", "BETS", "BETA2SER", "GAMS", "MSPIKE", "SPEI"   Chemistry      Component Value Date/Time   NA 140 11/03/2023 1000   NA 140 08/04/2023 1105   NA 143 07/19/2017 1559   K 4.4 11/03/2023 1000   K 3.8 07/19/2017 1559   CL 104 11/03/2023 1000   CL 101 12/21/2012  1019   CO2 29 11/03/2023 1000   CO2 28 07/19/2017 1559   BUN 9 11/03/2023 1000   BUN 8 08/04/2023 1105   BUN 9.1 07/19/2017 1559   CREATININE 0.64 11/03/2023 1000   CREATININE 0.94 05/28/2019 1511   CREATININE 0.8 07/19/2017 1559      Component Value Date/Time   CALCIUM  9.7 11/03/2023 1000   CALCIUM  9.5 07/19/2017 1559   ALKPHOS 95 11/03/2023 1000   ALKPHOS 123 07/19/2017 1559   AST 14 11/03/2023 1000   AST 19 05/28/2019 1511   AST 19 07/19/2017 1559   ALT 11 11/03/2023 1000   ALT 21 05/28/2019 1511   ALT 17 07/19/2017 1559   BILITOT 0.5 11/03/2023 1000   BILITOT 0.4 08/04/2023 1105   BILITOT <0.2 (L) 05/28/2019 1511   BILITOT 0.39 07/19/2017 1559       Impression and Plan: [name] is a pleasant 48 yo African American female with long history of IDA.  Iron  studies are pending. We will replace again if needed.  Follow-up in 3 months.   Kennard Pea, NP 5/30/202510:02 AM

## 2024-02-09 ENCOUNTER — Encounter: Payer: Self-pay | Admitting: Family Medicine

## 2024-02-09 ENCOUNTER — Ambulatory Visit (INDEPENDENT_AMBULATORY_CARE_PROVIDER_SITE_OTHER): Admitting: Family Medicine

## 2024-02-09 VITALS — BP 128/89 | HR 106 | Ht 67.0 in | Wt 252.0 lb

## 2024-02-09 DIAGNOSIS — I10 Essential (primary) hypertension: Secondary | ICD-10-CM

## 2024-02-09 DIAGNOSIS — E119 Type 2 diabetes mellitus without complications: Secondary | ICD-10-CM | POA: Diagnosis not present

## 2024-02-09 DIAGNOSIS — Z7984 Long term (current) use of oral hypoglycemic drugs: Secondary | ICD-10-CM

## 2024-02-09 LAB — HEMOGLOBIN A1C: Hgb A1c MFr Bld: 12.6 % — ABNORMAL HIGH (ref 4.6–6.5)

## 2024-02-09 MED ORDER — BLOOD PRESSURE KIT: 1.0000 | PACK | Freq: Every day | 0 refills | Status: AC

## 2024-02-09 MED ORDER — EMPAGLIFLOZIN 10 MG PO TABS: 10.0000 mg | ORAL_TABLET | Freq: Every day | ORAL | 3 refills | Status: DC

## 2024-02-09 NOTE — Assessment & Plan Note (Signed)
 Blood pressure is at goal for age and co-morbidities.   Recommendations: continue amlodipine  10 mg daily, benazepril -hctz 20-12.5 mg daily  - BP goal <130/80 - monitor and log blood pressures at home - check around the same time each day in a relaxed setting - Limit salt to <2000 mg/day - Follow DASH eating plan (heart healthy diet) - limit alcohol to 2 standard drinks per day for men and 1 per day for women - avoid tobacco products - get at least 2 hours of regular aerobic exercise weekly Patient aware of signs/symptoms requiring further/urgent evaluation. Labs ordered  For dizziness - consider taking amlodipine  at night instead of both BP pills in the morning; monitor BP morning and evening, and when feeling bad - please also check your glucose when you feel dizzy and keep me updated. Make sure you are eating regularly and staying hydrated.

## 2024-02-09 NOTE — Assessment & Plan Note (Signed)
-   Instruct to start Jardiance  as prescribed. - Recheck A1c today. - Plan to recheck urine microalbumin in three months. - Monitor blood glucose when dizziness occurs.

## 2024-02-09 NOTE — Progress Notes (Signed)
 Established Patient Office Visit  Subjective   Patient ID: [name], female    DOB: [birth year]  Age: 48 y.o. MRN: [id number]  Chief Complaint  Patient presents with   Medical Management of Chronic Issues    HPI  Patient is here for 16-month follow-up.   Hypertension: - Medications: amlodipine  10 mg daily, benazepril -hctz 20-12.5 mg daily  - Compliance: good - Checking BP at home: no - Denies any SOB, recurrent headaches, CP, vision changes, LE edema, palpitations, or medication side effects. - She has noticed some dizziness around 7:30am on mornings when at work - she has not been checking her BP or CBG but symptoms resolved after sitting for a few minutes and drinking some water. She takes all of her meds in the morning on an empty stomach and does not eat or drink anything until about 8 or 9 am on work days.    Diabetes: - Checking glucose at home: no - Medications: glipizide  5 mg BID, Jardiance  10 mg daily, rosuvastatin  10 mg daily   - Compliance: non compliant - only taking glipizide  once a day and has not started Jardiance   - Diet: general - Exercise: minimal - Eye exam: pt to schedule - Foot exam: today - Microalbumin: 11/03/23 (microalbumin/creatinine ratio 137.7, started Jardiance ) - Denies symptoms of hypoglycemia, polyuria, polydipsia, numbness extremities, foot ulcers/trauma, wounds that are not healing, medication side effects  Lab Results  Component Value Date   HGBA1C 11.5 (H) 11/03/2023    Iron  deficiency: - Following with hematology for iron  infusions. Last appointment with them on 01/19/24, stable. - no new symptoms         ROS All review of systems negative except what is listed in the HPI    Objective:     BP 128/89   Pulse (!) 106   Ht 5' 7 (1.702 m)   Wt 252 lb (114.3 kg)   SpO2 99%   BMI 39.47 kg/m    Physical Exam Vitals reviewed.  Constitutional:      Appearance: Normal appearance. She is obese.   Cardiovascular:      Rate and Rhythm: Normal rate and regular rhythm.  Pulmonary:     Effort: Pulmonary effort is normal.     Breath sounds: Normal breath sounds.   Musculoskeletal:     Comments: Mild non-pitting BLE edema, no skin changes   Skin:    General: Skin is warm and dry.   Neurological:     Mental Status: She is alert and oriented to person, place, and time.   Psychiatric:        Mood and Affect: Mood normal.        Behavior: Behavior normal.        Thought Content: Thought content normal.        Judgment: Judgment normal.      No results found for any visits on 02/09/24.    The 10-year ASCVD risk score (Arnett DK, et al., 2019) is: 9.4%    Assessment & Plan:   Problem List Items Addressed This Visit       Active Problems   Hypertension (Chronic)   Blood pressure is at goal for age and co-morbidities.   Recommendations: continue amlodipine  10 mg daily, benazepril -hctz 20-12.5 mg daily  - BP goal <130/80 - monitor and log blood pressures at home - check around the same time each day in a relaxed setting - Limit salt to <2000 mg/day - Follow DASH eating plan (heart healthy  diet) - limit alcohol to 2 standard drinks per day for men and 1 per day for women - avoid tobacco products - get at least 2 hours of regular aerobic exercise weekly Patient aware of signs/symptoms requiring further/urgent evaluation. Labs ordered  For dizziness - consider taking amlodipine  at night instead of both BP pills in the morning; monitor BP morning and evening, and when feeling bad - please also check your glucose when you feel dizzy and keep me updated. Make sure you are eating regularly and staying hydrated.        Relevant Medications   Blood Pressure KIT   Diabetes mellitus type II, non insulin  dependent (HCC) - Primary (Chronic)   - Instruct to start Jardiance  as prescribed. - Recheck A1c today. - Plan to recheck urine microalbumin in three months. - Monitor blood glucose when dizziness  occurs.      Relevant Medications   empagliflozin  (JARDIANCE ) 10 MG TABS tablet   Other Relevant Orders   Hemoglobin A1c     Return in about 3 months (around 05/11/2024) for chronic disease management (DM, HTN - urine microalbumin).    Everlina Hock, NP

## 2024-02-12 ENCOUNTER — Ambulatory Visit: Payer: Self-pay | Admitting: Family Medicine

## 2024-03-15 ENCOUNTER — Ambulatory Visit: Admitting: Family Medicine

## 2024-04-23 ENCOUNTER — Inpatient Hospital Stay

## 2024-04-23 ENCOUNTER — Ambulatory Visit: Admitting: Family

## 2024-04-26 ENCOUNTER — Inpatient Hospital Stay: Admitting: Family

## 2024-04-26 ENCOUNTER — Inpatient Hospital Stay: Attending: Family

## 2024-05-04 ENCOUNTER — Other Ambulatory Visit: Payer: Self-pay | Admitting: Family Medicine

## 2024-05-04 DIAGNOSIS — I10 Essential (primary) hypertension: Secondary | ICD-10-CM

## 2024-05-10 ENCOUNTER — Encounter: Payer: Self-pay | Admitting: Family

## 2024-05-13 ENCOUNTER — Ambulatory Visit: Admitting: Family Medicine

## 2024-05-16 ENCOUNTER — Ambulatory Visit: Admitting: Family Medicine

## 2024-05-16 ENCOUNTER — Encounter: Payer: Self-pay | Admitting: Family Medicine

## 2024-05-16 ENCOUNTER — Other Ambulatory Visit (HOSPITAL_COMMUNITY)
Admission: RE | Admit: 2024-05-16 | Discharge: 2024-05-16 | Disposition: A | Source: Ambulatory Visit | Attending: Family Medicine | Admitting: Family Medicine

## 2024-05-16 VITALS — BP 128/86 | HR 87 | Ht 67.0 in | Wt 244.0 lb

## 2024-05-16 DIAGNOSIS — Z01411 Encounter for gynecological examination (general) (routine) with abnormal findings: Secondary | ICD-10-CM

## 2024-05-16 DIAGNOSIS — N898 Other specified noninflammatory disorders of vagina: Secondary | ICD-10-CM

## 2024-05-16 DIAGNOSIS — Z1231 Encounter for screening mammogram for malignant neoplasm of breast: Secondary | ICD-10-CM

## 2024-05-16 DIAGNOSIS — Z124 Encounter for screening for malignant neoplasm of cervix: Secondary | ICD-10-CM | POA: Diagnosis present

## 2024-05-16 DIAGNOSIS — I10 Essential (primary) hypertension: Secondary | ICD-10-CM | POA: Diagnosis not present

## 2024-05-16 DIAGNOSIS — E119 Type 2 diabetes mellitus without complications: Secondary | ICD-10-CM | POA: Diagnosis not present

## 2024-05-16 DIAGNOSIS — E66813 Obesity, class 3: Secondary | ICD-10-CM | POA: Diagnosis not present

## 2024-05-16 DIAGNOSIS — Z6841 Body Mass Index (BMI) 40.0 and over, adult: Secondary | ICD-10-CM

## 2024-05-16 LAB — MICROALBUMIN / CREATININE URINE RATIO
Creatinine,U: 68.4 mg/dL
Microalb Creat Ratio: 13.3 mg/g (ref 0.0–30.0)
Microalb, Ur: 0.9 mg/dL (ref 0.0–1.9)

## 2024-05-16 NOTE — Progress Notes (Signed)
 Established Patient Office Visit  Subjective   Patient ID: [name], female    DOB: 1975-12-29  Age: 48 y.o. MRN: [id number]  Chief Complaint  Patient presents with   Medical Management of Chronic Issues    HPI  Patient is here for 20-month follow-up.   Discussed the use of AI scribe software for clinical note transcription with the patient, who gave verbal consent to proceed.  History of Present Illness [name] is a 48 year old female with hypertension and diabetes who presents for a three-month follow-up.  Her blood pressure has been stable at home with no extreme highs or lows. She is currently taking Amlodipine  10 mg and a combination of Benazepril  and Hydrochlorothiazide  20/12.5 mg.  In terms of diabetes management, she is not checking her blood sugars at home. She is on Jardiance  10 mg and Glipizide  5 mg twice daily, with no notable side effects. Her A1c levels were previously elevated, and she is due for re-evaluation.   She has developed 'little fine bumps' and thick white discharge in the genital area over the past week, accompanied by significant itching but no odor or pain. She has used over-the-counter treatments for yeast infections, including a seven-day cream regimen and a 14-day oral antifungal, which were ineffective. No sexual activity for the past eleven years. No drainage, odor, or pain associated with the genital bumps, but reports itching and a burning sensation around the area.  She mentions a knot in her abdomen first noticed two years ago, which has decreased in size over time and does not cause pain.[name]    Hypertension: - Medications: amlodipine  10 mg daily, benazepril -hctz 20-12.5 mg daily  - Compliance: good - Checking BP at home: no - Denies any SOB, recurrent headaches, CP, vision changes, LE edema, palpitations, or medication side effects.   Diabetes: - Checking glucose at home: no - Medications: glipizide  5 mg BID, Jardiance  10 mg  daily, rosuvastatin  10 mg daily   - Compliance: good - Diet: general - Exercise: minimal - Eye exam: pt to schedule - Foot exam: today - Microalbumin: 11/03/23 (microalbumin/creatinine ratio 137.7, started Jardiance ) - Denies symptoms of hypoglycemia, polyuria, polydipsia, numbness extremities, foot ulcers/trauma, wounds that are not healing, medication side effects  Lab Results  Component Value Date   HGBA1C 12.6 (H) 02/09/2024    Iron  deficiency: - Following with hematology for iron  infusions. Last appointment with them on 01/19/24, stable. - no new symptoms       ROS All review of systems negative except what is listed in the HPI    Objective:     BP 128/86   Pulse 87   Ht 5' 7 (1.702 m)   Wt 244 lb (110.7 kg)   SpO2 100%   BMI 38.22 kg/m    Physical Exam Vitals reviewed. Exam conducted with a chaperone present.  Constitutional:      Appearance: Normal appearance. She is obese.  Cardiovascular:     Rate and Rhythm: Normal rate and regular rhythm.  Pulmonary:     Effort: Pulmonary effort is normal.     Breath sounds: Normal breath sounds.  Genitourinary:    Exam position: Lithotomy position.     Pubic Area: No rash.      Vagina: Vaginal discharge present.     Cervix: Discharge and friability present.     Uterus: Not enlarged and not tender.      Adnexa:        Right: No tenderness  or fullness.         Left: No tenderness or fullness.        Comments: Thick white/yellow discharge Irregularity to cervix ?polyp (large) Skin:    General: Skin is warm and dry.     Comments: Reports she feels a small knot in the skin at RUQ/rib area that has gotten smaller over the years; nothing significant on palpation, possibly small cyst vs scar tissue  Neurological:     Mental Status: She is alert and oriented to person, place, and time.  Psychiatric:        Mood and Affect: Mood normal.        Behavior: Behavior normal.        Thought Content: Thought content  normal.        Judgment: Judgment normal.      No results found for any visits on 05/16/24.    The 10-year ASCVD risk score (Arnett DK, et al., 2019) is: 9.4%    Assessment & Plan:   Problem List Items Addressed This Visit       Active Problems   Hypertension (Chronic)   Blood pressure is at goal for age and co-morbidities.   Recommendations: continue amlodipine  10 mg daily, benazepril -hctz 20-12.5 mg daily  - BP goal <130/80 - monitor and log blood pressures at home - check around the same time each day in a relaxed setting - Limit salt to <2000 mg/day - Follow DASH eating plan (heart healthy diet) - limit alcohol to 2 standard drinks per day for men and 1 per day for women - avoid tobacco products - get at least 2 hours of regular aerobic exercise weekly Patient aware of signs/symptoms requiring further/urgent evaluation.        Obesity (Chronic)   Labs ordered  Healhty lifestyle encouraged      Diabetes mellitus type II, non insulin  dependent (HCC) - Primary (Chronic)   - Continue current meds - Recheck A1c today. - Low carb diet and regular exercise encouraged      Relevant Orders   Hemoglobin A1c   Microalbumin / creatinine urine ratio   Comprehensive metabolic panel with GFR   Other Visit Diagnoses       Encounter for screening mammogram for malignant neoplasm of breast       Relevant Orders   MM 3D SCREENING MAMMOGRAM BILATERAL BREAST   Comprehensive metabolic panel with GFR     Screening for cervical cancer       Relevant Orders   Cytology - PAP( [place])     Vaginal itching     - Swab today   Relevant Orders   Cytology - PAP( [place])   Cervicovaginal ancillary only     Abnormal female pelvic exam     Abnormality to cervix, suspect polyp, but prefer to let GYN take a look to confirm. Unsure if we were able to get a good sample for pap smear, we will see what results say and let GYN repeat if indicated. Referral placed.   Relevant  Orders   Ambulatory referral to Gynecology         Return in about 3 months (around 08/15/2024) for chronic disease management.    [name], NP

## 2024-05-16 NOTE — Assessment & Plan Note (Signed)
 Labs ordered  Healhty lifestyle encouraged

## 2024-05-16 NOTE — Assessment & Plan Note (Signed)
-   Continue current meds - Recheck A1c today. - Low carb diet and regular exercise encouraged

## 2024-05-16 NOTE — Assessment & Plan Note (Signed)
 Blood pressure is at goal for age and co-morbidities.   Recommendations: continue amlodipine  10 mg daily, benazepril -hctz 20-12.5 mg daily  - BP goal <130/80 - monitor and log blood pressures at home - check around the same time each day in a relaxed setting - Limit salt to <2000 mg/day - Follow DASH eating plan (heart healthy diet) - limit alcohol to 2 standard drinks per day for men and 1 per day for women - avoid tobacco products - get at least 2 hours of regular aerobic exercise weekly Patient aware of signs/symptoms requiring further/urgent evaluation.

## 2024-05-17 LAB — COMPREHENSIVE METABOLIC PANEL WITH GFR
ALT: 12 U/L (ref 0–35)
AST: 15 U/L (ref 0–37)
Albumin: 4.3 g/dL (ref 3.5–5.2)
Alkaline Phosphatase: 71 U/L (ref 39–117)
BUN: 12 mg/dL (ref 6–23)
CO2: 31 meq/L (ref 19–32)
Calcium: 10.4 mg/dL (ref 8.4–10.5)
Chloride: 100 meq/L (ref 96–112)
Creatinine, Ser: 0.88 mg/dL (ref 0.40–1.20)
GFR: 77.62 mL/min (ref >60.00)
Glucose, Bld: 128 mg/dL — ABNORMAL HIGH (ref 70–99)
Potassium: 4.2 meq/L (ref 3.5–5.1)
Sodium: 140 meq/L (ref 135–145)
Total Bilirubin: 0.3 mg/dL (ref 0.2–1.2)
Total Protein: 7.5 g/dL (ref 6.0–8.3)

## 2024-05-17 LAB — CERVICOVAGINAL ANCILLARY ONLY
Bacterial Vaginitis (gardnerella): NEGATIVE
Candida Glabrata: NEGATIVE
Candida Vaginitis: POSITIVE — AB
Comment: NEGATIVE
Comment: NEGATIVE
Comment: NEGATIVE

## 2024-05-17 LAB — HEMOGLOBIN A1C: Hgb A1c MFr Bld: 10.3 % — ABNORMAL HIGH (ref 4.6–6.5)

## 2024-05-20 ENCOUNTER — Ambulatory Visit: Payer: Self-pay | Admitting: Family Medicine

## 2024-05-20 DIAGNOSIS — E119 Type 2 diabetes mellitus without complications: Secondary | ICD-10-CM

## 2024-05-20 DIAGNOSIS — B3731 Acute candidiasis of vulva and vagina: Secondary | ICD-10-CM

## 2024-05-20 LAB — CYTOLOGY - PAP
Chlamydia: NEGATIVE
Comment: NEGATIVE
Comment: NEGATIVE
Comment: NEGATIVE
Comment: NORMAL
Diagnosis: NEGATIVE
High risk HPV: NEGATIVE
Neisseria Gonorrhea: NEGATIVE
Trichomonas: NEGATIVE

## 2024-05-20 MED ORDER — FLUCONAZOLE 150 MG PO TABS: 150.0000 mg | ORAL_TABLET | Freq: Every day | ORAL | 0 refills | Status: DC

## 2024-05-20 MED ORDER — EMPAGLIFLOZIN 25 MG PO TABS: 25.0000 mg | ORAL_TABLET | Freq: Every day | ORAL | 1 refills | Status: DC

## 2024-08-04 ENCOUNTER — Other Ambulatory Visit: Payer: Self-pay | Admitting: Family Medicine

## 2024-08-04 DIAGNOSIS — I10 Essential (primary) hypertension: Secondary | ICD-10-CM

## 2024-08-19 NOTE — Progress Notes (Incomplete)
 "  Established Patient Office Visit  Subjective:  Patient ID: [name], female    DOB: 1976/03/07  Age: 48 y.o. MRN: [id number]  CC: No chief complaint on file.     HPI [name] is here for 36-month follow up.   Hypertension: - Medications: amlodipine  10 mg daily, benazepril -hctz 20-12.5 mg daily  - Compliance: good - Checking BP at home: no - Denies any SOB, recurrent headaches, CP, vision changes, LE edema, palpitations, or medication side effects.     Diabetes; HLD: - Checking glucose at home: no - Medications: glipizide  5 mg BID, Jardiance  25 mg daily, rosuvastatin  10 mg daily   - Compliance: good - Diet: general - Exercise: minimal - Eye exam: pt to schedule - Foot exam: UTD, 05/16/2024.  - Microalbumin: 11/03/23 (microalbumin/creatinine ratio 137.7, started Jardiance ) - Denies symptoms of hypoglycemia, polyuria, polydipsia, numbness extremities, foot ulcers/trauma, wounds that are not healing, medication side effects  The 10-year ASCVD risk score (Arnett DK, et al., 2019) is: 9.4%   Values used to calculate the score:     Age: 48 years     Clinically relevant sex: Female     Is Non-Hispanic African American: Yes     Diabetic: Yes     Tobacco smoker: No     Systolic Blood Pressure: 128 mmHg     Is BP treated: Yes     HDL Cholesterol: 38.8 mg/dL     Total Cholesterol: 156 mg/dL  Lab Results  Component Value Date   HGBA1C 10.3 (H) 05/16/2024      Iron  deficiency: - Following with hematology for iron  infusions. Last appointment with them on 01/19/24, stable. Lab Results  Component Value Date   WBC 5.3 01/19/2024   HGB 11.9 (L) 01/19/2024   HCT 38.4 01/19/2024   MCV 74.6 (L) 01/19/2024   PLT PLATELET CLUMPS NOTED ON SMEAR, UNABLE TO ESTIMATE 01/19/2024         Past Medical History:  Diagnosis Date   Anemia    Arthritis    Back pain    lumbar    Breast cancer (HCC) 08/22/2010   right   Cough 09/07/2012   Depression    no current med.    Diabetes mellitus without complication (HCC)    Gastric ulcer    GERD (gastroesophageal reflux disease)    H/O scoliosis    Headache(784.0)    hx. migraine in childhood   Hypertension    under control with med., has been on med. x 5 yr.   Peripheral neuropathy    s/p chemotherapy   S/P radiation therapy 12/19/11 - 02/03/12   Right Chest Wall and Axilla   Status post chemotherapy    finished 08/2011    Past Surgical History:  Procedure Laterality Date   ESOPHAGOGASTRODUODENOSCOPY (EGD) WITH PROPOFOL   09/06/2012   Procedure: ESOPHAGOGASTRODUODENOSCOPY (EGD) WITH PROPOFOL ;  Surgeon: Toribio SHAUNNA Cedar, MD;  Location: [facility];  Service: Endoscopy;  Laterality: N/A;   HIP PINNING  age 69   bilateral   MASTECTOMY W/ SENTINEL NODE BIOPSY  10/14/2011   Procedure: MASTECTOMY WITH SENTINEL LYMPH NODE BIOPSY;  Surgeon: Deward GORMAN Curvin DOUGLAS, MD;  Location: [facility];  Service: General;  Laterality: Right;   OPEN REDUCTION INTERNAL FIXATION (ORIF) PROXIMAL PHALANX Left 11/14/2022   Procedure: OPEN REDUCTION INTERNAL FIXATION (ORIF) left small finger;  Surgeon: Shari Easter, MD;  Location: [facility];  Service: Orthopedics;  Laterality: Left;  regional with iv sedation   PORT-A-CATH REMOVAL  09/12/2012   Procedure: REMOVAL PORT-A-CATH;  Surgeon: Deward GORMAN Curvin DOUGLAS, MD;  Location: [facility];  Service: General;  Laterality: Left;   PORTACATH PLACEMENT  04/08/2011   WOUND EXPLORATION  10/14/2011   right mastectomy wound    Family History  Problem Relation Age of Onset   Heart disease Father        heart attack   Heart attack Father        died age 37   Hypertension Father    Hypertension Mother    Anemia Mother    Diabetes Mother    Stroke Mother    Aneurysm Mother        brain   Heart disease Maternal Grandmother        MI   Hypertension Maternal Grandmother    Diabetes Maternal Grandmother    Hypertension Brother    Diabetes Brother    Hypertension Paternal Grandmother    Diabetes  Paternal Grandmother     Social History   Socioeconomic History   Marital status: Married    Spouse name: Not on file   Number of children: 2   Years of education: Not on file   Highest education level: Not on file  Occupational History    Employer: [employer]  Tobacco Use   Smoking status: Never   Smokeless tobacco: Never  Vaping Use   Vaping status: Never Used  Substance and Sexual Activity   Alcohol use: Yes    Comment: rarely   Drug use: No   Sexual activity: Not Currently    Comment: menerache age 75, GxP2,1st pregnancy age 74, 1 miscarriage  Other Topics Concern   Not on file  Social History Narrative   Not on file   Social Drivers of Health   Tobacco Use: Low Risk (05/16/2024)   Patient History    Smoking Tobacco Use: Never    Smokeless Tobacco Use: Never    Passive Exposure: Not on file  Financial Resource Strain: Not on file  Food Insecurity: Not on file  Transportation Needs: Not on file  Physical Activity: Not on file  Stress: Not on file  Social Connections: Not on file  Intimate Partner Violence: Not on file  Depression (EYV7-0): Medium Risk (05/16/2024)   Depression (PHQ2-9)    PHQ-2 Score: 9  Alcohol Screen: Not on file  Housing: Not on file  Utilities: Not on file  Health Literacy: Not on file    ROS All ROS negative except what is listed in the HPI.   Objective:   Today's Vitals: There were no vitals taken for this visit.  Physical Exam  Assessment & Plan:   Problem List Items Addressed This Visit   None     Follow-up: No follow-ups on file.   Annette FURY Almarie, DNP, FNP-C  I,[name],acting as a neurosurgeon for Annette KATHEE Almarie, NP.,have documented all relevant documentation on the behalf of Annette KATHEE Almarie, NP.  I, Annette KATHEE Almarie, NP, have reviewed all documentation for this visit. The documentation on 08/23/2024 for the exam, diagnosis, procedures, and orders are all accurate and complete. "

## 2024-08-23 ENCOUNTER — Ambulatory Visit: Admitting: Family Medicine

## 2024-09-02 NOTE — Progress Notes (Signed)
 "  Established Patient Office Visit  Subjective:  Patient ID: [name], female    DOB: [birth year]  Age: 49 y.o. MRN: [id number]  CC: No chief complaint on file.     HPI AMARE KONTOS is here for 16-month follow up.   Hypertension: - Medications: amlodipine  10 mg daily, benazepril -hctz 20-12.5 mg daily  - Compliance: good - Checking BP at home: no - Denies any SOB, recurrent headaches, CP, vision changes, LE edema, palpitations, or medication side effects. BP Readings from Last 3 Encounters:  05/16/24 128/86  02/09/24 128/89  01/19/24 119/76        Diabetes; HLD: - Checking glucose at home: no - Medications: glipizide  5 mg BID, Jardiance  25 mg daily, rosuvastatin  10 mg daily   - Compliance: good - Diet: general - Exercise: minimal - Eye exam: pt to schedule - Foot exam: UTD, 05/16/2024.  - Microalbumin: 11/03/23 (microalbumin/creatinine ratio 137.7, started Jardiance ) - Denies symptoms of hypoglycemia, polyuria, polydipsia, numbness extremities, foot ulcers/trauma, wounds that are not healing, medication side effects  The 10-year ASCVD risk score (Arnett DK, et al., 2019) is: 9.4%   Values used to calculate the score:     Age: 13 years     Clinically relevant sex: Female     Is Non-Hispanic African American: Yes     Diabetic: Yes     Tobacco smoker: No     Systolic Blood Pressure: 128 mmHg     Is BP treated: Yes     HDL Cholesterol: 38.8 mg/dL     Total Cholesterol: 156 mg/dL  Lab Results  Component Value Date   HGBA1C 10.3 (H) 05/16/2024      Iron  deficiency: - Following with hematology for iron  infusions. Last appointment with them on 01/19/24, stable.    Latest Ref Rng & Units 01/19/2024    9:45 AM 11/03/2023   10:00 AM 11/14/2022    6:18 AM  CBC  WBC 4.0 - 10.5 K/uL 5.3  6.5  5.8   Hemoglobin 12.0 - 15.0 g/dL 88.0  9.8  89.9   Hematocrit 36.0 - 46.0 % 38.4  32.7  34.3   Platelets 150 - 400 K/uL PLATELET CLUMPS NOTED ON SMEAR, UNABLE TO ESTIMATE  497.0   397       Past Medical History:  Diagnosis Date   Anemia    Arthritis    Back pain    lumbar    Breast cancer (HCC) 08/22/2010   right   Cough 09/07/2012   Depression    no current med.   Diabetes mellitus without complication (HCC)    Gastric ulcer    GERD (gastroesophageal reflux disease)    H/O scoliosis    Headache(784.0)    hx. migraine in childhood   Hypertension    under control with med., has been on med. x 5 yr.   Peripheral neuropathy    s/p chemotherapy   S/P radiation therapy 12/19/11 - 02/03/12   Right Chest Wall and Axilla   Status post chemotherapy    finished 08/2011    Past Surgical History:  Procedure Laterality Date   ESOPHAGOGASTRODUODENOSCOPY (EGD) WITH PROPOFOL   09/06/2012   Procedure: ESOPHAGOGASTRODUODENOSCOPY (EGD) WITH PROPOFOL ;  Surgeon: Toribio SHAUNNA Cedar, MD;  Location: [facility];  Service: Endoscopy;  Laterality: N/A;   HIP PINNING  age 55   bilateral   MASTECTOMY W/ SENTINEL NODE BIOPSY  10/14/2011   Procedure: MASTECTOMY WITH SENTINEL LYMPH NODE BIOPSY;  Surgeon: Deward GORMAN Curvin DOUGLAS, MD;  Location: [facility];  Service: General;  Laterality: Right;   OPEN REDUCTION INTERNAL FIXATION (ORIF) PROXIMAL PHALANX Left 11/14/2022   Procedure: OPEN REDUCTION INTERNAL FIXATION (ORIF) left small finger;  Surgeon: Shari Easter, MD;  Location: [facility];  Service: Orthopedics;  Laterality: Left;  regional with iv sedation   PORT-A-CATH REMOVAL  09/12/2012   Procedure: REMOVAL PORT-A-CATH;  Surgeon: Deward GORMAN Curvin DOUGLAS, MD;  Location: [facility];  Service: General;  Laterality: Left;   PORTACATH PLACEMENT  04/08/2011   WOUND EXPLORATION  10/14/2011   right mastectomy wound    Family History  Problem Relation Age of Onset   Heart disease Father        heart attack   Heart attack Father        died age 34   Hypertension Father    Hypertension Mother    Anemia Mother    Diabetes Mother    Stroke Mother    Aneurysm Mother        brain   Heart disease  Maternal Grandmother        MI   Hypertension Maternal Grandmother    Diabetes Maternal Grandmother    Hypertension Brother    Diabetes Brother    Hypertension Paternal Grandmother    Diabetes Paternal Grandmother     Social History   Socioeconomic History   Marital status: Married    Spouse name: Not on file   Number of children: 2   Years of education: Not on file   Highest education level: Not on file  Occupational History    Employer: [employer]  Tobacco Use   Smoking status: Never   Smokeless tobacco: Never  Vaping Use   Vaping status: Never Used  Substance and Sexual Activity   Alcohol use: Yes    Comment: rarely   Drug use: No   Sexual activity: Not Currently    Comment: menerache age 70, GxP2,1st pregnancy age 52, 1 miscarriage  Other Topics Concern   Not on file  Social History Narrative   Not on file   Social Drivers of Health   Tobacco Use: Low Risk (05/16/2024)   Patient History    Smoking Tobacco Use: Never    Smokeless Tobacco Use: Never    Passive Exposure: Not on file  Financial Resource Strain: Not on file  Food Insecurity: Not on file  Transportation Needs: Not on file  Physical Activity: Not on file  Stress: Not on file  Social Connections: Not on file  Intimate Partner Violence: Not on file  Depression (EYV7-0): Medium Risk (05/16/2024)   Depression (PHQ2-9)    PHQ-2 Score: 9  Alcohol Screen: Not on file  Housing: Not on file  Utilities: Not on file  Health Literacy: Not on file    ROS All ROS negative except what is listed in the HPI.   Objective:   Today's Vitals: There were no vitals taken for this visit.  Physical Exam  Assessment & Plan:   Problem List Items Addressed This Visit   None     Follow-up: No follow-ups on file.   Waddell FURY Almarie, DNP, FNP-C  I,Emily Lagle,acting as a neurosurgeon for Waddell KATHEE Almarie, NP.,have documented all relevant documentation on the behalf of Waddell KATHEE Almarie, NP.  I, Waddell KATHEE Almarie, NP,  have reviewed all documentation for this visit. The documentation on 09/06/2024 for the exam, diagnosis, procedures, and orders are all accurate and complete. "

## 2024-09-06 ENCOUNTER — Other Ambulatory Visit (HOSPITAL_COMMUNITY)
Admission: RE | Admit: 2024-09-06 | Discharge: 2024-09-06 | Disposition: A | Source: Ambulatory Visit | Attending: Family Medicine | Admitting: Family Medicine

## 2024-09-06 ENCOUNTER — Encounter: Payer: Self-pay | Admitting: Family Medicine

## 2024-09-06 ENCOUNTER — Ambulatory Visit: Admitting: Family Medicine

## 2024-09-06 VITALS — BP 126/71 | HR 102 | Ht 67.0 in | Wt 245.0 lb

## 2024-09-06 DIAGNOSIS — Z7984 Long term (current) use of oral hypoglycemic drugs: Secondary | ICD-10-CM | POA: Diagnosis not present

## 2024-09-06 DIAGNOSIS — E119 Type 2 diabetes mellitus without complications: Secondary | ICD-10-CM | POA: Diagnosis not present

## 2024-09-06 DIAGNOSIS — I10 Essential (primary) hypertension: Secondary | ICD-10-CM

## 2024-09-06 DIAGNOSIS — D500 Iron deficiency anemia secondary to blood loss (chronic): Secondary | ICD-10-CM | POA: Diagnosis not present

## 2024-09-06 DIAGNOSIS — N898 Other specified noninflammatory disorders of vagina: Secondary | ICD-10-CM

## 2024-09-06 DIAGNOSIS — K219 Gastro-esophageal reflux disease without esophagitis: Secondary | ICD-10-CM | POA: Diagnosis not present

## 2024-09-06 MED ORDER — AMLODIPINE BESYLATE 10 MG PO TABS: 10.0000 mg | ORAL_TABLET | Freq: Every day | ORAL | 1 refills | Status: AC

## 2024-09-06 MED ORDER — OMEPRAZOLE 20 MG PO CPDR: 20.0000 mg | DELAYED_RELEASE_CAPSULE | Freq: Every morning | ORAL | 1 refills | Status: AC

## 2024-09-06 MED ORDER — BENAZEPRIL-HYDROCHLOROTHIAZIDE 20-12.5 MG PO TABS: 1.0000 | ORAL_TABLET | Freq: Every day | ORAL | 1 refills | Status: AC

## 2024-09-06 MED ORDER — ROSUVASTATIN CALCIUM 10 MG PO TABS: 10.0000 mg | ORAL_TABLET | Freq: Every morning | ORAL | 1 refills | Status: AC

## 2024-09-06 MED ORDER — GLIPIZIDE 5 MG PO TABS: 5.0000 mg | ORAL_TABLET | Freq: Two times a day (BID) | ORAL | 1 refills | Status: AC

## 2024-09-06 MED ORDER — FLUCONAZOLE 150 MG PO TABS: 150.0000 mg | ORAL_TABLET | Freq: Every day | ORAL | 0 refills | Status: AC

## 2024-09-06 MED ORDER — EMPAGLIFLOZIN 25 MG PO TABS: 25.0000 mg | ORAL_TABLET | Freq: Every day | ORAL | 1 refills | Status: AC

## 2024-09-06 NOTE — Assessment & Plan Note (Signed)
 Lifestyle measures for reflux: - Avoid meals or carbonated beverages within 3 hours of bedtime - Minimize intake of fried, fatty, and spicy foods (this will help decrease gastric acid production) - Raise the head of the bed using 4-6 inch blocks (especially if symptoms are present at night) - Maintain a healthy weight and avoid tight fitting clothes, especially around the waist  - Avoid foods that relax the sphincter or worsen symptoms (chocolate, peppermint, fatty foods, citrus, spicy foods, tomatoes, coffee, caffeine)  - Minimize use of NSAIDs (ibuprofen, Aleve, etc), nicotine, and alcohol

## 2024-09-06 NOTE — Assessment & Plan Note (Signed)
-   Continue current meds - Recheck A1c today. - Low carb diet and regular exercise encouraged

## 2024-09-06 NOTE — Assessment & Plan Note (Signed)
 Blood pressure is at goal for age and co-morbidities.   Recommendations: continue amlodipine  10 mg daily, benazepril -hctz 20-12.5 mg daily  - BP goal <130/80 - monitor and log blood pressures at home - check around the same time each day in a relaxed setting - Limit salt to <2000 mg/day - Follow DASH eating plan (heart healthy diet) - limit alcohol to 2 standard drinks per day for men and 1 per day for women - avoid tobacco products - get at least 2 hours of regular aerobic exercise weekly Patient aware of signs/symptoms requiring further/urgent evaluation.

## 2024-09-06 NOTE — Assessment & Plan Note (Signed)
 Last iron  infusion last summer, no recent hematology follow-up. - Ordered blood work to check iron  levels. - Return to hematology for possible iron  infusion if levels are low.

## 2024-09-07 LAB — BASIC METABOLIC PANEL WITH GFR
BUN: 16 mg/dL (ref 7–25)
CO2: 26 mmol/L (ref 20–32)
Calcium: 9.2 mg/dL (ref 8.6–10.2)
Chloride: 105 mmol/L (ref 98–110)
Creat: 0.9 mg/dL (ref 0.50–0.99)
Glucose, Bld: 319 mg/dL — ABNORMAL HIGH (ref 65–99)
Potassium: 4.2 mmol/L (ref 3.5–5.3)
Sodium: 140 mmol/L (ref 135–146)
eGFR: 79 mL/min/1.73m2

## 2024-09-07 LAB — CBC WITH DIFFERENTIAL/PLATELET
Absolute Lymphocytes: 1525 {cells}/uL (ref 850–3900)
Absolute Monocytes: 561 {cells}/uL (ref 200–950)
Basophils Absolute: 32 {cells}/uL (ref 0–200)
Basophils Relative: 0.4 %
Eosinophils Absolute: 198 {cells}/uL (ref 15–500)
Eosinophils Relative: 2.5 %
HCT: 40.6 % (ref 35.9–46.0)
Hemoglobin: 12.2 g/dL (ref 11.7–15.5)
MCH: 24.4 pg — ABNORMAL LOW (ref 27.0–33.0)
MCHC: 30 g/dL — ABNORMAL LOW (ref 31.6–35.4)
MCV: 81 fL — ABNORMAL LOW (ref 81.4–101.7)
MPV: 10.1 fL (ref 7.5–12.5)
Monocytes Relative: 7.1 %
Neutro Abs: 5585 {cells}/uL (ref 1500–7800)
Neutrophils Relative %: 70.7 %
Platelets: 479 Thousand/uL — ABNORMAL HIGH (ref 140–400)
RBC: 5.01 Million/uL (ref 3.80–5.10)
RDW: 13.1 % (ref 11.0–15.0)
Total Lymphocyte: 19.3 %
WBC: 7.9 Thousand/uL (ref 3.8–10.8)

## 2024-09-07 LAB — HEMOGLOBIN A1C
Hgb A1c MFr Bld: 9.1 % — ABNORMAL HIGH
Mean Plasma Glucose: 214 mg/dL
eAG (mmol/L): 11.9 mmol/L

## 2024-09-07 LAB — TSH: TSH: 1.79 m[IU]/L

## 2024-09-07 LAB — IRON,TIBC AND FERRITIN PANEL
%SAT: 17 % (ref 16–45)
Ferritin: 35 ng/mL (ref 16–232)
Iron: 64 ug/dL (ref 40–190)
TIBC: 381 ug/dL (ref 250–450)

## 2024-09-09 ENCOUNTER — Ambulatory Visit: Payer: Self-pay | Admitting: Family Medicine

## 2024-09-09 LAB — CERVICOVAGINAL ANCILLARY ONLY
Bacterial Vaginitis (gardnerella): NEGATIVE
Candida Glabrata: NEGATIVE
Candida Vaginitis: POSITIVE — AB
Chlamydia: NEGATIVE
Comment: NEGATIVE
Comment: NEGATIVE
Comment: NEGATIVE
Comment: NEGATIVE
Comment: NEGATIVE
Comment: NORMAL
Neisseria Gonorrhea: NEGATIVE
Trichomonas: NEGATIVE

## 2025-03-07 ENCOUNTER — Encounter: Admitting: Family Medicine
